# Patient Record
Sex: Male | Born: 1960 | Race: Black or African American | Hispanic: No | Smoking: Former smoker
Health system: Southern US, Community
[De-identification: ages and names within clinical notes are randomized; demographics above are authoritative.]

## PROBLEM LIST (undated history)

## (undated) DIAGNOSIS — K7682 Hepatic encephalopathy: Secondary | ICD-10-CM

## (undated) DIAGNOSIS — K7460 Unspecified cirrhosis of liver: Secondary | ICD-10-CM

## (undated) DIAGNOSIS — I629 Nontraumatic intracranial hemorrhage, unspecified: Secondary | ICD-10-CM

## (undated) DIAGNOSIS — I10 Essential (primary) hypertension: Secondary | ICD-10-CM

## (undated) DIAGNOSIS — K7290 Hepatic failure, unspecified without coma: Secondary | ICD-10-CM

## (undated) DIAGNOSIS — R569 Unspecified convulsions: Secondary | ICD-10-CM

---

## 2016-05-16 ENCOUNTER — Emergency Department
Admission: EM | Admit: 2016-05-16 | Discharge: 2016-05-17 | Disposition: A | Discharge status: Other Acute Inpatient Hospital | Payer: Medicaid Other | Attending: Emergency Medicine | Admitting: Emergency Medicine

## 2016-05-16 ENCOUNTER — Emergency Department: Payer: Medicaid Other

## 2016-05-16 DIAGNOSIS — G939 Disorder of brain, unspecified: Secondary | ICD-10-CM | POA: Diagnosis not present

## 2016-05-16 DIAGNOSIS — R569 Unspecified convulsions: Secondary | ICD-10-CM | POA: Insufficient documentation

## 2016-05-16 DIAGNOSIS — Z79899 Other long term (current) drug therapy: Secondary | ICD-10-CM | POA: Diagnosis not present

## 2016-05-16 DIAGNOSIS — I10 Essential (primary) hypertension: Secondary | ICD-10-CM | POA: Insufficient documentation

## 2016-05-16 DIAGNOSIS — J9600 Acute respiratory failure, unspecified whether with hypoxia or hypercapnia: Secondary | ICD-10-CM | POA: Diagnosis not present

## 2016-05-16 DIAGNOSIS — R4182 Altered mental status, unspecified: Secondary | ICD-10-CM | POA: Diagnosis not present

## 2016-05-16 DIAGNOSIS — R55 Syncope and collapse: Secondary | ICD-10-CM | POA: Diagnosis present

## 2016-05-16 HISTORY — DX: Essential (primary) hypertension: I10

## 2016-05-16 LAB — URINALYSIS COMPLETE WITH MICROSCOPIC (ARMC ONLY)
Bilirubin Urine: NEGATIVE
Glucose, UA: NEGATIVE mg/dL
KETONES UR: NEGATIVE mg/dL
Nitrite: NEGATIVE
PROTEIN: 30 mg/dL — AB
SPECIFIC GRAVITY, URINE: 1.011 (ref 1.005–1.030)
SQUAMOUS EPITHELIAL / LPF: NONE SEEN
pH: 7 (ref 5.0–8.0)

## 2016-05-16 LAB — SALICYLATE LEVEL

## 2016-05-16 LAB — COMPREHENSIVE METABOLIC PANEL
ALBUMIN: 1.9 g/dL — AB (ref 3.5–5.0)
ALK PHOS: 61 U/L (ref 38–126)
ALT: 33 U/L (ref 17–63)
ANION GAP: 6 (ref 5–15)
AST: 61 U/L — ABNORMAL HIGH (ref 15–41)
BILIRUBIN TOTAL: 4.4 mg/dL — AB (ref 0.3–1.2)
BUN: 13 mg/dL (ref 6–20)
CALCIUM: 8 mg/dL — AB (ref 8.9–10.3)
CO2: 16 mmol/L — ABNORMAL LOW (ref 22–32)
Chloride: 114 mmol/L — ABNORMAL HIGH (ref 101–111)
Creatinine, Ser: 0.87 mg/dL (ref 0.61–1.24)
GFR calc non Af Amer: >60 mL/min (ref >60)
Glucose, Bld: 103 mg/dL — ABNORMAL HIGH (ref 65–99)
POTASSIUM: 4.4 mmol/L (ref 3.5–5.1)
Sodium: 136 mmol/L (ref 135–145)
TOTAL PROTEIN: 6.8 g/dL (ref 6.5–8.1)

## 2016-05-16 LAB — CBC WITH DIFFERENTIAL/PLATELET
Basophils Absolute: 0.1 10*3/uL (ref 0–0.1)
EOS ABS: 0.2 10*3/uL (ref 0–0.7)
Eosinophils Relative: 2 %
HEMATOCRIT: 23.8 % — AB (ref 40.0–52.0)
HEMOGLOBIN: 7.5 g/dL — AB (ref 13.0–18.0)
LYMPHS ABS: 1.5 10*3/uL (ref 1.0–3.6)
Lymphocytes Relative: 18 %
MCH: 27.5 pg (ref 26.0–34.0)
MCHC: 31.6 g/dL — AB (ref 32.0–36.0)
MCV: 86.8 fL (ref 80.0–100.0)
MONO ABS: 1 10*3/uL (ref 0.2–1.0)
NEUTROS ABS: 5.7 10*3/uL (ref 1.4–6.5)
Platelets: 147 10*3/uL — ABNORMAL LOW (ref 150–440)
RBC: 2.74 MIL/uL — ABNORMAL LOW (ref 4.40–5.90)
RDW: 25.3 % — AB (ref 11.5–14.5)
WBC: 8.3 10*3/uL (ref 3.8–10.6)

## 2016-05-16 LAB — BLOOD GAS, ARTERIAL
ACID-BASE DEFICIT: 8.2 mmol/L — AB (ref 0.0–2.0)
Bicarbonate: 16.6 mEq/L — ABNORMAL LOW (ref 21.0–28.0)
FIO2: 0.5
MECHANICAL RATE: 16
O2 Saturation: 98.2 %
PEEP/CPAP: 5 cmH2O
PH ART: 7.35 (ref 7.350–7.450)
Patient temperature: 37
VT: 500 mL
pCO2 arterial: 30 mmHg — ABNORMAL LOW (ref 32.0–48.0)
pO2, Arterial: 113 mmHg — ABNORMAL HIGH (ref 83.0–108.0)

## 2016-05-16 LAB — PROTIME-INR
INR: 2.15
PROTHROMBIN TIME: 23.8 s — AB (ref 11.4–15.0)

## 2016-05-16 LAB — ETHANOL: Alcohol, Ethyl (B): <5 mg/dL (ref <5)

## 2016-05-16 LAB — LACTIC ACID, PLASMA: LACTIC ACID, VENOUS: 3.1 mmol/L — AB (ref 0.5–2.0)

## 2016-05-16 LAB — BRAIN NATRIURETIC PEPTIDE: B NATRIURETIC PEPTIDE 5: 38 pg/mL (ref 0.0–100.0)

## 2016-05-16 MED ORDER — SUCCINYLCHOLINE CHLORIDE 20 MG/ML IJ SOLN
100.0000 mg | Freq: Once | INTRAMUSCULAR | Status: AC
  Administered 2016-05-16: 100 mg via INTRAVENOUS

## 2016-05-16 MED ORDER — PROPOFOL 1000 MG/100ML IV EMUL
INTRAVENOUS | Status: AC
  Filled 2016-05-16: qty 100

## 2016-05-16 MED ORDER — SODIUM CHLORIDE 0.9 % IV SOLN
Freq: Once | INTRAVENOUS | Status: AC
  Administered 2016-05-16: 21:00:00 via INTRAVENOUS

## 2016-05-16 MED ORDER — FENTANYL 2500MCG IN NS 250ML (10MCG/ML) PREMIX INFUSION
25.0000 ug/h | INTRAVENOUS | Status: DC
  Administered 2016-05-16: 25 ug/h via INTRAVENOUS

## 2016-05-16 MED ORDER — MIDAZOLAM HCL 5 MG/ML IJ SOLN
1.0000 mg/h | INTRAMUSCULAR | Status: DC
  Administered 2016-05-16: 1 mg/h via INTRAVENOUS
  Filled 2016-05-16: qty 10

## 2016-05-16 MED ORDER — ETOMIDATE 2 MG/ML IV SOLN
20.0000 mg | Freq: Once | INTRAVENOUS | Status: AC
  Administered 2016-05-16: 20 mg via INTRAVENOUS

## 2016-05-16 MED ORDER — FENTANYL 2500MCG IN NS 250ML (10MCG/ML) PREMIX INFUSION
INTRAVENOUS | Status: AC
  Administered 2016-05-16: 25 ug/h via INTRAVENOUS
  Filled 2016-05-16: qty 250

## 2016-05-16 MED ORDER — SODIUM CHLORIDE 0.9 % IV SOLN
1000.0000 mg | Freq: Once | INTRAVENOUS | Status: AC
  Administered 2016-05-16: 1000 mg via INTRAVENOUS
  Filled 2016-05-16: qty 10

## 2016-05-16 MED ORDER — PROPOFOL 1000 MG/100ML IV EMUL
INTRAVENOUS | Status: AC
  Administered 2016-05-16: 10 ug/kg/min via INTRAVENOUS
  Filled 2016-05-16: qty 100

## 2016-05-16 MED ORDER — PROPOFOL 1000 MG/100ML IV EMUL
5.0000 ug/kg/min | Freq: Once | INTRAVENOUS | Status: AC
  Administered 2016-05-16: 10 ug/kg/min via INTRAVENOUS

## 2016-05-16 MED ORDER — SODIUM CHLORIDE 0.9 % IV BOLUS (SEPSIS)
1000.0000 mL | Freq: Once | INTRAVENOUS | Status: AC
  Administered 2016-05-16: 1000 mL via INTRAVENOUS

## 2016-05-16 NOTE — ED Notes (Signed)
Report called to 2M05

## 2016-05-16 NOTE — ED Provider Notes (Signed)
[facility] Emergency Department Provider Note   ____________________________________________  Time seen: Approximately 8:00 PM  I have reviewed the triage vital signs and the nursing notes.   HISTORY  Chief Complaint unresponsive     HPI Jon Morgan is a 55 y.o. male who had an episode of possible seizure and when he was brought into the emergency department he had blood coming from his mouth and was unresponsive. Apparently patient was initially awake and became unresponsive on the way per EMS. EMS also stated the patient was having periods of apnea. We don't have any other significant history and patient cannot provide any other information or complaints secondary to patient being unresponsive.   Past Medical History  Diagnosis Date  . Hypertension     There are no active problems to display for this patient.   No past surgical history on file.  Current Outpatient Rx  Name  Route  Sig  Dispense  Refill  . acetaminophen (TYLENOL) 650 MG CR tablet   Oral   Take 650 mg by mouth every 4 (four) hours as needed for pain. (do not exceed 3 grams in 24 hours)         . amLODipine (NORVASC) 10 MG tablet   Oral   Take 10 mg by mouth daily.         . fluticasone (FLONASE) 50 MCG/ACT nasal spray   Each Nare   Place 1 spray into both nostrils daily.         . Multiple Vitamin (MULTIVITAMIN WITH MINERALS) TABS tablet   Oral   Take 1 tablet by mouth daily.         Marland Kitchen omeprazole (PRILOSEC) 20 MG capsule   Oral   Take 20 mg by mouth daily.         . potassium chloride SA (K-DUR,KLOR-CON) 20 MEQ tablet   Oral   Take 20 mEq by mouth daily.         Marland Kitchen thiamine (VITAMIN B-1) 100 MG tablet   Oral   Take 100 mg by mouth daily.           Allergies Review of patient's allergies indicates no known allergies.  No family history on file.  Social History Social History  Substance Use Topics  . Smoking status: None  . Smokeless  tobacco: None  . Alcohol Use: None    Review of Systems Patient unable to give his any other significant review of systems secondary to his altered mental status/unresponsive 10-point ROS otherwise negative.  ____________________________________________   PHYSICAL EXAM:  VITAL SIGNS: ED Triage Vitals  Enc Vitals Group     BP 05/16/16 2000 154/83 mmHg     Pulse Rate 05/16/16 2015 108     Resp 05/16/16 2000 17     Temp 05/16/16 2023 100.3 F (37.9 C)     Temp Source 05/16/16 2023 Rectal     SpO2 05/16/16 2015 87 %     Weight 05/16/16 2001 205 lb 14.6 oz (93.4 kg)     Height --      Head Cir --      Peak Flow --      Pain Score --      Pain Loc --      Pain Edu? --      Excl. in GC? --     Constitutional: Unresponsive even to deep painful stimuli. Patient appears to be breathing fine and protecting his airways some time, and then at  times he is having periods of apnea. tressEyes: Conjunctivae are normal. PERRL. EOMI. Head: Atraumatic. The patient has blood coming from his mouth from where he bit his tongue. n. Nose: No congestion/rhinnorhea. Mouth/Throat: Mucous membranes are moist.  Oropharynx non-erythematous.Patient has very poor dentitio Neck: No stridor.   Cardiovascular: Patient is tachycardic. Grossly normal heart sounds.  Good peripheral circulation. Respiratory: Patient with deep sonorous respirations and periods of apnea with obvious congestion Gastrointestinal: Soft  No distention. No abdominal bruits. No CVA tenderness. Musculoskeletal: No lower extremity tenderness but has some mild edema.  No joint effusions. Neurologic: Recent unresponsive even to deep painful stimuli didn't but does respond to some deep suctioning of his mouth and throat. Skin:  Skin is warm, dry and intact. No rash noted. Psychiatric: Patient unresponsive and unable to assess  ____________________________________________   LABS (all labs ordered are listed, but only abnormal results are  displayed)  Labs Reviewed  BLOOD GAS, ARTERIAL - Abnormal; Notable for the following:    pCO2 arterial 30 (*)    pO2, Arterial 113 (*)    Bicarbonate 16.6 (*)    Acid-base deficit 8.2 (*)    All other components within normal limits  URINALYSIS COMPLETEWITH MICROSCOPIC ([facility] ONLY) - Abnormal; Notable for the following:    Color, Urine AMBER (*)    APPearance CLOUDY (*)    Hgb urine dipstick 1+ (*)    Protein, ur 30 (*)    Leukocytes, UA 3+ (*)    Bacteria, UA RARE (*)    All other components within normal limits  LACTIC ACID, PLASMA - Abnormal; Notable for the following:    Lactic Acid, Venous 3.1 (*)    All other components within normal limits  CBC WITH DIFFERENTIAL/PLATELET - Abnormal; Notable for the following:    RBC 2.74 (*)    Hemoglobin 7.5 (*)    HCT 23.8 (*)    MCHC 31.6 (*)    RDW 25.3 (*)    Platelets 147 (*)    All other components within normal limits  COMPREHENSIVE METABOLIC PANEL - Abnormal; Notable for the following:    Chloride 114 (*)    CO2 16 (*)    Glucose, Bld 103 (*)    Calcium 8.0 (*)    Albumin 1.9 (*)    AST 61 (*)    Total Bilirubin 4.4 (*)    All other components within normal limits  PROTIME-INR - Abnormal; Notable for the following:    Prothrombin Time 23.8 (*)    All other components within normal limits  CULTURE, BLOOD (ROUTINE X 2)  CULTURE, BLOOD (ROUTINE X 2)  URINE CULTURE  BRAIN NATRIURETIC PEPTIDE  ETHANOL  SALICYLATE LEVEL  CBC WITH DIFFERENTIAL/PLATELET  AMMONIA  LACTIC ACID, PLASMA  DRUG SCREEN 10 W/CONF, SERUM   ____________________________________________  EKG ED ECG REPORT I, Leona Carry, the attending physician, personally viewed and interpreted this ECG.   Date: 05/16/2016  EKG Time: 2001  Rate: 109  Rhythm: sinus tachycardia  Axis: Left axis deviation  Intervals:left anterior fascicular block  ST&T Change: Nonspecific ST changes   ____________________________________________  RADIOLOGY Dg Chest  1 View  05/16/2016  CLINICAL DATA:  Seizure, unresponsive EXAM: CHEST 1 VIEW COMPARISON:  None. FINDINGS: Patient has been intubated with endotracheal tube about 3 cm above the carina. Heart size upper normal. Vascular pattern normal. Right lung clear. Hazy density left lung base. IMPRESSION: Hazy attenuation left lung base could reflect pleural effusion and/or consolidation. Possibilities include aspiration pneumonitis.  Electronically Signed   By: Esperanza Heiraymond  Rubner M.D.   On: 05/16/2016 20:56   Ct Head Wo Contrast  05/16/2016  CLINICAL DATA:  Seizure, unresponsive. EXAM: CT HEAD WITHOUT CONTRAST TECHNIQUE: Contiguous axial images were obtained from the base of the skull through the vertex without intravenous contrast. COMPARISON:  None. FINDINGS: Brain: Ill-defined low-density focus is seen within the upper right frontal lobe, near the vertex, measuring approximately 3 cm greatest dimension, with local mass effect on adjacent sulci. No midline shift or herniation. No other parenchymal lesion identified. There is mild generalized brain atrophy with commensurate dilatation of the ventricles and sulci. No parenchymal or extra-axial hemorrhage seen. Vascular: No hyperdense vessel or unexpected calcification. There are chronic calcified atherosclerotic changes of the large vessels at the skull base. Skull: Negative for fracture or focal lesion. Sinuses/Orbits: No acute findings. Other: None. IMPRESSION: 1. Ill-defined low-density focus in the upper right frontal lobe, near the vertex, measuring approximately 3 cm greatest dimension. Associated local mass effect on adjacent sulci but no midline shift or herniation. Favor subacute infarction with associated edema. Neoplastic lesion cannot be confidently excluded. Consider MRI for further characterization. 2. No intracranial hemorrhage. These results were called by telephone at the time of interpretation on 05/16/2016 at 9:46 pm to Dr. Suella BroadLINDA Mckenna Gamm , who verbally  acknowledged these results. Electronically Signed   By: Bary RichardStan  Maynard M.D.   On: 05/16/2016 21:47     ____________________________________________   PROCEDURES  Procedure(s) performed: intubation, see procedure note(s).  INTUBATION Performed by: Leona Carryaylor,  Jesusa Stenerson M  Required items: required blood products, implants, devices, and special equipment available Patient identity confirmed: provided demographic data and hospital-assigned identification number Time out: Immediately prior to procedure a "time out" was called to verify the correct patient, procedure, equipment, support staff and site/side marked as required.  Indications: Altered mental status, inability to protect his airway   Intubation method: Direct Laryngoscopy   Preoxygenation: Under percent BVM  Sedatives: 20 mg Etomidate Paralytic: 100 mg uccinylcholine  Tube Size: 8 cuffed  Post-procedure assessment: chest rise and ETCO2 monitor Breath sounds: equal and absent over the epigastrium Tube secured with: ETT holder Chest x-ray interpreted by radiologist and me.  Chest x-ray findings: endotracheal tube in appropriate position  Patient tolerated the procedure well with no immediate complications.    Critical Care performed: Yes, see critical care note(s) CRITICAL CARE Performed by: Leona Carryaylor,  Haya Hemler M   Total critical care time: 60 minutes  Critical care time was exclusive of separately billable procedures and treating other patients.  Critical care was necessary to treat or prevent imminent or life-threatening deterioration.  Critical care was time spent personally by me on the following activities: development of treatment plan with patient and/or surrogate as well as nursing, discussions with consultants, evaluation of patient's response to treatment, examination of patient, obtaining history from patient or surrogate, ordering and performing treatments and interventions, ordering and review of laboratory  studies, ordering and review of radiographic studies, pulse oximetry and re-evaluation of patient's condition.  ____________________________________________   INITIAL IMPRESSION / ASSESSMENT AND PLAN / ED COURSE  Pertinent labs & imaging results that were available during my care of the patient were reviewed by me and considered in my medical decision making (see chart for details). 11:13 PM Patient had to be emergently intubated secondary to inability to protect his airway. At times patient's sats would drop into the 60s when he would have his apneic episodes. Patient was given etomidate and succinylcholine for sedation  since his teeth were clenched. Once intubated and good breath sounds bilaterally, patient had NG tube and Foley placed. Patient was getting all routine labs including blood cultures urine cultures and sepsis workup as well. Patient will get CT head secondary to questionable seizure and altered mental status. Patient was also tachycardic and started on IV fluid bolus. Patient's rectal temp was 100.1. Patient will be started on propofol infusion for sedation.  11:13 PM We'll attempt to placed patient on fentanyl & because even maxed out on propofol and does not sedating the patient. Once patient was intubated he started becoming much more reactive. CT head preliminary per the radiologist showed patient had a subcutaneous infarct versus a mass in his right frontal lobe. He suggested that we get an MRI for further evaluation. MRI was called in for evaluation. Once we saw others were small mass with edema, patient was given IV Keppra for possible preventable seizure especially since we think he had a seizure prior to arrival.  11:13 PM Patient will be changed her to the ICU at [facility] secondary to the fact that he needs to be evaluated by neurology and neurosurgery to decipher whether this lesion on the brain is a stroke or a tumor, and we do not have the capability of doing an MRI on  patients on a vent. Dr.Dediosx at [facility] ICU accepted the patient.  Patient will get a repeat ABG after being on the bed for the past hour and he has been stable as far as his vitals are concerned. We initially tried to sedate the patient with propofol but he still was fighting the vent therefore we changed to fentanyl and Versed. ____________________________________________   FINAL CLINICAL IMPRESSION(S) / ED DIAGNOSES  Final diagnoses:  Seizure (HCC)  Altered mental status, unspecified altered mental status type  Acute respiratory failure, unspecified whether with hypoxia or hypercapnia (HCC)  Brain lesion      NEW MEDICATIONS STARTED DURING THIS VISIT:  New Prescriptions   No medications on file     Note:  This document was prepared using Dragon voice recognition software and may include unintentional dictation errors.    Leona Carry, MD 05/16/16 [phone]

## 2016-05-16 NOTE — ED Notes (Signed)
Pt from [facility], EMS was called out for seizure, when they arrived pt was foaming at the mouth and unresponsive. Reports pt became apneic as well in the truck. Pt unresponsive upon assessment

## 2016-05-16 NOTE — ED Notes (Signed)
MD Ladona Ridgelaylor at bedside, 20 Etomidate and 100 succ pushed. Intubation completed. 24 at the lip, successful color change

## 2016-05-16 NOTE — ED Notes (Signed)
Pharmacy called for vesed drip

## 2016-05-16 NOTE — ED Notes (Signed)
Pt began to desat to the 60s on 6L [place], placed on non-rebreather

## 2016-05-16 NOTE — ED Notes (Signed)
Pt not tolerating ventilator, EDP made aware and verbal orders received for fentanyl and versed drip

## 2016-05-17 ENCOUNTER — Inpatient Hospital Stay (HOSPITAL_COMMUNITY): Payer: Medicaid Other

## 2016-05-17 ENCOUNTER — Inpatient Hospital Stay (HOSPITAL_COMMUNITY)
Admission: AD | Admit: 2016-05-17 | Discharge: 2016-05-25 | DRG: 064 | Disposition: A | Discharge status: Home / Self Care | Payer: Medicaid Other | Source: Other Acute Inpatient Hospital | Attending: Internal Medicine | Admitting: Internal Medicine

## 2016-05-17 DIAGNOSIS — E878 Other disorders of electrolyte and fluid balance, not elsewhere classified: Secondary | ICD-10-CM | POA: Diagnosis present

## 2016-05-17 DIAGNOSIS — E872 Acidosis: Secondary | ICD-10-CM | POA: Diagnosis present

## 2016-05-17 DIAGNOSIS — D509 Iron deficiency anemia, unspecified: Secondary | ICD-10-CM | POA: Diagnosis not present

## 2016-05-17 DIAGNOSIS — G9340 Encephalopathy, unspecified: Secondary | ICD-10-CM | POA: Diagnosis not present

## 2016-05-17 DIAGNOSIS — G40409 Other generalized epilepsy and epileptic syndromes, not intractable, without status epilepticus: Secondary | ICD-10-CM | POA: Diagnosis present

## 2016-05-17 DIAGNOSIS — K7460 Unspecified cirrhosis of liver: Secondary | ICD-10-CM | POA: Insufficient documentation

## 2016-05-17 DIAGNOSIS — L89611 Pressure ulcer of right heel, stage 1: Secondary | ICD-10-CM | POA: Diagnosis not present

## 2016-05-17 DIAGNOSIS — I8500 Esophageal varices without bleeding: Secondary | ICD-10-CM | POA: Diagnosis not present

## 2016-05-17 DIAGNOSIS — G40901 Epilepsy, unspecified, not intractable, with status epilepticus: Secondary | ICD-10-CM | POA: Diagnosis not present

## 2016-05-17 DIAGNOSIS — Z6831 Body mass index (BMI) 31.0-31.9, adult: Secondary | ICD-10-CM | POA: Diagnosis not present

## 2016-05-17 DIAGNOSIS — D6959 Other secondary thrombocytopenia: Secondary | ICD-10-CM | POA: Diagnosis present

## 2016-05-17 DIAGNOSIS — Z4659 Encounter for fitting and adjustment of other gastrointestinal appliance and device: Secondary | ICD-10-CM | POA: Diagnosis not present

## 2016-05-17 DIAGNOSIS — G939 Disorder of brain, unspecified: Secondary | ICD-10-CM | POA: Diagnosis not present

## 2016-05-17 DIAGNOSIS — K766 Portal hypertension: Secondary | ICD-10-CM | POA: Diagnosis present

## 2016-05-17 DIAGNOSIS — R1310 Dysphagia, unspecified: Secondary | ICD-10-CM | POA: Diagnosis not present

## 2016-05-17 DIAGNOSIS — Z7951 Long term (current) use of inhaled steroids: Secondary | ICD-10-CM

## 2016-05-17 DIAGNOSIS — E46 Unspecified protein-calorie malnutrition: Secondary | ICD-10-CM | POA: Diagnosis present

## 2016-05-17 DIAGNOSIS — B1920 Unspecified viral hepatitis C without hepatic coma: Secondary | ICD-10-CM | POA: Diagnosis present

## 2016-05-17 DIAGNOSIS — I6330 Cerebral infarction due to thrombosis of unspecified cerebral artery: Secondary | ICD-10-CM | POA: Insufficient documentation

## 2016-05-17 DIAGNOSIS — N390 Urinary tract infection, site not specified: Secondary | ICD-10-CM | POA: Diagnosis not present

## 2016-05-17 DIAGNOSIS — E876 Hypokalemia: Secondary | ICD-10-CM | POA: Diagnosis not present

## 2016-05-17 DIAGNOSIS — E873 Alkalosis: Secondary | ICD-10-CM | POA: Diagnosis present

## 2016-05-17 DIAGNOSIS — D731 Hypersplenism: Secondary | ICD-10-CM | POA: Diagnosis present

## 2016-05-17 DIAGNOSIS — N179 Acute kidney failure, unspecified: Secondary | ICD-10-CM | POA: Diagnosis not present

## 2016-05-17 DIAGNOSIS — E871 Hypo-osmolality and hyponatremia: Secondary | ICD-10-CM | POA: Diagnosis not present

## 2016-05-17 DIAGNOSIS — I426 Alcoholic cardiomyopathy: Secondary | ICD-10-CM | POA: Diagnosis present

## 2016-05-17 DIAGNOSIS — L8990 Pressure ulcer of unspecified site, unspecified stage: Secondary | ICD-10-CM | POA: Diagnosis not present

## 2016-05-17 DIAGNOSIS — L89153 Pressure ulcer of sacral region, stage 3: Secondary | ICD-10-CM | POA: Diagnosis present

## 2016-05-17 DIAGNOSIS — E669 Obesity, unspecified: Secondary | ICD-10-CM | POA: Diagnosis not present

## 2016-05-17 DIAGNOSIS — K921 Melena: Secondary | ICD-10-CM | POA: Diagnosis not present

## 2016-05-17 DIAGNOSIS — I639 Cerebral infarction, unspecified: Secondary | ICD-10-CM | POA: Diagnosis present

## 2016-05-17 DIAGNOSIS — I619 Nontraumatic intracerebral hemorrhage, unspecified: Secondary | ICD-10-CM | POA: Insufficient documentation

## 2016-05-17 DIAGNOSIS — K922 Gastrointestinal hemorrhage, unspecified: Secondary | ICD-10-CM | POA: Diagnosis not present

## 2016-05-17 DIAGNOSIS — K7030 Alcoholic cirrhosis of liver without ascites: Secondary | ICD-10-CM | POA: Diagnosis present

## 2016-05-17 DIAGNOSIS — I69954 Hemiplegia and hemiparesis following unspecified cerebrovascular disease affecting left non-dominant side: Secondary | ICD-10-CM | POA: Diagnosis not present

## 2016-05-17 DIAGNOSIS — D696 Thrombocytopenia, unspecified: Secondary | ICD-10-CM | POA: Diagnosis not present

## 2016-05-17 DIAGNOSIS — Z9119 Patient's noncompliance with other medical treatment and regimen: Secondary | ICD-10-CM | POA: Diagnosis not present

## 2016-05-17 DIAGNOSIS — D684 Acquired coagulation factor deficiency: Secondary | ICD-10-CM | POA: Diagnosis not present

## 2016-05-17 DIAGNOSIS — I6529 Occlusion and stenosis of unspecified carotid artery: Secondary | ICD-10-CM | POA: Diagnosis present

## 2016-05-17 DIAGNOSIS — D6489 Other specified anemias: Secondary | ICD-10-CM | POA: Diagnosis present

## 2016-05-17 DIAGNOSIS — B9620 Unspecified Escherichia coli [E. coli] as the cause of diseases classified elsewhere: Secondary | ICD-10-CM | POA: Diagnosis present

## 2016-05-17 DIAGNOSIS — K7290 Hepatic failure, unspecified without coma: Secondary | ICD-10-CM | POA: Diagnosis not present

## 2016-05-17 DIAGNOSIS — D649 Anemia, unspecified: Secondary | ICD-10-CM | POA: Diagnosis not present

## 2016-05-17 DIAGNOSIS — J9601 Acute respiratory failure with hypoxia: Secondary | ICD-10-CM | POA: Insufficient documentation

## 2016-05-17 DIAGNOSIS — K3189 Other diseases of stomach and duodenum: Secondary | ICD-10-CM | POA: Diagnosis present

## 2016-05-17 DIAGNOSIS — I10 Essential (primary) hypertension: Secondary | ICD-10-CM | POA: Diagnosis not present

## 2016-05-17 DIAGNOSIS — F1020 Alcohol dependence, uncomplicated: Secondary | ICD-10-CM | POA: Diagnosis not present

## 2016-05-17 DIAGNOSIS — R569 Unspecified convulsions: Secondary | ICD-10-CM

## 2016-05-17 DIAGNOSIS — I6789 Other cerebrovascular disease: Secondary | ICD-10-CM | POA: Diagnosis not present

## 2016-05-17 DIAGNOSIS — J9600 Acute respiratory failure, unspecified whether with hypoxia or hypercapnia: Secondary | ICD-10-CM | POA: Diagnosis not present

## 2016-05-17 DIAGNOSIS — E7800 Pure hypercholesterolemia, unspecified: Secondary | ICD-10-CM | POA: Diagnosis present

## 2016-05-17 LAB — BASIC METABOLIC PANEL
Anion gap: 5 (ref 5–15)
BUN: 11 mg/dL (ref 6–20)
CALCIUM: 8.1 mg/dL — AB (ref 8.9–10.3)
CO2: 16 mmol/L — AB (ref 22–32)
CREATININE: 0.71 mg/dL (ref 0.61–1.24)
Chloride: 115 mmol/L — ABNORMAL HIGH (ref 101–111)
Glucose, Bld: 98 mg/dL (ref 65–99)
Potassium: 4.4 mmol/L (ref 3.5–5.1)
SODIUM: 136 mmol/L (ref 135–145)

## 2016-05-17 LAB — CBC
HCT: 20.5 % — ABNORMAL LOW (ref 39.0–52.0)
Hemoglobin: 6.5 g/dL — CL (ref 13.0–17.0)
MCH: 26 pg (ref 26.0–34.0)
MCHC: 31.7 g/dL (ref 30.0–36.0)
MCV: 82 fL (ref 78.0–100.0)
PLATELETS: 138 10*3/uL — AB (ref 150–400)
RBC: 2.5 MIL/uL — ABNORMAL LOW (ref 4.22–5.81)
RDW: 23.4 % — AB (ref 11.5–15.5)
WBC: 8 10*3/uL (ref 4.0–10.5)

## 2016-05-17 LAB — RAPID URINE DRUG SCREEN, HOSP PERFORMED
AMPHETAMINES: NOT DETECTED
Barbiturates: NOT DETECTED
Benzodiazepines: POSITIVE — AB
Cocaine: NOT DETECTED
OPIATES: NOT DETECTED
Tetrahydrocannabinol: NOT DETECTED

## 2016-05-17 LAB — BLOOD GAS, ARTERIAL
Acid-base deficit: 7.6 mmol/L — ABNORMAL HIGH (ref 0.0–2.0)
BICARBONATE: 15.3 meq/L — AB (ref 20.0–24.0)
Drawn by: 449561
FIO2: 0.5
O2 Saturation: 100 %
PATIENT TEMPERATURE: 98.6
PCO2 ART: 20.3 mmHg — AB (ref 35.0–45.0)
PEEP: 5 cmH2O
PO2 ART: 254 mmHg — AB (ref 80.0–100.0)
RATE: 14 resp/min
TCO2: 15.9 mmol/L (ref 0–100)
VT: 620 mL
pH, Arterial: 7.489 — ABNORMAL HIGH (ref 7.350–7.450)

## 2016-05-17 LAB — MRSA PCR SCREENING: MRSA by PCR: NEGATIVE

## 2016-05-17 LAB — GLUCOSE, CAPILLARY
GLUCOSE-CAPILLARY: 104 mg/dL — AB (ref 65–99)
GLUCOSE-CAPILLARY: 86 mg/dL (ref 65–99)

## 2016-05-17 LAB — ABO/RH: ABO/RH(D): O POS

## 2016-05-17 LAB — PROCALCITONIN: Procalcitonin: <0.1 ng/mL

## 2016-05-17 LAB — LACTIC ACID, PLASMA
LACTIC ACID, VENOUS: 1.7 mmol/L (ref 0.5–2.0)
LACTIC ACID, VENOUS: 1.5 mmol/L (ref 0.5–2.0)
LACTIC ACID, VENOUS: 1.7 mmol/L (ref 0.5–2.0)
Lactic Acid, Venous: 2.2 mmol/L (ref 0.5–2.0)
Lactic Acid, Venous: 1.3 mmol/L (ref 0.5–2.0)

## 2016-05-17 LAB — HEMOGLOBIN AND HEMATOCRIT, BLOOD
HEMATOCRIT: 24 % — AB (ref 39.0–52.0)
Hemoglobin: 7.6 g/dL — ABNORMAL LOW (ref 13.0–17.0)

## 2016-05-17 LAB — PREPARE RBC (CROSSMATCH)

## 2016-05-17 LAB — PHOSPHORUS: PHOSPHORUS: 3.8 mg/dL (ref 2.5–4.6)

## 2016-05-17 LAB — MAGNESIUM: Magnesium: 1.8 mg/dL (ref 1.7–2.4)

## 2016-05-17 LAB — TROPONIN I

## 2016-05-17 MED ORDER — SODIUM CHLORIDE 0.9 % IV SOLN
25.0000 ug/h | INTRAVENOUS | Status: DC
  Administered 2016-05-17: 175 ug/h via INTRAVENOUS
  Filled 2016-05-17: qty 50

## 2016-05-17 MED ORDER — ANTISEPTIC ORAL RINSE SOLUTION (CORINZ)
7.0000 mL | Freq: Four times a day (QID) | OROMUCOSAL | Status: DC
  Administered 2016-05-17 – 2016-05-22 (×20): 7 mL via OROMUCOSAL

## 2016-05-17 MED ORDER — SODIUM CHLORIDE 0.9 % IV SOLN
Freq: Once | INTRAVENOUS | Status: AC
  Administered 2016-05-17: 06:00:00 via INTRAVENOUS

## 2016-05-17 MED ORDER — SODIUM CHLORIDE 0.9 % IV SOLN
500.0000 mg | Freq: Two times a day (BID) | INTRAVENOUS | Status: DC
  Filled 2016-05-17: qty 5

## 2016-05-17 MED ORDER — SODIUM CHLORIDE 0.9 % IV SOLN: 250.0000 mL | INTRAVENOUS | Status: DC | PRN

## 2016-05-17 MED ORDER — SODIUM CHLORIDE 0.9 % IV SOLN
1000.0000 mg | Freq: Two times a day (BID) | INTRAVENOUS | Status: DC
  Administered 2016-05-17 – 2016-05-25 (×17): 1000 mg via INTRAVENOUS
  Filled 2016-05-17 (×19): qty 10

## 2016-05-17 MED ORDER — THIAMINE HCL 100 MG/ML IJ SOLN
100.0000 mg | Freq: Every day | INTRAMUSCULAR | Status: DC
  Administered 2016-05-17 – 2016-05-24 (×8): 100 mg via INTRAVENOUS
  Filled 2016-05-17: qty 1
  Filled 2016-05-17: qty 2
  Filled 2016-05-17 (×3): qty 1
  Filled 2016-05-17 (×2): qty 2
  Filled 2016-05-17: qty 1

## 2016-05-17 MED ORDER — SODIUM CHLORIDE 0.9 % IV SOLN
INTRAVENOUS | Status: DC
  Administered 2016-05-17 – 2016-05-19 (×4): via INTRAVENOUS

## 2016-05-17 MED ORDER — ASPIRIN 81 MG PO CHEW
324.0000 mg | CHEWABLE_TABLET | Freq: Every day | ORAL | Status: DC
  Administered 2016-05-17: 324 mg via ORAL
  Filled 2016-05-17: qty 4

## 2016-05-17 MED ORDER — HEPARIN SODIUM (PORCINE) 5000 UNIT/ML IJ SOLN
5000.0000 [IU] | Freq: Three times a day (TID) | INTRAMUSCULAR | Status: DC
  Administered 2016-05-17 (×2): 5000 [IU] via SUBCUTANEOUS
  Filled 2016-05-17 (×3): qty 1

## 2016-05-17 MED ORDER — FOLIC ACID 5 MG/ML IJ SOLN
1.0000 mg | Freq: Every day | INTRAMUSCULAR | Status: DC
  Administered 2016-05-17 – 2016-05-24 (×8): 1 mg via INTRAVENOUS
  Filled 2016-05-17 (×10): qty 0.2

## 2016-05-17 MED ORDER — PANTOPRAZOLE SODIUM 40 MG IV SOLR
40.0000 mg | Freq: Every day | INTRAVENOUS | Status: DC
Start: 2016-05-17 — End: 2016-05-21
  Administered 2016-05-17 – 2016-05-20 (×5): 40 mg via INTRAVENOUS
  Filled 2016-05-17 (×5): qty 40

## 2016-05-17 MED ORDER — FENTANYL CITRATE (PF) 100 MCG/2ML IJ SOLN: 50.0000 ug | Freq: Once | INTRAMUSCULAR | Status: DC

## 2016-05-17 MED ORDER — LEVETIRACETAM 500 MG/5ML IV SOLN
500.0000 mg | Freq: Once | INTRAVENOUS | Status: AC
  Administered 2016-05-17: 500 mg via INTRAVENOUS
  Filled 2016-05-17: qty 5

## 2016-05-17 MED ORDER — FENTANYL BOLUS VIA INFUSION
50.0000 ug | INTRAVENOUS | Status: DC | PRN
  Administered 2016-05-17: 50 ug via INTRAVENOUS
  Filled 2016-05-17: qty 50

## 2016-05-17 MED ORDER — CHLORHEXIDINE GLUCONATE 0.12% ORAL RINSE (MEDLINE KIT)
15.0000 mL | Freq: Two times a day (BID) | OROMUCOSAL | Status: DC
  Administered 2016-05-17 – 2016-05-21 (×11): 15 mL via OROMUCOSAL

## 2016-05-17 MED ORDER — MIDAZOLAM HCL 5 MG/ML IJ SOLN
0.0000 mg/h | INTRAMUSCULAR | Status: DC
  Administered 2016-05-17: 2 mg/h via INTRAVENOUS
  Administered 2016-05-18: 4 mg/h via INTRAVENOUS
  Filled 2016-05-17 (×2): qty 10

## 2016-05-17 NOTE — Progress Notes (Signed)
Talked to patient's son on the phone and was very downgrading to his father and all of his medical conditions. Was not concerned with his father's condition and [facility] where he lives. Informed him that he would have to come up here to talk with MD regarding his dad's plan of care. Suzy Bouchardhompson, Gwenetta Devos E, RN 05/17/2016 5:04 AM

## 2016-05-17 NOTE — Consult Note (Signed)
WOC wound consult note Reason for Consult: Consult requested for sacrum and right heel. Pt is on the vent and unable to communicate, no family members at the bedside. Wound type: Right heel with deep tissue injury; 3X2cm darker-colored with intact skin Sacrum 4X3.5X.1cm, 90% red and dry, 10% yellow wound edges.  No odor or drainage.  Appearance is consistent with a healing stage 3 pressure injury. Pressure Ulcer POA: Yes Dressing procedure/placement/frequency: Foam dressing to protect and promote healing.  Prevalon boots are in place to reduce pressure to heels. Pt is on a Sport low-airloss bed to reduce pressure. Please re-consult if further assistance is needed.  Thank-you,  Cammie Mcgeeawn Luisdaniel Kenton MSN, RN, CWOCN, [place], CNS [phone]

## 2016-05-17 NOTE — Progress Notes (Signed)
Attempted sputum collection x 3 with unacceptable return amount.  Will attempt again at a later time.

## 2016-05-17 NOTE — H&P (Signed)
PULMONARY / CRITICAL CARE MEDICINE   Name: Jon Morgan MRN: [id number] DOB: 01/31/1961    ADMISSION DATE:  05/17/2016 CONSULTATION DATE:  05/17/16  REFERRING MD:  EDP at [facility]  CHIEF COMPLAINT:  Seizures  HISTORY OF PRESENT ILLNESS:  Pt is encephelopathic; therefore, this HPI is obtained from chart review. Jon Morgan is a 55 y.o. male with PMH of HTN. He experienced a seizure at home on 05/25 and was subsequently transported to [facility] ED. Per EMS, pt was initially awake but later became unresponsive en route and had periods of apnea. In ED, he remained unresponsive and was unable to protect airway; therefore, he was intubated by EDP.  He was then transferred to [facility] ICU for further workup and management.  PAST MEDICAL HISTORY :  He  has a past medical history of Hypertension.  PAST SURGICAL HISTORY: He  has no past surgical history on file.  No Known Allergies  No current facility-administered medications on file prior to encounter.   Current Outpatient Prescriptions on File Prior to Encounter  Medication Sig  . acetaminophen (TYLENOL) 650 MG CR tablet Take 650 mg by mouth every 4 (four) hours as needed for pain. (do not exceed 3 grams in 24 hours)  . amLODipine (NORVASC) 10 MG tablet Take 10 mg by mouth daily.  . fluticasone (FLONASE) 50 MCG/ACT nasal spray Place 1 spray into both nostrils daily.  . Multiple Vitamin (MULTIVITAMIN WITH MINERALS) TABS tablet Take 1 tablet by mouth daily.  Marland Kitchen omeprazole (PRILOSEC) 20 MG capsule Take 20 mg by mouth daily.  . potassium chloride SA (K-DUR,KLOR-CON) 20 MEQ tablet Take 20 mEq by mouth daily.  Marland Kitchen thiamine (VITAMIN B-1) 100 MG tablet Take 100 mg by mouth daily.    FAMILY HISTORY:  His has no family status information on file.   Limited as pt is intubated and no family around.   SOCIAL HISTORY: He    No information available. Pt is intubated.   REVIEW OF SYSTEMS:  Unable to obtain as pt is encephalopathic.  SUBJECTIVE:  On vent,  unresponsive.  VITAL SIGNS: Ht 6' (1.829 m)  Wt 212 lb 15.4 oz (96.6 kg)  BMI 28.88 kg/m2  HEMODYNAMICS:    VENTILATOR SETTINGS: Vent Mode:  [-] AC FiO2 (%):  [50 %] 50 % Set Rate:  [16 bmp] 16 bmp Vt Set:  [500 mL] 500 mL PEEP:  [5 cmH20] 5 cmH20  INTAKE / OUTPUT:     PHYSICAL EXAMINATION: General: Adult male, in NAD. Neuro: Sedated, not responsive. HEENT: [place]/AT. Pupils pinpoint and sluggish, mild sclerae icterus. Cardiovascular: RRR, no M/R/G.  Lungs: Respirations even and unlabored.  Coarse bilaterally. Abdomen: BS x 4, soft, NT/ND.  Musculoskeletal: No gross deformities, no edema.  Skin: Intact, warm, no rashes.  LABS:  BMET  Recent Labs Lab 05/16/16 2000  NA 136  K 4.4  CL 114*  CO2 16*  BUN 13  CREATININE 0.87  GLUCOSE 103*    Electrolytes  Recent Labs Lab 05/16/16 2000  CALCIUM 8.0*    CBC  Recent Labs Lab 05/16/16 2000  WBC 8.3  HGB 7.5*  HCT 23.8*  PLT 147*    Coag's  Recent Labs Lab 05/16/16 2000  INR 2.15    Sepsis Markers  Recent Labs Lab 05/16/16 2000  LATICACIDVEN 3.1*    ABG  Recent Labs Lab 05/16/16 2018  PHART 7.35  PCO2ART 30*  PO2ART 113*    Liver Enzymes  Recent Labs Lab 05/16/16 2000  AST 61*  ALT 33  ALKPHOS 61  BILITOT 4.4*  ALBUMIN 1.9*    Cardiac Enzymes No results for input(s): TROPONINI, PROBNP in the last 168 hours.  Glucose No results for input(s): GLUCAP in the last 168 hours.  Imaging Dg Chest 1 View  05/16/2016  CLINICAL DATA:  Seizure, unresponsive EXAM: CHEST 1 VIEW COMPARISON:  None. FINDINGS: Patient has been intubated with endotracheal tube about 3 cm above the carina. Heart size upper normal. Vascular pattern normal. Right lung clear. Hazy density left lung base. IMPRESSION: Hazy attenuation left lung base could reflect pleural effusion and/or consolidation. Possibilities include aspiration pneumonitis. Electronically Signed   By: Esperanza Heiraymond  Rubner M.D.   On: 05/16/2016  20:56   Ct Head Wo Contrast  05/16/2016  CLINICAL DATA:  Seizure, unresponsive. EXAM: CT HEAD WITHOUT CONTRAST TECHNIQUE: Contiguous axial images were obtained from the base of the skull through the vertex without intravenous contrast. COMPARISON:  None. FINDINGS: Brain: Ill-defined low-density focus is seen within the upper right frontal lobe, near the vertex, measuring approximately 3 cm greatest dimension, with local mass effect on adjacent sulci. No midline shift or herniation. No other parenchymal lesion identified. There is mild generalized brain atrophy with commensurate dilatation of the ventricles and sulci. No parenchymal or extra-axial hemorrhage seen. Vascular: No hyperdense vessel or unexpected calcification. There are chronic calcified atherosclerotic changes of the large vessels at the skull base. Skull: Negative for fracture or focal lesion. Sinuses/Orbits: No acute findings. Other: None. IMPRESSION: 1. Ill-defined low-density focus in the upper right frontal lobe, near the vertex, measuring approximately 3 cm greatest dimension. Associated local mass effect on adjacent sulci but no midline shift or herniation. Favor subacute infarction with associated edema. Neoplastic lesion cannot be confidently excluded. Consider MRI for further characterization. 2. No intracranial hemorrhage. These results were called by telephone at the time of interpretation on 05/16/2016 at 9:46 pm to Dr. Suella BroadLINDA TAYLOR , who verbally acknowledged these results. Electronically Signed   By: Bary RichardStan  Maynard M.D.   On: 05/16/2016 21:47     STUDIES:  CXR 05/25 > opacity in left base. CT head 05/25 > possible subacute infarction with associated edema. Neoplastic lesion can not be excluded. No hemorrhage. MRI brain 05/26 >  CULTURES: Blood 05/25 > Urine 05/25 > Sputum 05/26 >   ANTIBIOTICS: None.  SIGNIFICANT EVENTS: 05/26 > admitted with seizures, required intubation for airway protection.  LINES/TUBES: ETT  05/25 >  DISCUSSION: 55 y.o. M with hx of HTN, admitted 05/26 after grand mal seizure. Required intubation in ED due to inability to protect airway.  ASSESSMENT / PLAN:  NEUROLOGIC A:  Acute encephalopathy. Seizures. ? Subacute infarction vs neoplasm. P:  Sedation: Fentanyl gtt / Midazolam gtt. RASS goal: 0 to -1. Daily WUA. Continue Keppra. Additional AED's per neuro. EEG. Neurology consult. Assess MRI. Continue outpatient thiamine. Add folate - ? ETOH. Assess UDS.  PULMONARY A: VDRF - due to inability to protect airway secondary to seizure. Possible aspiration - not convinced. P:  Full vent support. Wean as able. VAP prevention measures. SBT in AM if able. CXR in AM.  CARDIOVASCULAR A:  Hx HTN. P:  Monitor hemodynamics. Hold outpatient amlodipine. Trend lactate, troponin.  RENAL A:  Pseudohypocalcemia - corrects to 9.68. P:  NS @ 100. Assess ionized calcium. BMP in AM.  GASTROINTESTINAL A:  GI prophylaxis. Nutrition. P:  SUP: Pantoprazole. NPO.  HEMATOLOGIC A:  Thrombocytopenia. VTE Prophylaxis. P:  Monitor platelet counts. SCD's / Heparin. CBC in AM.  INFECTIOUS A:  Possible aspiration - not convinced. P:  Defer abx for now. Follow cultures. Assess PCT, if high then start abx.  ENDOCRINE A:  No acute issues. P:  Monitor glucose on BMP.  Family updated: None available.  Interdisciplinary Family Meeting v Palliative Care Meeting:  Due by: 06/01.  CC time: 35 minutes.   Rutherford Guys, [place] [facility] [place] [facility] Pager: [phone]  or [phone] 05/17/2016, 1:10 AM   ATTENDING NOTE / ATTESTATION NOTE :   I have discussed the case with the resident/APP Rahul Desai.  I agree with the resident/APP's  history, physical examination, assessment, and plans.  I have edited the above note and modified it according to our agreed history, physical examination, assessment and  plan.   Briefly, pt was admitted for szes. Became unresponsive so he was intubated for airway protection. Cranial CT scan with 3 cm hypodensity at the R upper frontal lobe > Malignancy vs CVA. Transferred to [facility] for MRI.  VSS. Sedated. (-) lateralizing signs. CN grossly intact. Systemic PE U/R.  Neurology has been consulted > appreciate input.  Plan for MRI.  Keppra has been increased.   Panculture. Check PCT > if elevated plan for abx for asp pna.   I have spent 30 minutes of critical care time with this patient today.  Family :  No family at bedside.    Pollie Meyer, MD 05/17/2016, 2:34 AM [facility] Pager [phone] After 3 pm or if no answer, call [phone]

## 2016-05-17 NOTE — Progress Notes (Signed)
CRITICAL VALUE ALERT  Critical value received:  Hgb 6.5  Date of notification:  05/17/2016  Time of notification:  0238  Critical value read back:Yes.    Nurse who received alert:  Anastasia Pallee Dee Turner  MD notified (1st page):  Christene Slatese Dios  Time of first page:  0238  MD notified (2nd page):  Time of second page:  Responding MD:  Christene Slatese Dios  Time MD responded:  [id number]

## 2016-05-17 NOTE — Progress Notes (Signed)
Initial Nutrition Assessment  DOCUMENTATION CODES:   Not applicable  INTERVENTION:    If unable to extubate patient within the next 24 hours, recommend initiate TF via OGT with Vital AF 1.2 at goal rate of 80 ml/h (1920 ml per day) to provide 2304 kcals, 144 gm protein, 1557 ml free water daily.  NUTRITION DIAGNOSIS:   Inadequate oral intake related to inability to eat as evidenced by NPO status.  GOAL:   Patient will meet greater than or equal to 90% of their needs  MONITOR:   Vent status, Labs, Weight trends, I & O's, Skin  REASON FOR ASSESSMENT:   Ventilator    ASSESSMENT:   55 y.o. male with PMH of HTN. He experienced a seizure at home on 05/25 and was subsequently [facility].  Patient was intubated in the ED.  Patient remains intubated on ventilator support MV: 12.4 L/min Temp (24hrs), Avg:97.2 F (36.2 C), Min:93.8 F (34.3 C), Max:100.3 F (37.9 C)   Diet Order:  Diet NPO time specified  Skin:  Wound (see comment) (stage II to sacrum; DTI to heel)  Last BM:  unknown  Height:   Ht Readings from Last 1 Encounters:  05/17/16 6' (1.829 m)    Weight:   Wt Readings from Last 1 Encounters:  05/17/16 212 lb 15.4 oz (96.6 kg)    Ideal Body Weight:  80.9 kg  BMI:  Body mass index is 28.88 kg/(m^2).  Estimated Nutritional Needs:   Kcal:  2280  Protein:  130-145 gm  Fluid:  2.3 L  EDUCATION NEEDS:   No education needs identified at this time  Joaquin CourtsKimberly Harris, RD, LDN, CNSC Pager [phone] After Hours Pager [phone]

## 2016-05-17 NOTE — Progress Notes (Signed)
EEG Completed; Results Pending  

## 2016-05-17 NOTE — Progress Notes (Signed)
Chaplain visited with son of Patient and explain that we can not complete an AD at this time. Chaplain did give the family the AD paper work. However the son is requesting that if the Pt. Can answer the questions:    Pt.'s  Name:   Son's name:  Date of birth:    Please call the son. He wants to be able to talk to his Dad. Chaplain spoke to the Pt's son about end of life issues and the son's feeling about his father's goal of care.

## 2016-05-17 NOTE — ED Notes (Signed)
[facility] here to transport pt 

## 2016-05-17 NOTE — Progress Notes (Signed)
eLink Physician-Brief Progress Note Patient Name: Elaina PatteeWayne Jasper DOB: August 21, 1961 MRN: [id number]   Date of Service  05/17/2016  HPI/Events of Note  Hgb of 6.5 down from 7.5  eICU Interventions  Plan: Transfuse 1 unit pRBC Post-transfusion CBC     Intervention Category Intermediate Interventions: Other:  Veretta Sabourin 05/17/2016, 2:40 AM

## 2016-05-17 NOTE — Consult Note (Signed)
Admission H&P    Chief Complaint: Generalized seizure and unresponsive.  HPI: Jon Morgan is an 55 y.o. male history of hypertension was brought to the emergency room at [facility] following a witnessed generalized seizure at home. Patient has no history of seizure disorder. In route to the hospital he was noted to have experienced an episode of apnea. Patient had recurrent apnea in the ED and was subsequently intubated and placed on mechanical ventilation. He was also given Keppra 500 mg IV. Sedation was attempted with propofol but was unsuccessful. He was subsequently started on fentanyl and Versed IV. No further seizure activity has been reported. CT scan of his head showed a low density medial posterior right frontal lesion. Stroke was felt to be likely. However mass lesion cannot be ruled out. [facility]. He had a low-grade temp of 100.3. WBC count was normal. Hemoglobin was 7.5. Prothrombin time was elevated with an INR of 2.15. No anticoagulation agent was listed with his medications. AST was 61 and total bilirubin was 4.4. MRI of his brain is pending. Serum ammonia level is also pending.  Past Medical History  Diagnosis Date  . Hypertension     No past surgical history on file.  Family history: Unavailable due to patient's mental status.  Social History:  has no tobacco, alcohol, and drug history on file.  Allergies: No Known Allergies  Medications Prior to Admission  Medication Sig Dispense Refill  . acetaminophen (TYLENOL) 650 MG CR tablet Take 650 mg by mouth every 4 (four) hours as needed for pain. (do not exceed 3 grams in 24 hours)    . amLODipine (NORVASC) 10 MG tablet Take 10 mg by mouth daily.    . fluticasone (FLONASE) 50 MCG/ACT nasal spray Place 1 spray into both nostrils daily.    . Multiple Vitamin (MULTIVITAMIN WITH MINERALS) TABS tablet Take 1 tablet by mouth daily.    Marland Kitchen. omeprazole (PRILOSEC) 20 MG capsule Take 20 mg by mouth  daily.    . potassium chloride SA (K-DUR,KLOR-CON) 20 MEQ tablet Take 20 mEq by mouth daily.    Marland Kitchen. thiamine (VITAMIN B-1) 100 MG tablet Take 100 mg by mouth daily.      ROS: Unavailable due to patient's altered mental status.  Physical Examination: Blood pressure 104/74, pulse 91, resp. rate 15, height 6' (1.829 m), weight 96.6 kg (212 lb 15.4 oz), SpO2 100 %.  HEENT-  Normocephalic, no lesions, without obvious abnormality.  Normal external eye and conjunctiva.  Normal TM's bilaterally.  Normal auditory canals and external ears. Normal external nose, mucus membranes and septum.  Normal pharynx. Neck supple with no masses, nodes, nodules or enlargement. Cardiovascular - regular rate and rhythm, S1, S2 normal, no murmur, click, rub or gallop Lungs - chest clear, no wheezing, rales, normal symmetric air entry Abdomen - soft, non-tender; bowel sounds normal; no masses,  no organomegaly Extremities - no joint deformities, effusion, or inflammation and no edema  Neurologic Examination: Patient was intubated and on mechanical ventilation. Spontaneous respirations were noted. He was on sedation with fentanyl and Versed IV. There was no response to noxious stimuli. Pupils were very small and equal and reacted to light was indeterminate. Extraocular movements are absent with oculocephalic maneuvers. Face was symmetrical with no focal weakness Muscle tone was flaccid throughout. There were no spontaneous movements and no abnormal posturing. Deep tendon reflexes were 2+ and symmetrical. Plantar responses were mute.  Results for orders placed or performed during the  hospital encounter of 05/17/16 (from the past 48 hour(s))  Glucose, capillary     Status: Abnormal   Collection Time: 05/17/16  1:33 AM  Result Value Ref Range   Glucose-Capillary 104 (H) 65 - 99 mg/dL   Comment 1 Glucose Stabilizer    Dg Chest 1 View  05/16/2016  CLINICAL DATA:  Seizure, unresponsive EXAM: CHEST 1 VIEW COMPARISON:   None. FINDINGS: Patient has been intubated with endotracheal tube about 3 cm above the carina. Heart size upper normal. Vascular pattern normal. Right lung clear. Hazy density left lung base. IMPRESSION: Hazy attenuation left lung base could reflect pleural effusion and/or consolidation. Possibilities include aspiration pneumonitis. Electronically Signed   By: Esperanza Heir M.D.   On: 05/16/2016 20:56   Ct Head Wo Contrast  05/16/2016  CLINICAL DATA:  Seizure, unresponsive. EXAM: CT HEAD WITHOUT CONTRAST TECHNIQUE: Contiguous axial images were obtained from the base of the skull through the vertex without intravenous contrast. COMPARISON:  None. FINDINGS: Brain: Ill-defined low-density focus is seen within the upper right frontal lobe, near the vertex, measuring approximately 3 cm greatest dimension, with local mass effect on adjacent sulci. No midline shift or herniation. No other parenchymal lesion identified. There is mild generalized brain atrophy with commensurate dilatation of the ventricles and sulci. No parenchymal or extra-axial hemorrhage seen. Vascular: No hyperdense vessel or unexpected calcification. There are chronic calcified atherosclerotic changes of the large vessels at the skull base. Skull: Negative for fracture or focal lesion. Sinuses/Orbits: No acute findings. Other: None. IMPRESSION: 1. Ill-defined low-density focus in the upper right frontal lobe, near the vertex, measuring approximately 3 cm greatest dimension. Associated local mass effect on adjacent sulci but no midline shift or herniation. Favor subacute infarction with associated edema. Neoplastic lesion cannot be confidently excluded. Consider MRI for further characterization. 2. No intracranial hemorrhage. These results were called by telephone at the time of interpretation on 05/16/2016 at 9:46 pm to Dr. Suella Broad , who verbally acknowledged these results. Electronically Signed   By: Bary Richard M.D.   On: 05/16/2016 21:47     Assessment/Plan 55 year old man history of hypertension presenting with generalized seizure as well as periods of apnea requiring intubation. In addition, CT scan of the head shows a low density medial posterior right frontal lesion of unclear etiology. Stroke is suspected. However, mass lesion cannot be ruled out. Significance of elevated prothrombin time is unclear, but possibly related to liver dysfunction.  Recommendations: 1. Give additional 500 mg of Keppra IV stat, and increased maintenance dose to 1000 mg every 12 hours 2. MRI of the brain without and with contrast 3. EEG routine adult study  We will continue to follow this patient with you.  C.R. Roseanne Reno, MD [facility] [phone]  05/17/2016, 1:49 AM

## 2016-05-17 NOTE — Progress Notes (Signed)
eLink Physician-Brief Progress Note Patient Name: Jon PatteeWayne Morgan DOB: 05/23/1961 MRN: [id number]   Date of Service  05/17/2016  HPI/Events of Note  Resp alkalosis on vent  eICU Interventions  Reduce TV to 550 cc     Intervention Category Major Interventions: Acid-Base disturbance - evaluation and management  DETERDING,ELIZABETH 05/17/2016, 4:20 AM

## 2016-05-17 NOTE — Progress Notes (Signed)
Subjective: Sedated on fentanyl and versed  Exam: Filed Vitals:   05/17/16 0730 05/17/16 0849  BP: 99/61 102/61  Pulse: 66 78  Temp:    Resp: 20 15   Gen: In bed, intubated Resp: ventilated Abd: soft, nt  Limited by sedation.  Neuro: MS: does not open eyes or follow commands.  JY:NWGNFCN:PERRL, blinks to eyelid stim bialterally Motor: withdraws in the right arm and leg no movement left leg, possible mild withdrawal left arm.   Sensory:as above.   Pertinent Labs: Bmp - unremarkable  Impression: 55 yo M with new onset seizures in the setting of new mass vs stroke. He doe sappear weak on the right and I suspect ischemia more than mass(ACA territory). EEG without ongoing seizure.   Recommendations: 1) MRI brain 2) Will start asa 3) further stroke workup if MRI positive.   Jon SlotMcNeill Tannie Koskela, MD [facility] [phone]  If 7pm- 7am, please page neurology on call as listed in AMION.

## 2016-05-17 NOTE — Progress Notes (Signed)
CRITICAL VALUE ALERT  Critical value received:  Lactic 2.2  Date of notification:  05/17/2016  Time of notification:  0519  Critical value read back:Yes.    Nurse who received alert:  Livingston Dioneshristina Kelley  MD notified (1st page):  Deterding  Time of first page:  0519  MD notified (2nd page):  Time of second page:  Responding MD:  Deterding  Time MD responded:  [phone]

## 2016-05-17 NOTE — Procedures (Signed)
History:  55 year old male with new onset seizures in the setting of mass versus stroke  Sedation: Versed, fentanyl  Technique: This is a 21 channel routine scalp EEG performed at the bedside with bipolar and monopolar montages arranged in accordance to the international 10/20 system of electrode placement. One channel was dedicated to EKG recording.    Background: The background consists of low voltage irregular delta activity interspersed with occasional structures with ongoing sleep spindles. No posterior dominant rhythm was seen.  Photic stimulation: Physiologic driving is not performed  EEG Abnormalities: 1) generalized irregular slow activity  Clinical Interpretation: This EEG is consistent with a generalized non-specific cerebral dysfunction(encephalopathy). There was no seizure or seizure predisposition recorded on this study. Please note that a normal EEG does not preclude the possibility of epilepsy.   Ritta SlotMcNeill Val Farnam, MD [facility] [phone]  If 7pm- 7am, please page neurology on call as listed in AMION.

## 2016-05-17 NOTE — Progress Notes (Signed)
PULMONARY / CRITICAL CARE MEDICINE   Name: Jon Morgan MRN: [id number] DOB: 1961/02/19    ADMISSION DATE:  05/17/2016 CONSULTATION DATE:  05/17/16  REFERRING MD:  [facility] at [facility]  CHIEF COMPLAINT:  Seizures  HISTORY OF PRESENT ILLNESS:  Pt is encephelopathic; therefore, this HPI is obtained from chart review. Jon Morgan is a 55 y.o. male with PMH of HTN. He experienced a seizure at home on 05/25 and was subsequently transported [facility] ED. Per EMS, pt was initially awake but later became unresponsive en route and had periods of apnea. In ED, he remained unresponsive and was unable to protect airway; therefore, he was intubated by EDP. [facility] further workup and management.  SUBJECTIVE:  Transferred to our facility overnight for ongoing workup of seizures. No acute events since admission. 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  REVIEW OF SYSTEMS:  Unable to obtain as patient is intubated.  VITAL SIGNS: BP 112/75 mmHg  Pulse 66  Temp(Src) 93.8 F (34.3 C) (Rectal)  Resp 12  Ht 6' (1.829 m)  Wt 212 lb 15.4 oz (96.6 kg)  BMI 28.88 kg/m2  SpO2 100%  HEMODYNAMICS:    VENTILATOR SETTINGS: Vent Mode:  [-] PRVC FiO2 (%):  [50 %] 50 % Set Rate:  [14 bmp-16 bmp] 14 bmp Vt Set:  [500 mL-620 mL] 550 mL PEEP:  [5 cmH20] 5 cmH20 Plateau Pressure:  [13 cmH20] 13 cmH20  INTAKE / OUTPUT: I/O last 3 completed shifts: In: 752.7 [I.V.:292.7; Blood:335; Other:20; IV Piggyback:105] Out: 425 [Urine:425]   PHYSICAL EXAMINATION: General: Laying in bed. Son at bedside. No distress. Neuro: Sedated. Pupils symmetric. No withdrawal to pain. HEENT: No scleral injection or icterus. Endotracheal tube in place. Cardiovascular: Slightly tachycardic but regular rhythm. No appreciable JVD. Normal S1 & S2. Lungs: Coarse breath sounds bilaterally. Symmetric chest wall rise on ventilator. Abdomen: Soft. Nondistended. Normal bowel sounds. Integument:  Warm and dry. No rash on exposed skin. Patient does have decubitus ulcers noted.  LABS:  BMET  Recent Labs Lab 05/16/16 2000 05/17/16 0147  NA 136 136  K 4.4 4.4  CL 114* 115*  CO2 16* 16*  BUN 13 11  CREATININE 0.87 0.71  GLUCOSE 103* 98    Electrolytes  Recent Labs Lab 05/16/16 2000 05/17/16 0147  CALCIUM 8.0* 8.1*  MG  --  1.8  PHOS  --  3.8    CBC  Recent Labs Lab 05/16/16 2000 05/17/16 0147  WBC 8.3 8.0  HGB 7.5* 6.5*  HCT 23.8* 20.5*  PLT 147* 138*    Coag's  Recent Labs Lab 05/16/16 2000  INR 2.15    Sepsis Markers  Recent Labs Lab 05/16/16 2000 05/17/16 0147 05/17/16 0411  LATICACIDVEN 3.1* 1.7 2.2*  PROCALCITON  --  <0.10  --     ABG  Recent Labs Lab 05/16/16 2018 05/17/16 0350  PHART 7.35 7.489*  PCO2ART 30* 20.3*  PO2ART 113* 254*    Liver Enzymes  Recent Labs Lab 05/16/16 2000  AST 61*  ALT 33  ALKPHOS 61  BILITOT 4.4*  ALBUMIN 1.9*    Cardiac Enzymes  Recent Labs Lab 05/17/16 0147  TROPONINI <0.03    Glucose  Recent Labs Lab 05/17/16 0133  GLUCAP 104*    Imaging Dg Chest 1 View  05/16/2016  CLINICAL DATA:  Seizure, unresponsive EXAM: CHEST 1 VIEW COMPARISON:  None. FINDINGS: Patient has been intubated with endotracheal tube about 3 cm above  the carina. Heart size upper normal. Vascular pattern normal. Right lung clear. Hazy density left lung base. IMPRESSION: Hazy attenuation left lung base could reflect pleural effusion and/or consolidation. Possibilities include aspiration pneumonitis. Electronically Signed   By: Esperanza Heir M.D.   On: 05/16/2016 20:56   Ct Head Wo Contrast  05/16/2016  CLINICAL DATA:  Seizure, unresponsive. EXAM: CT HEAD WITHOUT CONTRAST TECHNIQUE: Contiguous axial images were obtained from the base of the skull through the vertex without intravenous contrast. COMPARISON:  None. FINDINGS: Brain: Ill-defined low-density focus is seen within the upper right frontal lobe, near  the vertex, measuring approximately 3 cm greatest dimension, with local mass effect on adjacent sulci. No midline shift or herniation. No other parenchymal lesion identified. There is mild generalized brain atrophy with commensurate dilatation of the ventricles and sulci. No parenchymal or extra-axial hemorrhage seen. Vascular: No hyperdense vessel or unexpected calcification. There are chronic calcified atherosclerotic changes of the large vessels at the skull base. Skull: Negative for fracture or focal lesion. Sinuses/Orbits: No acute findings. Other: None. IMPRESSION: 1. Ill-defined low-density focus in the upper right frontal lobe, near the vertex, measuring approximately 3 cm greatest dimension. Associated local mass effect on adjacent sulci but no midline shift or herniation. Favor subacute infarction with associated edema. Neoplastic lesion cannot be confidently excluded. Consider MRI for further characterization. 2. No intracranial hemorrhage. These results were called by telephone at the time of interpretation on 05/16/2016 at 9:46 pm to Dr. Suella Broad , who verbally acknowledged these results. Electronically Signed   By: Bary Richard M.D.   On: 05/16/2016 21:47   Dg Chest Port 1 View  05/17/2016  CLINICAL DATA:  Encounter for orogastric tube placement. EXAM: PORTABLE CHEST 1 VIEW COMPARISON:  Yesterday at 2036 hour FINDINGS: Endotracheal tube is 3.1 cm from the carina. Tip of the enteric tube in the region of the distal esophagus, side-port in the midesophagus. Cardiomediastinal contours are unchanged. Improving aeration at the left lung base with residual atelectasis. Lung volumes are low. No new abnormality is seen. IMPRESSION: 1. Enteric tube in the distal esophagus. Recommend advancement of at least 12 cm for optimal placement. 2. Improving left lung base aeration with residual atelectasis. Electronically Signed   By: Rubye Oaks M.D.   On: 05/17/2016 05:01     STUDIES:  CXR 05/25:  opacity in left base. CT head 05/25: possible subacute infarction with associated edema. Neoplastic lesion can not be excluded. No hemorrhage. Port CXR 5/26:  Mild left basilar opacity. ETT in good position. OGT in distal esophagus. MRI brain 05/26 >  MICROBIOLOGY: Blood Ctx 5/25 >> Urine Ctx 5/25 >> Trach Asp Ctx 5/26 >> MRSA PCR 5/26:  Negative    ANTIBIOTICS: None.  SIGNIFICANT EVENTS: 05/26 > admitted with seizures, required intubation for airway protection.  LINES/TUBES: OETT 8.0 05/25 > Foley 5/25 > OGT 5/25 > PIV x3  ASSESSMENT / PLAN:  NEUROLOGIC A:  Acute encephalopathy. Seizures ? Subacute infarction vs neoplasm.  P:  Goal RASS:  0 to -1 Versed gtt Fentanyl gtt Daily WUA. Continue Keppra. Additional AED's per neuro. EEG pending Neurology consult pending MRI brain pending Continue outpatient thiamine IV daily FA IV daily   PULMONARY A: Acute Respiratory Failure - Unable to protect airway. Possible Aspiration   P:  Full vent support. Wean as able. VAP prevention measures. SBT & WUA daily  CARDIOVASCULAR A:  H/O HTN.  P:  Monitor hemodynamics. Hold outpatient amlodipine. Vitals per unit protocol  RENAL A:  Lactic Acidosis - Mild. Pseudohypocalcemia - corrects to 9.68.  P:  NS @ 100. Ionized Calcium pending Trending LA q6hr Monitoring electrolytes & renal function daily Trending UOP  GASTROINTESTINAL A:  Nutrition.  P:  NPO Protonix IV qhs  HEMATOLOGIC A:  Anemia - No signs of active bleeding. S/P 1u PRBC 5/26. Thrombocytopenia - Mild. VTE Prophylaxis.  P:  Trending cell counts daily w/ CBC SCDs Heparin [place] q8hr  INFECTIOUS A:  Possible Aspiration   P:  Defer abx for now. Follow cultures. Monitor for fever  ENDOCRINE A:  No acute issues.  P:  Monitor glucose on BMP.  Family updated: Son at bedside & updated by Dr. Jamison Neighbor 5/26.  Interdisciplinary Family Meeting v Palliative  Care Meeting:  Due by: 06/01.  TODAY'S SUMMARY:  55 y.o. M with hx of HTN, admitted 05/26 after grand mal seizure. Required intubation in ED due to inability to protect airway.  I have spent an additional 42 minutes of critical care time today reviewing the patient's electronic medical record, discussing the plan of care at bedside with the patient's son, and caring for the patient.  Donna Christen Jamison Neighbor, M.D. [facility] Pulmonary & Critical Care Pager:  [phone] After 3pm or if no response, call [phone] 7:23 AM 05/17/2016

## 2016-05-18 ENCOUNTER — Inpatient Hospital Stay (HOSPITAL_COMMUNITY): Payer: Medicaid Other

## 2016-05-18 DIAGNOSIS — D649 Anemia, unspecified: Secondary | ICD-10-CM

## 2016-05-18 DIAGNOSIS — N390 Urinary tract infection, site not specified: Secondary | ICD-10-CM

## 2016-05-18 DIAGNOSIS — I6330 Cerebral infarction due to thrombosis of unspecified cerebral artery: Secondary | ICD-10-CM | POA: Insufficient documentation

## 2016-05-18 LAB — CBC WITH DIFFERENTIAL/PLATELET
Basophils Absolute: 0.1 10*3/uL (ref 0.0–0.1)
Basophils Relative: 1 %
EOS PCT: 4 %
Eosinophils Absolute: 0.4 10*3/uL (ref 0.0–0.7)
HCT: 20.4 % — ABNORMAL LOW (ref 39.0–52.0)
Hemoglobin: 6.5 g/dL — CL (ref 13.0–17.0)
Lymphocytes Relative: 23 %
Lymphs Abs: 2.2 10*3/uL (ref 0.7–4.0)
MCH: 27.3 pg (ref 26.0–34.0)
MCHC: 31.9 g/dL (ref 30.0–36.0)
MCV: 85.7 fL (ref 78.0–100.0)
MONO ABS: 1.1 10*3/uL — AB (ref 0.1–1.0)
MONOS PCT: 12 %
NEUTROS PCT: 60 %
Neutro Abs: 5.7 10*3/uL (ref 1.7–7.7)
PLATELETS: 115 10*3/uL — AB (ref 150–400)
RBC: 2.38 MIL/uL — AB (ref 4.22–5.81)
RDW: 22.9 % — ABNORMAL HIGH (ref 11.5–15.5)
WBC: 9.5 10*3/uL (ref 4.0–10.5)

## 2016-05-18 LAB — AMMONIA: AMMONIA: 57 umol/L — AB (ref 9–35)

## 2016-05-18 LAB — GLUCOSE, CAPILLARY: GLUCOSE-CAPILLARY: 82 mg/dL (ref 65–99)

## 2016-05-18 LAB — LIPID PANEL
CHOLESTEROL: 59 mg/dL (ref 0–200)
TRIGLYCERIDES: 48 mg/dL (ref <150)
VLDL: 10 mg/dL (ref 0–40)

## 2016-05-18 LAB — HEMOGLOBIN AND HEMATOCRIT, BLOOD
HCT: 21.7 % — ABNORMAL LOW (ref 39.0–52.0)
Hemoglobin: 6.8 g/dL — CL (ref 13.0–17.0)

## 2016-05-18 LAB — RENAL FUNCTION PANEL
Albumin: 1.5 g/dL — ABNORMAL LOW (ref 3.5–5.0)
Anion gap: 4 — ABNORMAL LOW (ref 5–15)
BUN: 16 mg/dL (ref 6–20)
CHLORIDE: 119 mmol/L — AB (ref 101–111)
CO2: 14 mmol/L — ABNORMAL LOW (ref 22–32)
CREATININE: 1.2 mg/dL (ref 0.61–1.24)
Calcium: 8 mg/dL — ABNORMAL LOW (ref 8.9–10.3)
Glucose, Bld: 77 mg/dL (ref 65–99)
POTASSIUM: 4.5 mmol/L (ref 3.5–5.1)
Phosphorus: 4.5 mg/dL (ref 2.5–4.6)
Sodium: 137 mmol/L (ref 135–145)

## 2016-05-18 LAB — APTT
aPTT: 39 seconds — ABNORMAL HIGH (ref 24–37)
aPTT: 49 seconds — ABNORMAL HIGH (ref 24–37)

## 2016-05-18 LAB — PREPARE RBC (CROSSMATCH)

## 2016-05-18 LAB — MAGNESIUM: Magnesium: 1.6 mg/dL — ABNORMAL LOW (ref 1.7–2.4)

## 2016-05-18 LAB — PROTIME-INR
INR: 2.22 — ABNORMAL HIGH (ref 0.00–1.49)
INR: 2.09 — ABNORMAL HIGH (ref 0.00–1.49)
PROTHROMBIN TIME: 23.3 s — AB (ref 11.6–15.2)
Prothrombin Time: 24.4 seconds — ABNORMAL HIGH (ref 11.6–15.2)

## 2016-05-18 LAB — CALCIUM, IONIZED: Calcium, Ionized, Serum: 5.1 mg/dL (ref 4.5–5.6)

## 2016-05-18 LAB — FIBRINOGEN
FIBRINOGEN: 199 mg/dL — AB (ref 204–475)
Fibrinogen: 175 mg/dL — ABNORMAL LOW (ref 204–475)

## 2016-05-18 LAB — PROCALCITONIN: Procalcitonin: 0.1 ng/mL

## 2016-05-18 MED ORDER — DEXTROSE 5 % IV SOLN
10.0000 mg | Freq: Once | INTRAVENOUS | Status: AC
  Administered 2016-05-18: 10 mg via INTRAVENOUS
  Filled 2016-05-18: qty 1

## 2016-05-18 MED ORDER — SODIUM CHLORIDE 0.9 % IV SOLN
Freq: Once | INTRAVENOUS | Status: AC
  Administered 2016-05-18: 06:00:00 via INTRAVENOUS

## 2016-05-18 MED ORDER — SODIUM CHLORIDE 0.9 % IV SOLN
Freq: Once | INTRAVENOUS | Status: AC
  Administered 2016-05-18: 07:00:00 via INTRAVENOUS

## 2016-05-18 MED ORDER — LEVOFLOXACIN IN D5W 750 MG/150ML IV SOLN
750.0000 mg | INTRAVENOUS | Status: DC
  Administered 2016-05-18 – 2016-05-21 (×4): 750 mg via INTRAVENOUS
  Filled 2016-05-18 (×7): qty 150

## 2016-05-18 MED ORDER — SODIUM CHLORIDE 0.9 % IV SOLN: Freq: Once | INTRAVENOUS | Status: DC

## 2016-05-18 MED ORDER — GADOBENATE DIMEGLUMINE 529 MG/ML IV SOLN
20.0000 mL | Freq: Once | INTRAVENOUS | Status: AC | PRN
  Administered 2016-05-18: 20 mL via INTRAVENOUS

## 2016-05-18 MED ORDER — MAGNESIUM SULFATE 2 GM/50ML IV SOLN
2.0000 g | Freq: Once | INTRAVENOUS | Status: AC
  Administered 2016-05-18: 2 g via INTRAVENOUS
  Filled 2016-05-18: qty 50

## 2016-05-18 MED ORDER — SODIUM CHLORIDE 0.9 % IV BOLUS (SEPSIS)
1000.0000 mL | Freq: Once | INTRAVENOUS | Status: AC
  Administered 2016-05-18: 1000 mL via INTRAVENOUS

## 2016-05-18 NOTE — Progress Notes (Signed)
Patient returned from MRI with RN, RT and transporter without any complications. He is placed on previous vent settings.

## 2016-05-18 NOTE — Progress Notes (Signed)
eLink Physician-Brief Progress Note Patient Name: Jon PatteeWayne Morgan DOB: [birth year] MRN: [id number]   Date of Service  05/18/2016  HPI/Events of Note    Lab Results  Component Value Date   HGB 6.8* 05/18/2016   HGB 6.5* 05/18/2016   HGB 7.6* 05/17/2016     INR still 2.0  eICU Interventions  Transfuse with one unit prbc's and 2 u ffp     Intervention Category Major Interventions: Hemorrhage - evaluation and management  Sandrea HughsMichael Dawood Spitler 05/18/2016, 7:26 PM

## 2016-05-18 NOTE — Progress Notes (Signed)
Pt placed on full support to transport to MRI. Transported with no events and brought back to room in 2105. Pt suctioned and EEG at bedside waiting.

## 2016-05-18 NOTE — Progress Notes (Signed)
Pharmacy Antibiotic Note  Elaina PatteeWayne Morgan is a 55 y.o. male admitted on 05/17/2016 with UTI.  Pharmacy has been consulted for Levaquin dosing.  Plan: Levaquin 750mg  IV q24h Monitor culture data, renal function and clinical course  Height: 6' (182.9 cm) Weight: 212 lb 15.4 oz (96.6 kg) IBW/kg (Calculated) : 77.6  Temp (24hrs), Avg:97.6 F (36.4 C), Min:93.8 F (34.3 C), Max:100.3 F (37.9 C)   Recent Labs Lab 05/16/16 2000 05/17/16 0147 05/17/16 0411 05/17/16 1005 05/17/16 1536 05/17/16 2205  WBC 8.3 8.0  --   --   --   --   CREATININE 0.87 0.71  --   --   --   --   LATICACIDVEN 3.1* 1.7 2.2* 1.5 1.3 1.7    Estimated Creatinine Clearance: 128.7 mL/min (by C-G formula based on Cr of 0.71).    No Known Allergies  Antimicrobials this admission: LVQ 5/27 >>   Dose adjustments this admission: n/a  Microbiology results: 5/25 BCx: ngtd 5/25 UCx: sent  5/26 Sputum: mod GPC in pairs, rare GN coccobacilli  5/26 MRSA PCR: neg   Arlean Hoppingorey M. Newman PiesBall, PharmD, BCPS Clinical Pharmacist Pager [phone] 05/18/2016 2:31 AM

## 2016-05-18 NOTE — Progress Notes (Signed)
Called Elink regarding low urine output and blood in urine. Awaiting call back, will continue to monitor. Suzy Bouchardhompson, Mersades Barbaro E, [place] 05/18/2016 .[phone]

## 2016-05-18 NOTE — Progress Notes (Signed)
PULMONARY / CRITICAL CARE MEDICINE   Name: Stockton Nunley MRN: [id number] DOB: 11-18-1961    ADMISSION DATE:  05/17/2016 CONSULTATION DATE:  05/17/16  REFERRING MD:  EDP at [facility]  CHIEF COMPLAINT:  Seizures  HISTORY OF PRESENT ILLNESS:  Pt is encephelopathic; therefore, this HPI is obtained from chart review. Anthonie Lotito is a 55 y.o. male with PMH of HTN. He experienced a seizure at home on 05/25 and was subsequently transported to [facility] ED. Per EMS, pt was initially awake but later became unresponsive en route and had periods of apnea. In ED, he remained unresponsive and was unable to protect airway; therefore, he was intubated by EDP. He was then transferred to [facility] ICU for further workup and management.  SUBJECTIVE:  Developed hematuria overnight with low UOP. Started on Levaquin empirically for possible UTI. Giving FFP & PRBC for coagulopathy & anemia. Sedation on hold but only response to painful stimulus.  REVIEW OF SYSTEMS:  Unable to obtain as patient is intubated.  VITAL SIGNS: BP 107/67 mmHg  Pulse 89  Temp(Src) 97.2 F (36.2 C) (Oral)  Resp 13  Ht 6' (1.829 m)  Wt 224 lb 6.9 oz (101.8 kg)  BMI 30.43 kg/m2  SpO2 100%  HEMODYNAMICS:    VENTILATOR SETTINGS: Vent Mode:  [-] PRVC FiO2 (%):  [40 %-50 %] 40 % Set Rate:  [14 bmp] 14 bmp Vt Set:  [550 mL] 550 mL PEEP:  [5 cmH20] 5 cmH20 Pressure Support:  [5 cmH20] 5 cmH20 Plateau Pressure:  [9 cmH20-15 cmH20] 10 cmH20  INTAKE / OUTPUT: I/O last 3 completed shifts: In: 2489.5 [I.V.:1919.5; Blood:335; Other:20; IV Piggyback:215] Out: 965 [Urine:965]   PHYSICAL EXAMINATION: General: Laying in bed. No family at bedside. No distress. Neuro: Sedated. Pupils symmetric. Withdrawal to pain in bilateral UE & RLE but not LLE. Reflexes asymmetric. Pupils equal. HEENT: No scleral injection or icterus. Endotracheal tube in place. Cardiovascular: Regular rate & rhythm. No appreciable JVD. Normal S1 & S2. Lungs: Coarse breath sounds  bilaterally. Symmetric chest wall rise on ventilator. Abdomen: Soft. Nondistended. Normal bowel sounds. Integument: Warm and dry. No rash on exposed skin. Patient does have decubitus ulcers noted.  LABS:  BMET  Recent Labs Lab 05/16/16 2000 05/17/16 0147 05/18/16 0249  NA 136 136 137  K 4.4 4.4 4.5  CL 114* 115* 119*  CO2 16* 16* 14*  BUN 13 11 16   CREATININE 0.87 0.71 1.20  GLUCOSE 103* 98 77    Electrolytes  Recent Labs Lab 05/16/16 2000 05/17/16 0147 05/18/16 0247 05/18/16 0249  CALCIUM 8.0* 8.1*  --  8.0*  MG  --  1.8 1.6*  --   PHOS  --  3.8  --  4.5    CBC  Recent Labs Lab 05/16/16 2000 05/17/16 0147 05/17/16 1005 05/18/16 0247  WBC 8.3 8.0  --  9.5  HGB 7.5* 6.5* 7.6* 6.5*  HCT 23.8* 20.5* 24.0* 20.4*  PLT 147* 138*  --  115*    Coag's  Recent Labs Lab 05/16/16 2000 05/18/16 0251  APTT  --  39*  INR 2.15 2.22*    Sepsis Markers  Recent Labs Lab 05/17/16 0147  05/17/16 1005 05/17/16 1536 05/17/16 2205 05/18/16 0247  LATICACIDVEN 1.7  < > 1.5 1.3 1.7  --   PROCALCITON <0.10  --   --   --   --  0.10  < > = values in this interval not displayed.  ABG  Recent Labs Lab 05/16/16 2018 05/17/16 0350  PHART 7.35 7.489*  PCO2ART 30* 20.3*  PO2ART 113* 254*    Liver Enzymes  Recent Labs Lab 05/16/16 2000 05/18/16 0249  AST 61*  --   ALT 33  --   ALKPHOS 61  --   BILITOT 4.4*  --   ALBUMIN 1.9* 1.5*    Cardiac Enzymes  Recent Labs Lab 05/17/16 0147  TROPONINI <0.03    Glucose  Recent Labs Lab 05/17/16 0133 05/17/16 2310  GLUCAP 104* 86    Imaging Mr Brain W Wo Contrast  05/18/2016  CLINICAL DATA:  Initial evaluation for acute seizure. EXAM: MRI HEAD WITHOUT AND WITH CONTRAST TECHNIQUE: Multiplanar, multiecho pulse sequences of the brain and surrounding structures were obtained without and with intravenous contrast. CONTRAST:  20mL MULTIHANCE GADOBENATE DIMEGLUMINE 529 MG/ML IV SOLN COMPARISON:  Prior CT from  05/16/2016. FINDINGS: Diffuse prominence of the CSF containing spaces is compatible with generalized cerebral atrophy. Patchy and confluent T2/FLAIR hyperintensity within the periventricular and deep white matter both cerebral hemispheres most like related chronic small vessel ischemic disease, mild in nature. T1 hyperintensity within the basal ganglia suggestive of underlying liver disease. Previously noted hypodense region within the parasagittal right frontal parietal region again seen. This measures 2.5 x 1.6 x 2.4 cm on today's exam (AP by a transverse by craniocaudad). This lesion demonstrates markedly increased restricted diffusion (Series 4, image 37). Hyperintense T2 signal intensity with FLAIR signal intensity seen centrally within this lesion as well.Associated rim of susceptibility artifact on gradient echo sequence (series 8, image 19). Patchy susceptibility artifact seen within the inferior aspect of the lesion as well (series 8, image 18). Mild associated edema about this lesion, most evident at its superior aspect (series 7, image 22). No significant rim enhancement about this lesion on post gadolinium sequences, although minimal patchy gyriform/parenchymal hands Min seen at its inferior aspect (series 13, image 9). Lesion is somewhat unusual, with differential considerations including a subacute hematoma, subacute infarction with hematoma formation, or intra cerebral abscess. Underlying mass is less favored given the relative lack of peripheral rim enhancement about this lesion. There is question of mildly increased diffusion abnormality at the posterior right hippocampus within the right temporal occipital region (series 4, image 22). Finding may reflect changes related to seizure or possibly subacute infarction, although no significant enhancement seen about this region. No other evidence for acute infarct. Gray-white matter differentiation otherwise well-maintained. Major intracranial vascular  flow voids are preserved. No other mass lesion. No mass effect or midline shift. No hydrocephalus. No extra-axial fluid collection. Major dural sinuses are grossly patent. Craniocervical junction normal. Visualized upper cervical spine unremarkable. Pituitary gland within normal limits. No acute abnormality about the globes and orbits. Mild mucosal thickening within the maxillary sinuses. Paranasal sinuses are otherwise clear. Fluid within the posterior nasopharynx. Patient is likely intubated. Trace opacity within the bilateral mastoid air cells. Inner ear structures normal. Bone marrow signal intensity within normal limits. No scalp soft tissue abnormality. IMPRESSION: 1. 2.5 x 1.6 x 2.4 cm lesion within the parasagittal right frontoparietal region as above. This lesion is somewhat unusual, with differential considerations including a possible subacute hematoma or subacute infarction with hematoma formation. Possible intra cerebral abscess could also be considered, although this is perhaps less favored given the relative lack of significant edema or enhancement about this lesion, although an abscess could have this appearance if the patient is immunocompromised. Similarly, underlying mass is less favored given the relative lack of edema and intrinsic enhancement. A short interval follow-up  study to evaluate for interval progression/regression may be helpful for further evaluation. 2. Question mildly increased diffusion abnormality within the posterior mesial right temporal lobe/ hippocampus, which may reflect changes related to seizure and/or subacute infarction. Correlation with EEG recommended. 3. Mildly increased T1 signal intensity within the bilateral basal ganglia, most often seen with underlying intrinsic liver disease. Correlation with laboratory values suggested. 4. Mild age-related cerebral atrophy with chronic small vessel ischemic disease. Electronically Signed   By: Rise Mu M.D.   On:  05/18/2016 01:41     STUDIES:  CXR 05/25: opacity in left base. CT head 05/25: possible subacute infarction with associated edema. Neoplastic lesion can not be excluded. No hemorrhage. Port CXR 5/26:  Mild left basilar opacity. ETT in good position. OGT in distal esophagus. MRI brain 05/27: 2.5x1.6x2.4 parasagittal right frontoparietal lesion possible subacute hematoma or subacute infarction w/ hematoma. Less likely cerebral abscess or mass.Increased diffusion abnormality right temporal lobe/hippocampus possibly due to seizure and/or subacute infarct. Atrophy w/ small vessel disease. Port CXR 5/27:  ETT in good position. OGT in distal esophagus.   MICROBIOLOGY: Blood Ctx 5/25 >> Urine Ctx 5/25 >> Trach Asp Ctx 5/26 >> MRSA PCR 5/26:  Negative   Urine Ctx 5/27 >>  ANTIBIOTICS: Levaquin 5/27 >>  SIGNIFICANT EVENTS: 05/26 > admitted with seizures, required intubation for airway protection.  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 ? Subacute infarction vs neoplasm.  P:  Neurology following Goal RASS:  0 to -1 Versed gtt Fentanyl gtt Daily WUA Continue Keppra. Additional AED's per neuro. MRI brain pending Continue outpatient thiamine IV daily FA IV daily   PULMONARY A: Acute Respiratory Failure - Unable to protect airway. Possible Aspiration   P:  Full vent support. Wean as able. VAP prevention measures. SBT & WUA daily  CARDIOVASCULAR A:  H/O HTN  P:  Monitor hemodynamics. Hold outpatient amlodipine. Vitals per unit protocol  RENAL A:  Hypomagnesemia - Replacing IV. Lactic Acidosis - Resolved. Pseudohypocalcemia - corrects to 9.68. Ionized Ca 5.1 (normal). Hematuria - Possible UTI.  P:  NS @ 100. Monitoring electrolytes & renal function daily Trending UOP Magnesium Sulfate 2gm IV  GASTROINTESTINAL A:   Nutrition. H/O Liver Cirrhosis  P:  NPO Protonix IV qhs Trending LFTs intermittently Advancing OGT  HEMATOLOGIC A:  Anemia - No signs of active bleeding. S/P 1u PRBC 5/26. S/P 1u PRBC 5/27. Coagulopathy - Likely due to cirrhosis. 2u FFP 5/27. Thrombocytopenia - Mild. VTE Prophylaxis.  P:  Trending cell counts daily w/ CBC Trending Hgb/Hct q12hr Trending Coags q12hr SCDs 2u FFP today Vitamin K  IV Checking Fibrinogen stat  INFECTIOUS A:  Possible Aspiration  Possible UTI  P:  Levaquin Day #1 Follow cultures. Trending PCT per algorithm   ENDOCRINE A:  No acute issues.  P:  Monitor glucose on BMP.  Family updated: Son updated by Dr. Jamison Neighbor via phone on 5/27.  Interdisciplinary Family Meeting v Palliative Care Meeting:  Due by: 06/01.  TODAY'S SUMMARY:  55 y.o. M with hx of HTN, admitted 05/26 after grand mal seizure. Required intubation in ED due to inability to protect airway. Continuing to hold sedation in an effort to better assess neurological function. Appreciate assistance from neurology. Continuing to monitor coagulopathy & transfuse accordingly. Continuing empiric Levaquin for now while awaiting urine culture.  I have spent an additional 37  minutes of critical care time today reviewing the patient's electronic medical record, discussing the plan of care at bedside with the patient's son, and caring for the patient.  Donna ChristenJennings E. Jamison NeighborNestor, M.D. [facility] Pager:  [phone] After 3pm or if no response, call [phone] 6:55 AM 05/18/2016

## 2016-05-18 NOTE — Progress Notes (Addendum)
eLink Physician-Brief Progress Note Patient Name: Elaina PatteeWayne Bowler DOB: 25-Sep-1961 MRN: [id number]   Date of Service  05/18/2016  HPI/Events of Note  Hgb = 6.5 and INR = 2.22.  eICU Interventions  Will order: 1. Transfuse 1 unit PRBC. 2. Transfuse 2 units FFP.     Intervention Category Intermediate Interventions: Coagulopathy - evaluation and management;Other:  Sommer,Steven Dennard Nipugene 05/18/2016, 3:27 AM

## 2016-05-18 NOTE — Progress Notes (Signed)
CRITICAL VALUE ALERT  Critical value received:  Hgb 6.5  Date of notification:  05/18/2016  Time of notification:  0319  Critical value read back:Yes.    Nurse who received alert:  Normand Damron Thompson  MD notified (1st page):  Hosie PoissonSumner  Time of first page:  0319  MD notified (2nd page):  Time of second page:  Responding MD:  Hosie PoissonSumner  Time MD responded:  [phone]

## 2016-05-18 NOTE — Progress Notes (Signed)
Reported hgb of 6.8 to Dr. Sherene SiresWert. Awaiting orders

## 2016-05-18 NOTE — Progress Notes (Addendum)
eLink Physician-Brief Progress Note Patient Name: Elaina PatteeWayne Lord DOB: [birth year] MRN: [id number]   Date of Service  05/18/2016  HPI/Events of Note  Multiple issues: 1. Oliguria and 2. Hematuria - INR = 2.15 and UA - WBC - TNTC, Rare Bacteria, Leuk 3+ and nitrite negative all from 5/25. Already has SCD's ordered.   eICU Interventions  Will order: 1. Bolus with 0.9 NaCl 1 liter IV over 1 hour now.  2. Urine Culture now. 3. Levaquin per pharmacy consult. 4. PT-INR and PTT now.  5. D/C Heparin [place].     Intervention Category Intermediate Interventions: Oliguria - evaluation and management;Infection - evaluation and management  Jaecob Lowden Eugene 05/18/2016, 2:24 AM

## 2016-05-18 NOTE — Progress Notes (Signed)
LTM EEG initiated. 

## 2016-05-18 NOTE — Progress Notes (Signed)
STROKE TEAM PROGRESS NOTE   HISTORY OF PRESENT ILLNESS (per record) Jon Morgan is an 55 y.o. male history of hypertension was brought to the emergency room at [facility] following a witnessed generalized seizure at home. Patient has no history of seizure disorder. In route to the hospital he was noted to have experienced an episode of apnea. Patient had recurrent apnea in the ED and was subsequently intubated and placed on mechanical ventilation. He was also given Keppra 500 mg IV. Sedation was attempted with propofol but was unsuccessful. He was subsequently started on fentanyl and Versed IV. No further seizure activity has been reported. CT scan of his head showed a low density medial posterior right frontal lesion. Stroke was felt to be likely. However mass lesion cannot be ruled out. Patient was transferred to [facility] for further management. He had a low-grade temp of 100.3. WBC count was normal. Hemoglobin was 7.5. Prothrombin time was elevated with an INR of 2.15. No anticoagulation agent was listed with his medications. AST was 61 and total bilirubin was 4.4. MRI of his brain is pending. Serum ammonia level is also pending.   SUBJECTIVE (INTERVAL HISTORY) His family was not at the bedside.  However, I had a lengthy conversation with the son by phone.  Son provided history of significant past ETOH and drug use with resulting co-morbidities including liver cirrhosis and challenges with waking from sedation medications. Overall he is unable to discuss his condition   OBJECTIVE Temp:  [97.2 F (36.2 C)-100.3 F (37.9 C)] 98.8 F (37.1 C) (05/27 0750) Pulse Rate:  [85-115] 86 (05/27 0800) Cardiac Rhythm:  [-] Normal sinus rhythm (05/27 0730) Resp:  [7-26] 8 (05/27 0800) BP: (98-152)/(60-83) 107/63 mmHg (05/27 0800) SpO2:  [90 %-100 %] 100 % (05/27 0800) FiO2 (%):  [40 %] 40 % (05/27 0800) Weight:  [101.8 kg (224 lb 6.9 oz)] 101.8 kg (224 lb 6.9 oz) (05/27 0450)  CBC:  Recent Labs Lab 05/16/16 2000  05/17/16 0147 05/17/16 1005 05/18/16 0247  WBC 8.3 8.0  --  9.5  NEUTROABS 5.7  --   --  5.7  HGB 7.5* 6.5* 7.6* 6.5*  HCT 23.8* 20.5* 24.0* 20.4*  MCV 86.8 82.0  --  85.7  PLT 147* 138*  --  115*    Basic Metabolic Panel:  Recent Labs Lab 05/17/16 0147 05/18/16 0247 05/18/16 0249  NA 136  --  137  K 4.4  --  4.5  CL 115*  --  119*  CO2 16*  --  14*  GLUCOSE 98  --  77  BUN 11  --  16  CREATININE 0.71  --  1.20  CALCIUM 8.1*  --  8.0*  MG 1.8 1.6*  --   PHOS 3.8  --  4.5    Lipid Panel:    Component Value Date/Time   CHOL 59 05/18/2016 0249   TRIG 48 05/18/2016 0249   HDL <10* 05/18/2016 0249   CHOLHDL NOT CALCULATED 05/18/2016 0249   VLDL 10 05/18/2016 0249   LDLCALC NOT CALCULATED 05/18/2016 0249   HgbA1c: No results found for: HGBA1C Urine Drug Screen:    Component Value Date/Time   LABOPIA NONE DETECTED 05/17/2016 0232   COCAINSCRNUR NONE DETECTED 05/17/2016 0232   LABBENZ POSITIVE* 05/17/2016 0232   AMPHETMU NONE DETECTED 05/17/2016 0232   THCU NONE DETECTED 05/17/2016 0232   LABBARB NONE DETECTED 05/17/2016 0232      IMAGING  Dg Chest 1 View 05/16/2016   Hazy attenuation left  lung base could reflect pleural effusion and/or consolidation. Possibilities include aspiration pneumonitis.   Ct Head Wo Contrast 05/16/2016   1. Ill-defined low-density focus in the upper right frontal lobe, near the vertex, measuring approximately 3 cm greatest dimension. Associated local mass effect on adjacent sulci but no midline shift or herniation. Favor subacute infarction with associated edema. Neoplastic lesion cannot be confidently excluded. Consider MRI for further characterization.  2. No intracranial hemorrhage.     Mr Laqueta JeanBrain W Wo Contrast 05/18/2016   1. 2.5 x 1.6 x 2.4 cm lesion within the parasagittal right frontoparietal region as above. This lesion is somewhat unusual, with differential considerations including a possible subacute hematoma or subacute  infarction with hematoma formation. Possible intra cerebral abscess could also be considered, although this is perhaps less favored given the relative lack of significant edema or enhancement about this lesion, although an abscess could have this appearance if the patient is immunocompromised. Similarly, underlying mass is less favored given the relative lack of edema and intrinsic enhancement. A short interval follow-up study to evaluate for interval progression/regression may be helpful for further evaluation.  2. Question mildly increased diffusion abnormality within the posterior mesial right temporal lobe/ hippocampus, which may reflect changes related to seizure and/or subacute infarction. Correlation with EEG recommended.  3. Mildly increased T1 signal intensity within the bilateral basal ganglia, most often seen with underlying intrinsic liver disease. Correlation with laboratory values suggested.  4. Mild age-related cerebral atrophy with chronic small vessel ischemic disease.     Dg Chest Port 1 View 05/18/2016   Endotracheal tube in grossly good position. Distal tip of nasogastric tube is unchanged in position, being in the expected position of the distal esophagus. Advancement is recommended.     Dg Chest Port 1 View 05/17/2016   1. Enteric tube in the distal esophagus. Recommend advancement of at least 12 cm for optimal placement.  2. Improving left lung base aeration with residual atelectasis.     MRA of Head and Neck - pending    MRV of Head - pending   PHYSICAL EXAM  HEENT- Normocephalic, no lesions, without obvious abnormality. Normal external eye and conjunctiva. Normal TM's bilaterally. Normal auditory canals and external ears. Normal external nose, mucus membranes and septum. Normal pharynx. Neck supple with no masses, nodes, nodules or enlargement. Cardiovascular - regular rate and rhythm, S1, S2 normal, no murmur, click, rub or gallop Lungs - chest clear, no  wheezing, rales, normal symmetric air entry Abdomen - soft, non-tender; bowel sounds normal; no masses, no organomegaly Extremities - no joint deformities, effusion, or inflammation and no edema  Neurologic Examination: Patient was intubated and on mechanical ventilation. Spontaneous respirations were noted. He was no longer on sedation with fentanyl and Versed IV. There was no response to noxious stimuli. Pupils were very small and equal and reacted to light was indeterminate. Extraocular movements are absent with oculocephalic maneuvers. Face was symmetrical with no focal weakness Muscle tone was flaccid throughout. There were no spontaneous movements and no abnormal posturing. Plantar responses were mute.  ASSESSMENT/PLAN Mr. Jon Morgan is a 55 y.o. male with history of hypertension presenting with a witnessed generalized seizure and respiratory distress requiring intubation and ventilation. He did not receive IV t-PA due to an elevated INR.  Stroke:  Non-dominant lesion of uncertain etiology - rule out cerebral venous thrombosis.  Resultant  Loss of consciousness  MRI - 2.5 x 1.6 x 2.4 cm lesion within the parasagittal right frontoparietal region  MRA  head and neck - pending  MRV - pending  Carotid Doppler - refer to MRA of the neck  EEG - Abnormalities: - generalized irregular slow activity  Urine culture - pending  2D Echo - will order  LDL - Not calculated  Ammonia - 57 (H)  HIV - pending   fibrinogen - 199 (L)  HgbA1c pending  VTE prophylaxis - SCDs  Diet NPO time specified  No antithrombotic prior to admission, now on No antithrombotic secondary to hemorrhage  Ongoing aggressive stroke risk factor management  Therapy recommendations: Pending  Disposition:  Pending  Hypertension  Stable  SBP goal < 180 for 24 hours - then < 169 mmHG  Hyperlipidemia  Home meds: No lipid lowering medications prior to admission  LDL not be calculated, goal <  70  Other Stroke Risk Factors  Obesity, Body mass index is 30.43 kg/(m^2).   Other Active Problems  Generalized seizures - EEG as above - now on IV Keppra 1000 mg every 12 hours - continuous EEG - pending  Fever - 99.3 now - Day # 1 IV Levaquin  Anemia  Elevated INR - daily - 2.22 ->  Intubated secondary to apnea  Mildly elevated liver function tests  Ammonia, HIV, and fibrinogen - see above  Hx of alcoholic cirrhosis  Anemia  Thrombocytopenia  Tachycardia  NPO  PLAN  Stat MRA of the head and neck and MRV of head - rule out cerebral venous thrombosis  Hospital day # 1  Delton See PA-C [facility] Pager [phone] 05/18/2016, 1:40 PM  CRITICAL CARE NEUROLOGY ATTENDING NOTE Patient was seen and examined by me personally. I reviewed notes, independently viewed imaging studies, participated in medical decision making and plan of care. I have made additions or clarifications directly to the above note.  Documentation accurately reflects findings.  The laboratory and radiographic studies were personally reviewed by me.  ROS: pertinent positives could not be fully documented due to LOC  Assessment and plan completed by me personally and fully documented above.  Rule out cerebral venous thrombosis  MRA, MRV  Send ammonia level  cEEG to rule non-clinical status  Condition is unchanged    This patient is critically ill and at significant risk of neurological worsening, death and care requires constant monitoring of vital signs, hemodynamics,respiratory and cardiac monitoring, extensive review of multiple databases, frequent neurological assessment, discussion with family, other specialists and medical decision making of high complexity.  This critical care time does not reflect procedure time, or teaching time or supervisory time of PA/NP/Med Resident etc. but could involve care discussion time.  I spent 30 minutes of Neurocritical Care time in  the care of  this patient.  SIGNED BY: Dr. Sula Soda     To contact Stroke Continuity provider, please refer to [email or web address]. After hours, contact General Neurology

## 2016-05-18 NOTE — Progress Notes (Signed)
Dr. Earl LitesGregory with neuro update the son via telephone for roughly 20 mins. Explained the diagnostic test that would be conducted soon.

## 2016-05-19 ENCOUNTER — Encounter (HOSPITAL_COMMUNITY): Payer: Self-pay | Admitting: *Deleted

## 2016-05-19 DIAGNOSIS — I619 Nontraumatic intracerebral hemorrhage, unspecified: Secondary | ICD-10-CM | POA: Insufficient documentation

## 2016-05-19 LAB — HEMOGLOBIN AND HEMATOCRIT, BLOOD
HEMATOCRIT: 23.4 % — AB (ref 39.0–52.0)
HEMOGLOBIN: 7.3 g/dL — AB (ref 13.0–17.0)

## 2016-05-19 LAB — PROCALCITONIN

## 2016-05-19 LAB — PREPARE FRESH FROZEN PLASMA
UNIT DIVISION: 0
Unit division: 0

## 2016-05-19 LAB — RENAL FUNCTION PANEL
Albumin: 1.7 g/dL — ABNORMAL LOW (ref 3.5–5.0)
Anion gap: 6 (ref 5–15)
BUN: 17 mg/dL (ref 6–20)
CHLORIDE: 118 mmol/L — AB (ref 101–111)
CO2: 16 mmol/L — ABNORMAL LOW (ref 22–32)
CREATININE: 0.99 mg/dL (ref 0.61–1.24)
Calcium: 8.6 mg/dL — ABNORMAL LOW (ref 8.9–10.3)
Glucose, Bld: 89 mg/dL (ref 65–99)
PHOSPHORUS: 4.7 mg/dL — AB (ref 2.5–4.6)
Potassium: 4.1 mmol/L (ref 3.5–5.1)
Sodium: 140 mmol/L (ref 135–145)

## 2016-05-19 LAB — CBC WITH DIFFERENTIAL/PLATELET
BASOS ABS: 0 10*3/uL (ref 0.0–0.1)
BASOS PCT: 0 %
EOS ABS: 0.4 10*3/uL (ref 0.0–0.7)
Eosinophils Relative: 4 %
HCT: 21.4 % — ABNORMAL LOW (ref 39.0–52.0)
HEMOGLOBIN: 6.9 g/dL — AB (ref 13.0–17.0)
LYMPHS ABS: 1.9 10*3/uL (ref 0.7–4.0)
Lymphocytes Relative: 22 %
MCH: 27.4 pg (ref 26.0–34.0)
MCHC: 32.2 g/dL (ref 30.0–36.0)
MCV: 84.9 fL (ref 78.0–100.0)
Monocytes Absolute: 1.1 10*3/uL — ABNORMAL HIGH (ref 0.1–1.0)
Monocytes Relative: 13 %
NEUTROS PCT: 61 %
Neutro Abs: 5.1 10*3/uL (ref 1.7–7.7)
Platelets: 104 10*3/uL — ABNORMAL LOW (ref 150–400)
RBC: 2.52 MIL/uL — AB (ref 4.22–5.81)
RDW: 20.9 % — ABNORMAL HIGH (ref 11.5–15.5)
WBC: 8.5 10*3/uL (ref 4.0–10.5)

## 2016-05-19 LAB — URINE CULTURE: Culture: >=100000 — AB

## 2016-05-19 LAB — PREPARE RBC (CROSSMATCH)

## 2016-05-19 LAB — HEPATIC FUNCTION PANEL
ALT: 24 U/L (ref 17–63)
AST: 37 U/L (ref 15–41)
Albumin: 1.7 g/dL — ABNORMAL LOW (ref 3.5–5.0)
Alkaline Phosphatase: 41 U/L (ref 38–126)
BILIRUBIN DIRECT: 2.9 mg/dL — AB (ref 0.1–0.5)
BILIRUBIN INDIRECT: 3.2 mg/dL — AB (ref 0.3–0.9)
BILIRUBIN TOTAL: 6.1 mg/dL — AB (ref 0.3–1.2)
Total Protein: 6.1 g/dL — ABNORMAL LOW (ref 6.5–8.1)

## 2016-05-19 LAB — PROTIME-INR
INR: 2 — AB (ref 0.00–1.49)
INR: 1.65 — ABNORMAL HIGH (ref 0.00–1.49)
PROTHROMBIN TIME: 22.5 s — AB (ref 11.6–15.2)
Prothrombin Time: 19.5 seconds — ABNORMAL HIGH (ref 11.6–15.2)

## 2016-05-19 LAB — HIV ANTIBODY (ROUTINE TESTING W REFLEX): HIV SCREEN 4TH GENERATION: NONREACTIVE

## 2016-05-19 LAB — FIBRINOGEN
FIBRINOGEN: 209 mg/dL (ref 204–475)
Fibrinogen: 235 mg/dL (ref 204–475)

## 2016-05-19 LAB — CULTURE, RESPIRATORY W GRAM STAIN: Culture: NORMAL

## 2016-05-19 LAB — CULTURE, RESPIRATORY

## 2016-05-19 LAB — AMMONIA: AMMONIA: 46 umol/L — AB (ref 9–35)

## 2016-05-19 LAB — APTT
APTT: 44 s — AB (ref 24–37)
aPTT: 37 seconds (ref 24–37)

## 2016-05-19 LAB — MAGNESIUM: MAGNESIUM: 2 mg/dL (ref 1.7–2.4)

## 2016-05-19 MED ORDER — SODIUM CHLORIDE 0.9 % IV SOLN: Freq: Once | INTRAVENOUS | Status: DC

## 2016-05-19 MED ORDER — FENTANYL CITRATE (PF) 100 MCG/2ML IJ SOLN: 25.0000 ug | INTRAMUSCULAR | Status: DC | PRN

## 2016-05-19 NOTE — Progress Notes (Signed)
LTM EEG continues.  Impedances checked; patient skin condition checked and no skin breakdown noted.

## 2016-05-19 NOTE — Progress Notes (Signed)
eLink Physician-Brief Progress Note Patient Name: Jon PatteeWayne Morgan DOB: 10/24/1961 MRN: [id number]   Date of Service  05/19/2016  HPI/Events of Note  Multiple issues: 1. Anemia - Hgb = 6.9 and 2. Coagulopathy - INR = 2.0.  eICU Interventions  Will order: 1. Transfuse 1 unit PRBC now. 2. Transfuse 2 units FFP now.     Intervention Category Intermediate Interventions: Coagulopathy - evaluation and management;Other:  Lenell AntuSommer,Mose Colaizzi Eugene 05/19/2016, 5:33 AM

## 2016-05-19 NOTE — Progress Notes (Signed)
[facility] to transfuse blood products. [facility] to be discontinued due to entry error.

## 2016-05-19 NOTE — Progress Notes (Signed)
PULMONARY / CRITICAL CARE MEDICINE   Name: Jon Morgan MRN: [id number] DOB: Jun 25, 1961    ADMISSION DATE:  05/17/2016 CONSULTATION DATE:  05/17/16  REFERRING MD:  EDP at [facility]  CHIEF COMPLAINT:  Seizures  HISTORY OF PRESENT ILLNESS:  Pt is encephelopathic; therefore, this HPI is obtained from chart review. Jon Morgan is a 55 y.o. male with PMH of HTN. He experienced a seizure at home on 05/25 and was subsequently transported to [facility] ED. Per EMS, pt was initially awake but later became unresponsive en route and had periods of apnea. In ED, he remained unresponsive and was unable to protect airway; therefore, he was intubated by EDP. He was then transferred to [facility] ICU for further workup and management.  SUBJECTIVE:  Persistent anemia overnight & transfused 1u PRBC & 2u FFP. No acute events overnight. Patient following commands today.  REVIEW OF SYSTEMS:  Unable to obtain as patient is intubated.  VITAL SIGNS: BP 121/63 mmHg  Pulse 87  Temp(Src) 98.8 F (37.1 C) (Oral)  Resp 11  Ht 6' (1.829 m)  Wt 224 lb 6.9 oz (101.8 kg)  BMI 30.43 kg/m2  SpO2 100%  HEMODYNAMICS:    VENTILATOR SETTINGS: Vent Mode:  [-] PRVC FiO2 (%):  [40 %] 40 % Set Rate:  [14 bmp] 14 bmp Vt Set:  [550 mL] 550 mL PEEP:  [5 cmH20] 5 cmH20 Pressure Support:  [5 cmH20] 5 cmH20 Plateau Pressure:  [2 cmH20-14 cmH20] 10 cmH20  INTAKE / OUTPUT: I/O last 3 completed shifts: In: 5531.6 [I.V.:4284.6; Blood:597; NG/GT:70; IV Piggyback:580] Out: 1583 [Urine:1583]   PHYSICAL EXAMINATION: General: Laying in bed. No family at bedside. No distress. Neuro: Sedate. Pupils symmetric. Moves right foot & arm on command. HEENT: No scleral injection. Endotracheal tube in place. Cardiovascular: Regular rate & rhythm. No appreciable JVD. Normal S1 & S2. Lungs: Coarse breath sounds bilaterally. Symmetric chest wall rise on ventilator. Abdomen: Soft. Nondistended. Normal bowel sounds. Integument: Warm and dry. No rash on  exposed skin. Patient does have decubitus ulcers noted.  LABS:  BMET  Recent Labs Lab 05/17/16 0147 05/18/16 0249 05/19/16 0220  NA 136 137 140  K 4.4 4.5 4.1  CL 115* 119* 118*  CO2 16* 14* 16*  BUN CREATININE 0.71 1.20 0.99  GLUCOSE 98 77 89    Electrolytes  Recent Labs Lab 05/17/16 0147 05/18/16 0247 05/18/16 0249 05/19/16 0220  CALCIUM 8.1*  --  8.0* 8.6*  MG 1.8 1.6*  --  2.0  PHOS 3.8  --  4.5 4.7*    CBC  Recent Labs Lab 05/17/16 0147  05/18/16 0247 05/18/16 1802 05/19/16 0220  WBC 8.0  --  9.5  --  8.5  HGB 6.5*  < > 6.5* 6.8* 6.9*  HCT 20.5*  < > 20.4* 21.7* 21.4*  PLT 138*  --  115*  --  104*  < > = values in this interval not displayed.  Coag's  Recent Labs Lab 05/18/16 0251 05/18/16 1802 05/19/16 0220  APTT 39* 49* 44*  INR 2.22* 2.09* 2.00*    Sepsis Markers  Recent Labs Lab 05/17/16 0147  05/17/16 1005 05/17/16 1536 05/17/16 2205 05/18/16 0247 05/19/16 0220  LATICACIDVEN 1.7  < > 1.5 1.3 1.7  --   --   PROCALCITON <0.10  --   --   --   --  0.10 <0.10  < > = values in this interval not displayed.  ABG  Recent Labs Lab 05/16/16 2018 05/17/16  0350  PHART 7.35 7.489*  PCO2ART 30* 20.3*  PO2ART 113* 254*    Liver Enzymes  Recent Labs Lab 05/16/16 2000 05/18/16 0249 05/19/16 0220  AST 61*  --  37  ALT 33  --  24  ALKPHOS 61  --  41  BILITOT 4.4*  --  6.1*  ALBUMIN 1.9* 1.5* 1.7*  1.7*    Cardiac Enzymes  Recent Labs Lab 05/17/16 0147  TROPONINI <0.03    Glucose  Recent Labs Lab 05/17/16 0133 05/17/16 2310 05/18/16 1152  GLUCAP 104* 86 82    Imaging Mr Maxine Glenn Head Wo Contrast  05/18/2016  CLINICAL DATA:  55 year old male with seizure 05/16/2016. Subsequent encounter. EXAM: MRA NECK WITHOUT AND WITH CONTRAST MRA HEAD WITHOUT CONTRAST MRV HEAD WITHOUT CONTRAST TECHNIQUE: Multiplanar and multiecho pulse sequences of the neck were obtained without and with intravenous contrast. Angiographic  images of the neck were obtained using MRA technique without and with intravenous contast.; Angiographic images of the Circle of Willis were obtained using MRA technique without intravenous contrast. CONTRAST:  20mL MULTIHANCE GADOBENATE DIMEGLUMINE 529 MG/ML IV SOLN COMPARISON:  05/17/2016 MR brain.  05/16/2016 head CT. FINDINGS: MRA NECK Exam is motion degraded. Limited evaluation of aortic arch and origin of great vessels. Evaluation carotid bifurcation limited without high-grade stenosis detected. Flow seen within both vertebral arteries. MRA HEAD Exam is motion degraded. Anterior circulation without medium or large size vessel significant stenosis or occlusion. Fetal contribution to posterior cerebral artery. Prominent infundibulum on the left. Mild narrowing proximal cavernous segment right internal carotid artery. Middle cerebral artery branch vessel irregularity and narrowing bilaterally more notable on the left. Ectatic vertebral arteries and basilar artery without high-grade stenosis. Left vertebral artery dominant in size. Poor delineation left posterior inferior cerebellar artery. Moderate tandem stenosis posterior cerebral arteries more notable on the left. Please note that MR angiogram does not extend to the abnormality noted within the right parasagittal frontal -parietal region. MRV HEAD Exam is motion degraded. This limits evaluation however when taking current examination combined with the MR brain, major dural sinuses appear to be grossly patent with major drainage to the right. IMPRESSION: MRA NECK Exam is motion degraded. Limited evaluation of aortic arch and origin of great vessels. Evaluation carotid bifurcation limited without high-grade stenosis detected. Flow seen within both vertebral arteries. MRA HEAD Exam is motion degraded. Anterior circulation without medium or large size vessel significant stenosis or occlusion. Fetal contribution to posterior cerebral artery. Prominent infundibulum  on the left. Mild narrowing proximal cavernous segment right internal carotid artery. Middle cerebral artery branch vessel irregularity and narrowing bilaterally more notable on the left. Ectatic vertebral arteries and basilar artery without high-grade stenosis. Left vertebral artery dominant in size. Poor delineation left posterior inferior cerebellar artery. Moderate tandem stenosis posterior cerebral arteries more notable on the left. Please note that MR angiogram does not extend to the abnormality noted within the right parasagittal frontal -parietal region. MRV HEAD Exam is motion degraded. This limits evaluation however when taking current examination combined with the MR brain, major dural sinuses appear to be grossly patent with major drainage to the right. Electronically Signed   By: Lacy Duverney M.D.   On: 05/18/2016 17:28   Mr Angiogram Neck W Wo Contrast  05/18/2016  CLINICAL DATA:  55 year old male with seizure 05/16/2016. Subsequent encounter. EXAM: MRA NECK WITHOUT AND WITH CONTRAST MRA HEAD WITHOUT CONTRAST MRV HEAD WITHOUT CONTRAST TECHNIQUE: Multiplanar and multiecho pulse sequences of the neck were obtained without and with  intravenous contrast. Angiographic images of the neck were obtained using MRA technique without and with intravenous contast.; Angiographic images of the Circle of Willis were obtained using MRA technique without intravenous contrast. CONTRAST:  20mL MULTIHANCE GADOBENATE DIMEGLUMINE 529 MG/ML IV SOLN COMPARISON:  05/17/2016 MR brain.  05/16/2016 head CT. FINDINGS: MRA NECK Exam is motion degraded. Limited evaluation of aortic arch and origin of great vessels. Evaluation carotid bifurcation limited without high-grade stenosis detected. Flow seen within both vertebral arteries. MRA HEAD Exam is motion degraded. Anterior circulation without medium or large size vessel significant stenosis or occlusion. Fetal contribution to posterior cerebral artery. Prominent infundibulum on  the left. Mild narrowing proximal cavernous segment right internal carotid artery. Middle cerebral artery branch vessel irregularity and narrowing bilaterally more notable on the left. Ectatic vertebral arteries and basilar artery without high-grade stenosis. Left vertebral artery dominant in size. Poor delineation left posterior inferior cerebellar artery. Moderate tandem stenosis posterior cerebral arteries more notable on the left. Please note that MR angiogram does not extend to the abnormality noted within the right parasagittal frontal -parietal region. MRV HEAD Exam is motion degraded. This limits evaluation however when taking current examination combined with the MR brain, major dural sinuses appear to be grossly patent with major drainage to the right. IMPRESSION: MRA NECK Exam is motion degraded. Limited evaluation of aortic arch and origin of great vessels. Evaluation carotid bifurcation limited without high-grade stenosis detected. Flow seen within both vertebral arteries. MRA HEAD Exam is motion degraded. Anterior circulation without medium or large size vessel significant stenosis or occlusion. Fetal contribution to posterior cerebral artery. Prominent infundibulum on the left. Mild narrowing proximal cavernous segment right internal carotid artery. Middle cerebral artery branch vessel irregularity and narrowing bilaterally more notable on the left. Ectatic vertebral arteries and basilar artery without high-grade stenosis. Left vertebral artery dominant in size. Poor delineation left posterior inferior cerebellar artery. Moderate tandem stenosis posterior cerebral arteries more notable on the left. Please note that MR angiogram does not extend to the abnormality noted within the right parasagittal frontal -parietal region. MRV HEAD Exam is motion degraded. This limits evaluation however when taking current examination combined with the MR brain, major dural sinuses appear to be grossly patent with  major drainage to the right. Electronically Signed   By: Lacy DuverneySteven  Olson M.D.   On: 05/18/2016 17:28   Mr Mrv Head Wo Cm  05/18/2016  CLINICAL DATA:  55 year old male with seizure 05/16/2016. Subsequent encounter. EXAM: MRA NECK WITHOUT AND WITH CONTRAST MRA HEAD WITHOUT CONTRAST MRV HEAD WITHOUT CONTRAST TECHNIQUE: Multiplanar and multiecho pulse sequences of the neck were obtained without and with intravenous contrast. Angiographic images of the neck were obtained using MRA technique without and with intravenous contast.; Angiographic images of the Circle of Willis were obtained using MRA technique without intravenous contrast. CONTRAST:  20mL MULTIHANCE GADOBENATE DIMEGLUMINE 529 MG/ML IV SOLN COMPARISON:  05/17/2016 MR brain.  05/16/2016 head CT. FINDINGS: MRA NECK Exam is motion degraded. Limited evaluation of aortic arch and origin of great vessels. Evaluation carotid bifurcation limited without high-grade stenosis detected. Flow seen within both vertebral arteries. MRA HEAD Exam is motion degraded. Anterior circulation without medium or large size vessel significant stenosis or occlusion. Fetal contribution to posterior cerebral artery. Prominent infundibulum on the left. Mild narrowing proximal cavernous segment right internal carotid artery. Middle cerebral artery branch vessel irregularity and narrowing bilaterally more notable on the left. Ectatic vertebral arteries and basilar artery without high-grade stenosis. Left vertebral artery dominant in  size. Poor delineation left posterior inferior cerebellar artery. Moderate tandem stenosis posterior cerebral arteries more notable on the left. Please note that MR angiogram does not extend to the abnormality noted within the right parasagittal frontal -parietal region. MRV HEAD Exam is motion degraded. This limits evaluation however when taking current examination combined with the MR brain, major dural sinuses appear to be grossly patent with major drainage  to the right. IMPRESSION: MRA NECK Exam is motion degraded. Limited evaluation of aortic arch and origin of great vessels. Evaluation carotid bifurcation limited without high-grade stenosis detected. Flow seen within both vertebral arteries. MRA HEAD Exam is motion degraded. Anterior circulation without medium or large size vessel significant stenosis or occlusion. Fetal contribution to posterior cerebral artery. Prominent infundibulum on the left. Mild narrowing proximal cavernous segment right internal carotid artery. Middle cerebral artery branch vessel irregularity and narrowing bilaterally more notable on the left. Ectatic vertebral arteries and basilar artery without high-grade stenosis. Left vertebral artery dominant in size. Poor delineation left posterior inferior cerebellar artery. Moderate tandem stenosis posterior cerebral arteries more notable on the left. Please note that MR angiogram does not extend to the abnormality noted within the right parasagittal frontal -parietal region. MRV HEAD Exam is motion degraded. This limits evaluation however when taking current examination combined with the MR brain, major dural sinuses appear to be grossly patent with major drainage to the right. Electronically Signed   By: Lacy Duverney M.D.   On: 05/18/2016 17:28     STUDIES:  CXR 05/25: opacity in left base. CT head 05/25: possible subacute infarction with associated edema. Neoplastic lesion can not be excluded. No hemorrhage. Port CXR 5/26:  Mild left basilar opacity. ETT in good position. OGT in distal esophagus. MRI brain 05/27: 2.5x1.6x2.4 parasagittal right frontoparietal lesion possible subacute hematoma or subacute infarction w/ hematoma. Less likely cerebral abscess or mass.Increased diffusion abnormality right temporal lobe/hippocampus possibly due to seizure and/or subacute infarct. Atrophy w/ small vessel disease. Port CXR 5/27:  ETT in good position. OGT in distal esophagus.  MRA/MRV  5/27:  No high grade stenosis neck. Moderate tandem stenosis posterior cerebral arteries. Patent dural venous sinuses.    MICROBIOLOGY: Blood Ctx 5/25 >> Urine Ctx 5/25:  E coli Trach Asp Ctx 5/26 >> MRSA PCR 5/26:  Negative   Urine Ctx 5/27:  E coli  ANTIBIOTICS: Levaquin 5/27 >>  SIGNIFICANT EVENTS: 05/26 > admitted with seizures, required intubation for airway protection.  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 ? Subacute infarction vs neoplasm.  P:  Neurology following Goal RASS:  0 to -1 Versed gtt Fentanyl gtt Daily WUA Continue Keppra. Additional AED's per neuro. Continuous EEG Continue outpatient thiamine IV daily FA IV daily Ammonia 5/29 AM  PULMONARY A: Acute Respiratory Failure - Unable to protect airway. Possible Aspiration   P:  Full vent support. VAP prevention measures. SBT & WUA daily  CARDIOVASCULAR A:  H/O HTN  P:  Monitor hemodynamics. Hold outpatient amlodipine. Vitals per unit protocol  RENAL A:  Hypomagnesemia - Replacing IV. Lactic Acidosis - Resolved. Pseudohypocalcemia - corrects to 9.68. Ionized Ca 5.1 (normal). Hematuria - Possible UTI.  P:  KVO IVF Monitoring electrolytes & renal function daily Trending UOP Magnesium Sulfate 2gm IV  GASTROINTESTINAL A:  Nutrition. H/O Liver Cirrhosis  P:  NPO Protonix IV qhs Trending LFTs intermittently  HEMATOLOGIC  A:  Anemia - No signs of active bleeding. S/P 1u PRBC 5/26. S/P 1u PRBC 5/27. S/P 1u PRBC 5/28. Coagulopathy - Likely due to cirrhosis. 2u FFP 5/27. 2u FFP 5/28. Thrombocytopenia - Mild. VTE Prophylaxis.  P:  Trending cell counts daily w/ CBC Trending Hgb/Hct q12hr Trending Coags q12hr SCDs  INFECTIOUS A:  Possible Aspiration  E coli UTI  P:  Empiric Levaquin Day #2 Follow cultures. Trending PCT per  algorithm   ENDOCRINE A:  No acute issues.  P:  Monitor glucose on BMP.  Family updated: Son updated by Dr. Jamison Neighbor via phone on 5/27.  Interdisciplinary Family Meeting v Palliative Care Meeting:  Due by: 06/01.  TODAY'S SUMMARY:  55 y.o. M with hx of HTN, admitted 05/26 after grand mal seizure. Required intubation in ED due to inability to protect airway. Continuing to hold sedation in an effort to better assess neurological function. Appreciate assistance from neurology. Continuing to monitor coagulopathy & transfuse accordingly. Empiric Levaquin for UTI. Minimizing IVF. SBT as mental status improves.   I have spent 36 minutes of critical care time today reviewing the patient's electronic medical record, discussing the plan of care at bedside with the patient's son, and caring for the patient.  Donna Christen Jamison Neighbor, M.D. [facility] Pager:  [phone] After 3pm or if no response, call [phone] 6:55 AM 05/19/2016

## 2016-05-19 NOTE — Procedures (Signed)
Electroencephalogram report- LTM  Ordering Physician : Dr. Shannan Harperinehuls    Beginning date or time: 05/18/2016 5:06PM Ending date or time: 05/19/2016 11AM  Day of study: day 1  Medications include: Per EMR  HISTORY: This 24 hours of intensive EEG monitoring with simultaneous video monitoring was performed for this patient with seizure and altered mental status. This EEG was requested to rule out subclinical electrographic seizures.  TECHNICAL DESCRIPTION:  The study consists of a continuous 16-channel multi-montage digital video EEG recording with twenty-one electrodes placed according to the International 10-20 System. Additional leads included eye leads, true temporal leads (T1, T2), and an EKG lead. Activation procedures were not done due to mental status.  REPORT: The background activity in this tracing consisted of polymorphic delta and theta activity. The activity was symmetric bilaterally, and seemed to be somewhat reactive to tactile stimuli. No focal slowing or epileptiform activity was identified.   IMPRESSION: This is an abnormal EEG due to diffuse slowing.  CLINICAL CORRELATION: This EEG is consistent with non-specific moderate to severe diffuse cerebral dysfunction. No electrographic seizures were seen.

## 2016-05-19 NOTE — Progress Notes (Signed)
05/19/16 0210  100cc of Fentanyl wasted in sink. 20cc of Versed wasted in sink. Witnessed by Carlyon ProwsShakiera Whyte RN.   Elisha HeadlandBrian Jshawn Hurta RN

## 2016-05-19 NOTE — Progress Notes (Deleted)
eLink Physician-Brief Progress Note Patient Name: Jon PatteeWayne Morgan DOB: 04/08/1961 MRN: [id number]   Date of Service  05/19/2016  HPI/Events of Note  Recurrent anemia though no obvious active bleeding hgb back to 6.9     Lab Results  Component Value Date   HGB 7.3* 05/19/2016   HGB 6.9* 05/19/2016   HGB 6.8* 05/18/2016     eICU Interventions  Transfuse 2 more units and stay 2 units ahead in case more needed tonight     Intervention Category Major Interventions: Hypertension - evaluation and management  Sandrea HughsMichael Tredarius Cobern 05/19/2016, 5:55 PM

## 2016-05-19 NOTE — Progress Notes (Signed)
eLink Physician-Brief Progress Note Patient Name: Elaina PatteeWayne Hibner DOB: [birth year] MRN: [id number]   Date of Service  05/19/2016  HPI/Events of Note  No longer on fentanyl infusion and needing sedation on vent  eICU Interventions  Try fentanyl boluses     Intervention Category Minor Interventions: Routine modifications to care plan (e.g. PRN medications for pain, fever)  Sandrea HughsMichael Dorise Gangi 05/19/2016, 9:05 PM

## 2016-05-19 NOTE — Progress Notes (Signed)
STROKE TEAM PROGRESS NOTE   HISTORY OF PRESENT ILLNESS (per record) Jon Morgan is an 55 y.o. male history of hypertension was brought [facility] the emergency room [facility] following a witnessed generalized seizure at home. Patient has no history of seizure disorder. In route to the hospital he was noted to have experienced an episode of apnea. Patient had recurrent apnea in the ED and was subsequently intubated and placed on mechanical ventilation. He was also given Keppra 500 mg IV. Sedation was attempted with propofol but was unsuccessful. He was subsequently started on fentanyl and Versed IV. No further seizure activity has been reported. CT scan of his head showed a low density medial posterior right frontal lesion. Stroke was felt to be likely. However mass lesion cannot be ruled out. Patient was [facility] for [facility] management. He had a low-grade temp of 100.3. WBC count was normal. Hemoglobin was 7.5. Prothrombin time was elevated with an INR of 2.15. No anticoagulation agent was listed with his medications. AST was 61 and total bilirubin was 4.4. MRI of his brain is pending. Serum ammonia level is also pending.   SUBJECTIVE (INTERVAL HISTORY) His family was not at the bedside.  RN at the bedside for report.  Patient remains off of sedation.  He is beginning to follow commands.  No events overnight.  Currently on CPAP trial.  OBJECTIVE Temp:  [98.3 F (36.8 C)-99.5 F (37.5 C)] 99 F (37.2 C) (05/28 1208) Pulse Rate:  [41-106] 98 (05/28 1200) Cardiac Rhythm:  [-] Normal sinus rhythm (05/28 1200) Resp:  [8-20] 12 (05/28 1200) BP: (103-135)/(57-75) 117/75 mmHg (05/28 1200) SpO2:  [94 %-100 %] 100 % (05/28 1200) FiO2 (%):  [30 %-40 %] 30 % (05/28 1200)  CBC:   Recent Labs Lab 05/18/16 0247 05/18/16 1802 05/19/16 0220  WBC 9.5  --  8.5  NEUTROABS 5.7  --  5.1  HGB 6.5* 6.8* 6.9*  HCT 20.4* 21.7* 21.4*  MCV 85.7  --  84.9  PLT 115*  --  104*    Basic Metabolic Panel:    Recent Labs Lab 05/18/16 0247 05/18/16 0249 05/19/16 0220  NA  --  137 140  K  --  4.5 4.1  CL  --  119* 118*  CO2  --  14* 16*  GLUCOSE  --  77 89  BUN  --  16 17  CREATININE  --  1.20 0.99  CALCIUM  --  8.0* 8.6*  MG 1.6*  --  2.0  PHOS  --  4.5 4.7*    Lipid Panel:     Component Value Date/Time   CHOL 59 05/18/2016 0249   TRIG 48 05/18/2016 0249   HDL <10* 05/18/2016 0249   CHOLHDL NOT CALCULATED 05/18/2016 0249   VLDL 10 05/18/2016 0249   LDLCALC NOT CALCULATED 05/18/2016 0249   HgbA1c: No results found for: HGBA1C Urine Drug Screen:     Component Value Date/Time   LABOPIA NONE DETECTED 05/17/2016 0232   COCAINSCRNUR NONE DETECTED 05/17/2016 0232   LABBENZ POSITIVE* 05/17/2016 0232   AMPHETMU NONE DETECTED 05/17/2016 0232   THCU NONE DETECTED 05/17/2016 0232   LABBARB NONE DETECTED 05/17/2016 0232      IMAGING  Dg Chest 1 View 05/16/2016   Hazy attenuation left lung base could reflect pleural effusion and/or consolidation. Possibilities include aspiration pneumonitis.   Ct Head Wo Contrast 05/16/2016   1. Ill-defined low-density focus in the upper right frontal lobe, near the vertex, measuring approximately 3  cm greatest dimension. Associated local mass effect on adjacent sulci but no midline shift or herniation. Favor subacute infarction with associated edema. Neoplastic lesion cannot be confidently excluded. Consider MRI for further characterization.  2. No intracranial hemorrhage.   Mr Laqueta Jean Wo Contrast 05/18/2016   1. 2.5 x 1.6 x 2.4 cm lesion within the parasagittal right frontoparietal region as above. This lesion is somewhat unusual, with differential considerations including a possible subacute hematoma or subacute infarction with hematoma formation. Possible intra cerebral abscess could also be considered, although this is perhaps less favored given the relative lack of significant edema or enhancement about this lesion, although an abscess could  have this appearance if the patient is immunocompromised. Similarly, underlying mass is less favored given the relative lack of edema and intrinsic enhancement. A short interval follow-up study to evaluate for interval progression/regression may be helpful for further evaluation.  2. Question mildly increased diffusion abnormality within the posterior mesial right temporal lobe/ hippocampus, which may reflect changes related to seizure and/or subacute infarction. Correlation with EEG recommended.  3. Mildly increased T1 signal intensity within the bilateral basal ganglia, most often seen with underlying intrinsic liver disease. Correlation with laboratory values suggested.  4. Mild age-related cerebral atrophy with chronic small vessel ischemic disease.   Dg Chest Port 1 View 05/18/2016   Endotracheal tube in grossly good position. Distal tip of nasogastric tube is unchanged in position, being in the expected position of the distal esophagus. Advancement is recommended.     Dg Chest Port 1 View 05/17/2016   1. Enteric tube in the distal esophagus. Recommend advancement of at least 12 cm for optimal placement.  2. Improving left lung base aeration with residual atelectasis.    MRA of Head and Neck and MRV of Head done 05/18/2016 MRA HEAD:  Exam is motion degraded. Anterior circulation without medium or large size vessel significant stenosis or occlusion.  Fetal contribution to posterior cerebral artery. Prominent infundibulum on the left.  Mild narrowing proximal cavernous segment right internal carotid artery. Middle cerebral artery branch vessel irregularity and narrowing bilaterally more notable on the left.  Ectatic vertebral arteries and basilar artery without high-grade stenosis. Left vertebral artery dominant in size.  Poor delineation left posterior inferior cerebellar artery. Moderate tandem stenosis posterior cerebral arteries more notable on the left.  Please note that MR  angiogram does not extend to the abnormality noted within the right parasagittal frontal -parietal region.  MRV HEAD:  Exam is motion degraded.  This limits evaluation however when taking current examination combined with the MR brain, major dural sinuses appear to be grossly patent with major drainage to the right.    PHYSICAL EXAM  HEENT- Normocephalic, no lesions, without obvious abnormality. Normal external eye and conjunctiva. Normal TM's bilaterally. Normal auditory canals and external ears. Normal external nose, mucus membranes and septum. Neck supple with no masses, nodes, nodules or enlargement. Cardiovascular - regular rate and rhythm, S1, S2 normal, no murmur, click, rub or gallop Lungs - chest clear, no wheezing, rales, normal symmetric air entry Abdomen - soft, non-tender; bowel sounds normal; no masses, no organomegaly Extremities - no joint deformities, effusion, or inflammation and no edema  Neurologic Examination: Patient was intubated and on mechanical ventilation. Spontaneous respirations were noted. He remains off of sedation with fentanyl and Versed IV. He follows one step commands.   Pupils were very small and equal and reacted to light was indeterminate.  Extraocular movements are limited with oculocephalic maneuvers.  Face  was symmetrical with no focal weakness.  Strong corneals and gag  Muscle tone wnl on right and flaccid on left.  There were no spontaneous movements and no abnormal posturing.  Right UE: 4/5  Right LU: 2/5; left side is plegic  Coordination and gait:  Not able to test  ASSESSMENT/PLAN Jon Morgan is a 55 y.o. male with history of hypertension presenting with a witnessed generalized seizure and respiratory distress requiring intubation and ventilation. He did not receive IV t-PA due to an elevated INR.  Stroke:  Non-dominant lesion of uncertain etiology - rule out cerebral venous thrombosis.  Resultant  Loss of consciousness  MRI - 2.5  x 1.6 x 2.4 cm lesion within the parasagittal right frontoparietal region  MRA head and neck and MRV - documented above  Carotid Doppler - refer to MRA of the neck  EEG - Abnormalities: - generalized irregular slow activity  Urine culture - pending  2D Echo - pending  LDL - Not calculated  Ammonia - 57 (H)  HIV - pending   fibrinogen - 199 (L)  HgbA1c pending  VTE prophylaxis - SCDs Diet NPO time specified  No antithrombotic prior to admission, now on No antithrombotic secondary to hemorrhage  Ongoing aggressive stroke risk factor management  Therapy recommendations: Pending  Disposition:  Pending  Hypertension  Stable  SBP goal < 180 for 24 hours - then < 169 mmHG  Hyperlipidemia  Home meds: No lipid lowering medications prior to admission  LDL not be calculated, goal < 70  Other Stroke Risk Factors  Obesity, Body mass index is 30.43 kg/(m^2).   Other Active Problems  Generalized seizures - EEG as above - now on IV Keppra 1000 mg every 12 hours - continuous EEG - pending  Fever - 99.3 now - Day # 1 IV Levaquin  Anemia  Elevated INR - daily - 2.22 ->  Intubated secondary to apnea  Mildly elevated liver function tests  Ammonia, HIV, and fibrinogen - see above  Hx of alcoholic cirrhosis  Anemia  Thrombocytopenia  Tachycardia  NPO  PLAN  Continue Keppra Hospital day # 2  Delton See PA-C [facility] Pager [phone] 05/19/2016, 12:56 PM  CRITICAL CARE NEUROLOGY ATTENDING NOTE Patient was seen and examined by me personally. I reviewed notes, independently viewed imaging studies, participated in medical decision making and plan of care. I have made additions or clarifications directly to the above note.  Documentation accurately reflects findings.  The laboratory and radiographic studies were personally reviewed by me.  ROS: pertinent positives could not be fully documented due to LOC  Assessment and plan  completed by me personally and fully documented above.  Ruled out cerebral venous thrombosis  MRA, MRV; MRA does not extend to area of ICH.  Will likely repeat MRI with contrast in 4-6 weeks and also determine if additional vascular studies required  Ammonia level reviewed  cEEG ruled out non-clinical status  Recommend continued CBC monitoring.  Apparently patient has history of bloody stool per son's report  Recommend continued close monitoring and replacement of electrolytes to prevent decreased threshold for seizure  Antibiotics as indicated.  Fluoroquinolones may decrease seizure threshold; if possible may wish to change to different option.  Currently EEG without seizure  Condition is improving   This patient is critically ill and at significant risk of neurological worsening, death and care requires constant monitoring of vital signs, hemodynamics,respiratory and cardiac monitoring, extensive review of multiple databases, frequent neurological assessment, discussion  with family, other specialists and medical decision making of high complexity.  This critical care time does not reflect procedure time, or teaching time or supervisory time of PA/NP/Med Resident etc. but could involve care discussion time.  I spent 30 minutes of Neurocritical Care time in the care of  this patient.  SIGNED BY: Dr. Sula Soda     To contact Stroke Continuity provider, please refer to [email or web address]. After hours, contact General Neurology

## 2016-05-19 NOTE — Progress Notes (Signed)
05/19/16 0107  Blood products completely administered. PT/INR and H and H to be drawn.   Elisha HeadlandBrian Rukia Mcgillivray RN

## 2016-05-20 ENCOUNTER — Other Ambulatory Visit (HOSPITAL_COMMUNITY): Payer: Medicaid Other

## 2016-05-20 DIAGNOSIS — D696 Thrombocytopenia, unspecified: Secondary | ICD-10-CM

## 2016-05-20 LAB — CBC WITH DIFFERENTIAL/PLATELET
Basophils Absolute: 0 10*3/uL (ref 0.0–0.1)
Basophils Relative: 0 %
EOS PCT: 2 %
Eosinophils Absolute: 0.1 10*3/uL (ref 0.0–0.7)
HEMATOCRIT: 23.2 % — AB (ref 39.0–52.0)
HEMOGLOBIN: 7.3 g/dL — AB (ref 13.0–17.0)
LYMPHS ABS: 1.4 10*3/uL (ref 0.7–4.0)
LYMPHS PCT: 23 %
MCH: 26.8 pg (ref 26.0–34.0)
MCHC: 31.5 g/dL (ref 30.0–36.0)
MCV: 85.3 fL (ref 78.0–100.0)
MONOS PCT: 9 %
Monocytes Absolute: 0.5 10*3/uL (ref 0.1–1.0)
NEUTROS ABS: 4.1 10*3/uL (ref 1.7–7.7)
Neutrophils Relative %: 66 %
Platelets: 103 10*3/uL — ABNORMAL LOW (ref 150–400)
RBC: 2.72 MIL/uL — AB (ref 4.22–5.81)
RDW: 21.8 % — AB (ref 11.5–15.5)
WBC: 6.1 10*3/uL (ref 4.0–10.5)

## 2016-05-20 LAB — RENAL FUNCTION PANEL
ALBUMIN: 1.9 g/dL — AB (ref 3.5–5.0)
ANION GAP: 4 — AB (ref 5–15)
BUN: 12 mg/dL (ref 6–20)
CO2: 18 mmol/L — ABNORMAL LOW (ref 22–32)
Calcium: 8.6 mg/dL — ABNORMAL LOW (ref 8.9–10.3)
Chloride: 119 mmol/L — ABNORMAL HIGH (ref 101–111)
Creatinine, Ser: 0.78 mg/dL (ref 0.61–1.24)
GFR calc Af Amer: >60 mL/min (ref >60)
Glucose, Bld: 84 mg/dL (ref 65–99)
PHOSPHORUS: 4 mg/dL (ref 2.5–4.6)
POTASSIUM: 3.8 mmol/L (ref 3.5–5.1)
Sodium: 141 mmol/L (ref 135–145)

## 2016-05-20 LAB — URINE CULTURE
Culture: >=100000 — AB
Special Requests: NORMAL

## 2016-05-20 LAB — PROTIME-INR
INR: 1.8 — ABNORMAL HIGH (ref 0.00–1.49)
INR: 1.81 — AB (ref 0.00–1.49)
PROTHROMBIN TIME: 20.9 s — AB (ref 11.6–15.2)
PROTHROMBIN TIME: 21 s — AB (ref 11.6–15.2)

## 2016-05-20 LAB — PREPARE FRESH FROZEN PLASMA
UNIT DIVISION: 0
Unit division: 0
Unit division: 0

## 2016-05-20 LAB — AMMONIA: Ammonia: 67 umol/L — ABNORMAL HIGH (ref 9–35)

## 2016-05-20 LAB — APTT
APTT: 42 s — AB (ref 24–37)
aPTT: 44 seconds — ABNORMAL HIGH (ref 24–37)

## 2016-05-20 LAB — HEMOGLOBIN AND HEMATOCRIT, BLOOD
HCT: 23 % — ABNORMAL LOW (ref 39.0–52.0)
HEMOGLOBIN: 7.3 g/dL — AB (ref 13.0–17.0)

## 2016-05-20 LAB — FIBRINOGEN
FIBRINOGEN: 233 mg/dL (ref 204–475)
Fibrinogen: 237 mg/dL (ref 204–475)

## 2016-05-20 LAB — GLUCOSE, CAPILLARY: Glucose-Capillary: 107 mg/dL — ABNORMAL HIGH (ref 65–99)

## 2016-05-20 LAB — MAGNESIUM
Magnesium: 1.7 mg/dL (ref 1.7–2.4)
Magnesium: 1.7 mg/dL (ref 1.7–2.4)

## 2016-05-20 MED ORDER — MAGNESIUM SULFATE 2 GM/50ML IV SOLN
2.0000 g | Freq: Once | INTRAVENOUS | Status: AC
  Administered 2016-05-20: 2 g via INTRAVENOUS
  Filled 2016-05-20: qty 50

## 2016-05-20 NOTE — Progress Notes (Signed)
LTM EEG discontinued at 2:30pm.

## 2016-05-20 NOTE — Progress Notes (Signed)
STROKE TEAM PROGRESS NOTE   HISTORY OF PRESENT ILLNESS (per record) Jon Morgan is an 55 y.o. male history of hypertension was brought to the emergency room at [facility] following a witnessed generalized seizure at home. Patient has no history of seizure disorder. In route to the hospital he was noted to have experienced an episode of apnea. Patient had recurrent apnea in the ED and was subsequently intubated and placed on mechanical ventilation. He was also given Keppra 500 mg IV. Sedation was attempted with propofol but was unsuccessful. He was subsequently started on fentanyl and Versed IV. No further seizure activity has been reported. CT scan of his head showed a low density medial posterior right frontal lesion. Stroke was felt to be likely. However mass lesion cannot be ruled out. Patient was transferred to [facility] for further management. He had a low-grade temp of 100.3. WBC count was normal. Hemoglobin was 7.5. Prothrombin time was elevated with an INR of 2.15. No anticoagulation agent was listed with his medications. AST was 61 and total bilirubin was 4.4. MRI of his brain is pending. Serum ammonia level is also pending.   SUBJECTIVE (INTERVAL HISTORY) His family was not at the bedside.  RN at the bedside for report.  Patient remains off of sedation.  He is now following commands.  No seizure events overnight.  Long term EEG shows mild slowing but no electrographic seizures..  OBJECTIVE Temp:  [98.9 F (37.2 C)-100.1 F (37.8 C)] 99.1 F (37.3 C) (05/29 1141) Pulse Rate:  [82-100] 95 (05/29 1300) Cardiac Rhythm:  [-] Normal sinus rhythm (05/29 1200) Resp:  [8-19] 11 (05/29 1300) BP: (112-142)/(60-84) 130/71 mmHg (05/29 1300) SpO2:  [99 %-100 %] 99 % (05/29 1300) FiO2 (%):  [30 %-40 %] 40 % (05/29 0720) Weight:  [225 lb 8.5 oz (102.3 kg)] 225 lb 8.5 oz (102.3 kg) (05/29 0500)  CBC:   Recent Labs Lab 05/19/16 0220 05/19/16 1535 05/20/16 0313  WBC 8.5  --  6.1  NEUTROABS 5.1  --  4.1  HGB  6.9* 7.3* 7.3*  HCT 21.4* 23.4* 23.2*  MCV 84.9  --  85.3  PLT 104*  --  103*    Basic Metabolic Panel:   Recent Labs Lab 05/19/16 0220 05/20/16 0313 05/20/16 0548  NA 140 141  --   K 4.1 3.8  --   CL 118* 119*  --   CO2 16* 18*  --   GLUCOSE 89 84  --   BUN 17 12  --   CREATININE 0.99 0.78  --   CALCIUM 8.6* 8.6*  --   MG 2.0 1.7 1.7  PHOS 4.7* 4.0  --     Lipid Panel:     Component Value Date/Time   CHOL 59 05/18/2016 0249   TRIG 48 05/18/2016 0249   HDL <10* 05/18/2016 0249   CHOLHDL NOT CALCULATED 05/18/2016 0249   VLDL 10 05/18/2016 0249   LDLCALC NOT CALCULATED 05/18/2016 0249   HgbA1c: No results found for: HGBA1C Urine Drug Screen:     Component Value Date/Time   LABOPIA NONE DETECTED 05/17/2016 0232   COCAINSCRNUR NONE DETECTED 05/17/2016 0232   LABBENZ POSITIVE* 05/17/2016 0232   AMPHETMU NONE DETECTED 05/17/2016 0232   THCU NONE DETECTED 05/17/2016 0232   LABBARB NONE DETECTED 05/17/2016 0232      IMAGING  Dg Chest 1 View 05/16/2016   Hazy attenuation left lung base could reflect pleural effusion and/or consolidation. Possibilities include aspiration pneumonitis.   Ct Head Wo  Contrast 05/16/2016   1. Ill-defined low-density focus in the upper right frontal lobe, near the vertex, measuring approximately 3 cm greatest dimension. Associated local mass effect on adjacent sulci but no midline shift or herniation. Favor subacute infarction with associated edema. Neoplastic lesion cannot be confidently excluded. Consider MRI for further characterization.  2. No intracranial hemorrhage.   Mr Laqueta JeanBrain W Wo Contrast 05/18/2016   1. 2.5 x 1.6 x 2.4 cm lesion within the parasagittal right frontoparietal region as above. This lesion is somewhat unusual, with differential considerations including a possible subacute hematoma or subacute infarction with hematoma formation. Possible intra cerebral abscess could also be considered, although this is perhaps less  favored given the relative lack of significant edema or enhancement about this lesion, although an abscess could have this appearance if the patient is immunocompromised. Similarly, underlying mass is less favored given the relative lack of edema and intrinsic enhancement. A short interval follow-up study to evaluate for interval progression/regression may be helpful for further evaluation.  2. Question mildly increased diffusion abnormality within the posterior mesial right temporal lobe/ hippocampus, which may reflect changes related to seizure and/or subacute infarction. Correlation with EEG recommended.  3. Mildly increased T1 signal intensity within the bilateral basal ganglia, most often seen with underlying intrinsic liver disease. Correlation with laboratory values suggested.  4. Mild age-related cerebral atrophy with chronic small vessel ischemic disease.   Dg Chest Port 1 View 05/18/2016   Endotracheal tube in grossly good position. Distal tip of nasogastric tube is unchanged in position, being in the expected position of the distal esophagus. Advancement is recommended.     Dg Chest Port 1 View 05/17/2016   1. Enteric tube in the distal esophagus. Recommend advancement of at least 12 cm for optimal placement.  2. Improving left lung base aeration with residual atelectasis.    MRA of Head and Neck and MRV of Head done 05/18/2016 MRA HEAD:  Exam is motion degraded. Anterior circulation without medium or large size vessel significant stenosis or occlusion.  Fetal contribution to posterior cerebral artery. Prominent infundibulum on the left.  Mild narrowing proximal cavernous segment right internal carotid artery. Middle cerebral artery branch vessel irregularity and narrowing bilaterally more notable on the left.  Ectatic vertebral arteries and basilar artery without high-grade stenosis. Left vertebral artery dominant in size.  Poor delineation left posterior inferior cerebellar  artery. Moderate tandem stenosis posterior cerebral arteries more notable on the left.  Please note that MR angiogram does not extend to the abnormality noted within the right parasagittal frontal -parietal region.  MRV HEAD:  Exam is motion degraded.  This limits evaluation however when taking current examination combined with the MR brain, major dural sinuses appear to be grossly patent with major drainage to the right.   Long term EEG 05/20/16 :  : Diffuse background slowing consistent with non-specific mild to moderate diffuse cerebral dysfunction. There is an improvement in the background over the duration of the recording, likely due to decrease in sedating medications. No electrographic seizures were seen. PHYSICAL EXAM  HEENT- Normocephalic, no lesions, without obvious abnormality. Normal external eye and conjunctiva. Normal TM's bilaterally. Normal auditory canals and external ears. Normal external nose, mucus membranes and septum. Neck supple with no masses, nodes, nodules or enlargement. Cardiovascular - regular rate and rhythm, S1, S2 normal, no murmur, click, rub or gallop Lungs - chest clear, no wheezing, rales, normal symmetric air entry Abdomen - soft, non-tender; bowel sounds normal; no masses, no organomegaly Extremities -  no joint deformities, effusion, or inflammation and no edema  Neurologic Examination: Patient now extubated Awake alert and interactive and follows commands consistently..   Pupils were very small and equal and reacted to light was indeterminate.  Extraocular movements are limited with oculocephalic maneuvers.  Face was symmetrical with no focal weakness.  Strong corneals and gag  Muscle tone wnl on right and flaccid on left.  There were no spontaneous movements and no abnormal posturing.  Right UE: 4/5  Right LE: 2/5; left side is plegic 0/5  Coordination and gait:  Not able to test  ASSESSMENT/PLAN Mr. Lawarence Meek is a 55 y.o. male with history  of hypertension presenting with a witnessed generalized seizure and respiratory distress requiring intubation and ventilation. He did not receive IV t-PA due to an elevated INR.  Stroke:  Non-dominant lesion of uncertain etiology - rule out cerebral venous thrombosis.  Resultant  Loss of consciousness  MRI - 2.5 x 1.6 x 2.4 cm lesion within the parasagittal right frontoparietal region  MRA head and neck and MRV - documented above  Carotid Doppler - refer to MRA of the neck  EEG - Abnormalities: - generalized irregular slow activity  Urine culture -greater than 100,000 ecoli  2D Echo - pending  LDL - Not calculated  Ammonia - 57 (H)  HIV - nonreactive   fibrinogen - 199 (L)  HgbA1c pending  VTE prophylaxis - SCDs Diet NPO time specified  No antithrombotic prior to admission, now on No antithrombotic secondary to hemorrhage  Ongoing aggressive stroke risk factor management  Therapy recommendations: Pending  Disposition:  Pending  Hypertension  Stable  SBP goal < 180 for 24 hours - then < 169 mmHG  Hyperlipidemia  Home meds: No lipid lowering medications prior to admission  LDL not be calculated, goal < 70  Other Stroke Risk Factors  Obesity, Body mass index is 30.58 kg/(m^2).   Other Active Problems  Generalized seizures - EEG as above - now on IV Keppra 1000 mg every 12 hours - continuous EEG - pending  Fever - 99.3 now - Day # 1 IV Levaquin  Anemia  Elevated INR - daily - 2.22 ->  Intubated secondary to apnea  Mildly elevated liver function tests  Ammonia, HIV, and fibrinogen - see above  Hx of alcoholic cirrhosis  Anemia  Thrombocytopenia  Tachycardia  NPO  PLAN  Continue Keppra for seizures and discontinue long term EEG recording Hospital day # 3     CRITICAL CARE NEUROLOGY ATTENDING NOTE Patient was seen and examined by me personally. I reviewed notes, independently viewed imaging studies, participated in medical decision  making and plan of care. I have made additions or clarifications directly to the above note.  Documentation accurately reflects findings.  The laboratory and radiographic studies were personally reviewed by me.  ROS: pertinent positives none  Assessment and plan completed by me personally and fully documented above. Plan dc cEEG recording and continue keppra. Condition is improving   This patient is critically ill and at significant risk of neurological worsening, death and care requires constant monitoring of vital signs, hemodynamics,respiratory and cardiac monitoring, extensive review of multiple databases, frequent neurological assessment, discussion with family, other specialists and medical decision making of high complexity.  This critical care time does not reflect procedure time, or teaching time or supervisory time of PA/NP/Med Resident etc. but could involve care discussion time.  I spent 30 minutes of Neurocritical Care time in the care of  this patient.  SIGNED BY: Dr.Shneur Whittenburg   To contact Stroke Continuity provider, please refer to [email or web address]. After hours, contact General Neurology

## 2016-05-20 NOTE — Evaluation (Signed)
Clinical/Bedside Swallow Evaluation Patient Details  Name: Jon Morgan MRN: [id number] Date of Birth: [birth year]  Today's Date: 05/20/2016 Time: SLP Start Time (ACUTE ONLY): 1537 SLP Stop Time (ACUTE ONLY): 1600 SLP Time Calculation (min) (ACUTE ONLY): 23 min  Past Medical History:  Past Medical History  Diagnosis Date  . Hypertension    Past Surgical History: History reviewed. No pertinent past surgical history. HPI:  55 year old male admitted after a seizure. Pt became unresponsive and required intubation . PMH significant for HTN. No report of swallowing difficulty. BSE ordered to evaluate swallow function and safety s/p extubation   Assessment / Plan / Recommendation Clinical Impression  Oral care was completed with suction. Pt voice quality strong and clear, poor dentition. Pt presents with adequate oral motor strength and function. No overt s/s aspiration noted on any consistency tested. Pt was noted to have difficulty with self feeding, and was noted to be impulsive with bolus size and rate. He is at increased risk for aspiration s/p intubation x 4 days. Recommend beginning Dys 2 diet with thin liquids, 1:1 supervision and assistance with feeding due to impulsivity. ST will follow for assessment of diet tolerance and readiness to advance.    Aspiration Risk  Mild aspiration risk    Diet Recommendation Dysphagia 2 (Fine chop);Thin liquid   Liquid Administration via: Straw;Cup Medication Administration: Whole meds with liquid Supervision: Staff to assist with self feeding;Full supervision/cueing for compensatory strategies Compensations: Minimize environmental distractions;Slow rate;Small sips/bites    Other  Recommendations Oral Care Recommendations: Oral care QID   Follow up Recommendations   (TBD)    Frequency and Duration min 1 x/week  2 weeks       Prognosis Prognosis for Safe Diet Advancement: Fair Barriers to Reach Goals: Cognitive deficits      Swallow Study    General Date of Onset: 05/17/16 HPI: 55 year old male admitted after a seizure. Pt became unresponsive and required intubation . PMH significant for HTN. No report of swallowing difficulty. BSE ordered to evaluate swallow function and safety s/p extubation Type of Study: Bedside Swallow Evaluation Previous Swallow Assessment: none found Diet Prior to this Study: NPO Temperature Spikes Noted: No Respiratory Status: Nasal cannula History of Recent Intubation: Yes Length of Intubations (days): 4 days Date extubated: 05/20/16 Behavior/Cognition: Alert;Cooperative;Confused;Impulsive Oral Cavity Assessment: Within Functional Limits Oral Care Completed by SLP: Yes Oral Cavity - Dentition: Missing dentition;Poor condition Vision:  (difficulty with self feeding) Self-Feeding Abilities: Needs assist Patient Positioning: Upright in bed Baseline Vocal Quality: Normal Volitional Cough: Strong Volitional Swallow: Unable to elicit    Oral/Motor/Sensory Function Overall Oral Motor/Sensory Function: Within functional limits   Ice Chips Ice chips: Within functional limits Presentation: Spoon   Thin Liquid Thin Liquid: Within functional limits Presentation: Straw    Nectar Thick Nectar Thick Liquid: Not tested   Honey Thick Honey Thick Liquid: Not tested   Puree Puree: Within functional limits Presentation: Spoon   Solid    Solid: Within functional limits    Leigh AuroraBueche, Brenan Modesto Brown 05/20/2016,4:12 PM  Merlene Laughterelia B. Murvin NatalBueche, [facility] Endoscopy CenterMSP, CCC-SLP [phone]

## 2016-05-20 NOTE — Procedures (Signed)
Extubation Procedure Note  Patient Details:   Name: Elaina PatteeWayne Aldaba DOB: [birth year] MRN: [id number]   Airway Documentation:     Evaluation  O2 sats: stable throughout Complications: No apparent complications Patient did tolerate procedure well. Bilateral Breath Sounds: Clear   Yes   Patient extubated to 2L nasal cannula per MD order.  Positive cuff leak noted.  No evidence of stridor.  Patient able to speak post extubation.  Sats currently 100%.  Vitals are stable.  Incentive spirometry performed x5 with achieved goal of 750.  No complications noted.  Durwin GlazeBrown, Diella Gillingham N 05/20/2016, 9:32 AM

## 2016-05-20 NOTE — Progress Notes (Signed)
[facility] FOR AM LAB REPLACEMENT ONLY  The patient does apply for [facility]ly[facility] Protocol based on the criteria listed below:   1. Is GFR >/= 40 ml/min? Yes.    Patient's GFR today is >60 2. Is urine output >/= 0.5 ml/kg/hr for the last 6 hours? Yes.   Patient's UOP is 0.5 ml/kg/hr 3. Is BUN < 60 mg/dL? Yes.    Patient's BUN today is 12 4. Abnormal electrolyte(s): mag 1.7 5. Ordered repletion with: per protocol 6. If a panic level lab has been reported, has the CCM MD in charge been notified? No..   Physician:    Markus DaftWHELAN, Benyamin Jeff A 05/20/2016 4:00 AM

## 2016-05-20 NOTE — Procedures (Signed)
Electroencephalogram report- LTM  Ordering Physician : Dr. Shannan Harperinehuls    Beginning date or time: 05/19/2016 11AM Ending date or time: 05/20/2016 9AM  Day of study: day 1  Medications include: Per EMR  HISTORY: This 24 hours of intensive EEG monitoring with simultaneous video monitoring was performed for this patient with seizure and altered mental status. This EEG was requested to rule out subclinical electrographic seizures.  TECHNICAL DESCRIPTION:  The study consists of a continuous 16-channel multi-montage digital video EEG recording with twenty-one electrodes placed according to the International 10-20 System. Additional leads included eye leads, true temporal leads (T1, T2), and an EKG lead. Activation procedures were not done due to mental status.  REPORT: The background activity in this tracing consisted of polymorphic delta and theta activity, that improves over the duration of the recording to low voltage fast activity. The activity was symmetric bilaterally, and reactive. No focal slowing or epileptiform activity was identified.   IMPRESSION: This is an abnormal EEG due to: Diffuse background slowing.  CLINICAL CORRELATION: This EEG is consistent with non-specific mild to moderate diffuse cerebral dysfunction. There is an improvement in the background over the duration of the recording, likely due to decrease in sedating medications. No electrographic seizures were seen.

## 2016-05-20 NOTE — Progress Notes (Signed)
Spoke with the on-call Chaplain, explained that the son was requesting the HPOA paper be filled out if possible today. The form was not here and possibly with the son. Chaplain stated that no one could notarize it today. Chaplain said it should be deferred until tomorrow.

## 2016-05-20 NOTE — Progress Notes (Signed)
PULMONARY / CRITICAL CARE MEDICINE   Name: Jon Morgan MRN: [id number] DOB: [birth year]    ADMISSION DATE:  05/17/2016 CONSULTATION DATE:  05/17/16  REFERRING MD:  EDP at [facility]  CHIEF COMPLAINT:  Seizures  HISTORY OF PRESENT ILLNESS:  Pt is encephelopathic; therefore, this HPI is obtained from chart review. Jon Morgan is a 55 y.o. male with PMH of HTN. He experienced a seizure at home on 05/25 and was subsequently transported to [facility] ED. Per EMS, pt was initially awake but later became unresponsive en route and had periods of apnea. In ED, he remained unresponsive and was unable to protect airway; therefore, he was intubated by EDP. He was then transferred to [facility] ICU for further workup and management.  SUBJECTIVE:  No acute events overnight. More alert this AM.  REVIEW OF SYSTEMS:  Unable to obtain as patient is intubated.  VITAL SIGNS: BP 123/65 mmHg  Pulse 82  Temp(Src) 100 F (37.8 C) (Oral)  Resp 14  Ht 6' (1.829 m)  Wt 225 lb 8.5 oz (102.3 kg)  BMI 30.58 kg/m2  SpO2 100%  HEMODYNAMICS:    VENTILATOR SETTINGS: Vent Mode:  [-] PRVC FiO2 (%):  [30 %-40 %] 40 % Set Rate:  [14 bmp] 14 bmp Vt Set:  [550 mL] 550 mL PEEP:  [5 cmH20] 5 cmH20 Pressure Support:  [5 cmH20] 5 cmH20 Plateau Pressure:  [10 cmH20-16 cmH20] 10 cmH20  INTAKE / OUTPUT: I/O last 3 completed shifts: In: 4679.3 [I.V.:2658.8; Blood:1440.5; IV Piggyback:580] Out: 3020 [Urine:3020]   PHYSICAL EXAMINATION: General: Sitting up in bed. No family at bedside. No distress. Neuro: Awake. Following commands. Moving right side to command. HEENT: No scleral injection. Endotracheal tube in place. Cardiovascular: Regular rate & rhythm. No appreciable JVD. Normal S1 & S2. Lungs: Coarse breath sounds bilaterally improving. Symmetric chest wall rise on ventilator. Abdomen: Soft. Nondistended. Normal bowel sounds. Integument: Warm and dry. No rash on exposed skin. Patient does have decubitus ulcers  noted.  LABS:  BMET  Recent Labs Lab 05/18/16 0249 05/19/16 0220 05/20/16 0313  NA 137 140 141  K 4.5 4.1 3.8  CL 119* 118* 119*  CO2 14* 16* 18*  BUN 16 17 12   CREATININE 1.20 0.99 0.78  GLUCOSE 77 89 84    Electrolytes  Recent Labs Lab 05/18/16 0247 05/18/16 0249 05/19/16 0220 05/20/16 0313  CALCIUM  --  8.0* 8.6* 8.6*  MG 1.6*  --  2.0 1.7  PHOS  --  4.5 4.7* 4.0    CBC  Recent Labs Lab 05/18/16 0247  05/19/16 0220 05/19/16 1535 05/20/16 0313  WBC 9.5  --  8.5  --  6.1  HGB 6.5*  < > 6.9* 7.3* 7.3*  HCT 20.4*  < > 21.4* 23.4* 23.2*  PLT 115*  --  104*  --  103*  < > = values in this interval not displayed.  Coag's  Recent Labs Lab 05/19/16 0220 05/19/16 1535 05/20/16 0313  APTT 44* 37 42*  INR 2.00* 1.65* 1.80*    Sepsis Markers  Recent Labs Lab 05/17/16 0147  05/17/16 1005 05/17/16 1536 05/17/16 2205 05/18/16 0247 05/19/16 0220  LATICACIDVEN 1.7  < > 1.5 1.3 1.7  --   --   PROCALCITON <0.10  --   --   --   --  0.10 <0.10  < > = values in this interval not displayed.  ABG  Recent Labs Lab 05/16/16 2018 05/17/16 0350  PHART 7.35 7.489*  PCO2ART 30* 20.3*  PO2ART 113* 254*    Liver Enzymes  Recent Labs Lab 05/16/16 2000 05/18/16 0249 05/19/16 0220 05/20/16 0313  AST 61*  --  37  --   ALT 33  --  24  --   ALKPHOS 61  --  41  --   BILITOT 4.4*  --  6.1*  --   ALBUMIN 1.9* 1.5* 1.7*  1.7* 1.9*    Cardiac Enzymes  Recent Labs Lab 05/17/16 0147  TROPONINI <0.03    Glucose  Recent Labs Lab 05/17/16 0133 05/17/16 2310 05/18/16 1152  GLUCAP 104* 86 82    Imaging No results found.   STUDIES:  CXR 05/25: opacity in left base. CT head 05/25: possible subacute infarction with associated edema. Neoplastic lesion can not be excluded. No hemorrhage. Port CXR 5/26:  Mild left basilar opacity. ETT in good position. OGT in distal esophagus. MRI brain 05/27: 2.5x1.6x2.4 parasagittal right frontoparietal  lesion possible subacute hematoma or subacute infarction w/ hematoma. Less likely cerebral abscess or mass.Increased diffusion abnormality right temporal lobe/hippocampus possibly due to seizure and/or subacute infarct. Atrophy w/ small vessel disease. Port CXR 5/27:  ETT in good position. OGT in distal esophagus.  MRA/MRV 5/27:  No high grade stenosis neck. Moderate tandem stenosis posterior cerebral arteries. Patent dural venous sinuses.    MICROBIOLOGY: Blood Ctx 5/25 >> Urine Ctx 5/25:  E coli Trach Asp Ctx 5/26 >> MRSA PCR 5/26:  Negative   Urine Ctx 5/27:  E coli  ANTIBIOTICS: Levaquin 5/27 >>  SIGNIFICANT EVENTS: 05/26 > admitted with seizures, required intubation for airway protection.  LINES/TUBES: OETT 8.0 05/25 > Foley 5/25 > OGT 5/25 > PIV x3  ASSESSMENT / PLAN:  NEUROLOGIC A:  Acute Encephalopathy - Likely secondary to seizure. Ammonia 67 5/29. 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 ? Subacute infarction vs neoplasm.  P:  Neurology following Goal RASS:  0 to -1 Versed gtt Fentanyl gtt Daily WUA Continue Keppra. Additional AED's per neuro. Continuous EEG Continue outpatient thiamine IV daily FA IV daily PT/OT consult  PULMONARY A: Acute Respiratory Failure - Unable to protect airway. Possible Aspiration   P:  Full vent support. VAP prevention measures. Plan for extubation today IS post extubation q1hr  CARDIOVASCULAR A:  H/O HTN  P:  Monitor hemodynamics. Hold outpatient amlodipine. Vitals per unit protocol  RENAL A:  Hyperchloremia Hypomagnesemia - Resolved. Lactic Acidosis - Resolved. Pseudohypocalcemia - corrects to 9.68. Ionized Ca 5.1 (normal). Hematuria - Possible UTI. Resolved.  P:  KVO IVF Monitoring electrolytes & renal function daily Trending UOP  GASTROINTESTINAL A:  Nutrition. H/O Liver Cirrhosis  P:  NPO Protonix IV qhs Holding tube feeds Speech Eval  HEMATOLOGIC A:  Anemia - No  signs of active bleeding & Hgb stable. S/P 1u PRBC 5/26. S/P 1u PRBC 5/27. S/P 1u PRBC 5/28. Coagulopathy - Likely due to cirrhosis. 2u FFP 5/27. 2u FFP 5/28. Improved. Thrombocytopenia - Mild. VTE Prophylaxis.  P:  Trending cell counts daily w/ CBC Trending Hgb/Hct q12hr Trending Coags q12hr SCDs  INFECTIOUS A:  Possible Aspiration  E coli UTI - Sensitive to Cipro.  P:  Empiric Levaquin Day #3 Follow cultures. Trending PCT per algorithm   ENDOCRINE A:  No acute issues.  P:  Monitor glucose on BMP.  Family updated: Son updated by via phone by nurse 5/29.  Interdisciplinary Family Meeting v Palliative Care Meeting:  Due by: 06/01.  TODAY'S SUMMARY:  55 y.o. M with hx of HTN, admitted 05/26 after  grand mal seizure. Required intubation in ED due to inability to protect airway. Continuing to hold sedation in an effort to better assess neurological function. Appreciate assistance from neurology. Empiric Levaquin continuing today. Extubating today. PT/OT/Speech consult today.  I have spent 32 minutes of critical care time today reviewing the patient's electronic medical record and caring for the patient.  Donna Christen Jamison Neighbor, M.D. [facility] Pulmonary & Critical Care Pager:  [phone] After 3pm or if no response, call [phone] 6:59 AM 05/20/2016

## 2016-05-21 ENCOUNTER — Inpatient Hospital Stay (HOSPITAL_COMMUNITY): Payer: Medicaid Other

## 2016-05-21 DIAGNOSIS — I6789 Other cerebrovascular disease: Secondary | ICD-10-CM

## 2016-05-21 DIAGNOSIS — I10 Essential (primary) hypertension: Secondary | ICD-10-CM

## 2016-05-21 DIAGNOSIS — Z4659 Encounter for fitting and adjustment of other gastrointestinal appliance and device: Secondary | ICD-10-CM | POA: Insufficient documentation

## 2016-05-21 DIAGNOSIS — I6330 Cerebral infarction due to thrombosis of unspecified cerebral artery: Secondary | ICD-10-CM

## 2016-05-21 LAB — RENAL FUNCTION PANEL
ANION GAP: 3 — AB (ref 5–15)
Albumin: 1.8 g/dL — ABNORMAL LOW (ref 3.5–5.0)
BUN: 7 mg/dL (ref 6–20)
CHLORIDE: 117 mmol/L — AB (ref 101–111)
CO2: 21 mmol/L — AB (ref 22–32)
Calcium: 8.6 mg/dL — ABNORMAL LOW (ref 8.9–10.3)
Creatinine, Ser: 0.74 mg/dL (ref 0.61–1.24)
GFR calc Af Amer: >60 mL/min (ref >60)
GFR calc non Af Amer: >60 mL/min (ref >60)
GLUCOSE: 84 mg/dL (ref 65–99)
POTASSIUM: 3.4 mmol/L — AB (ref 3.5–5.1)
Phosphorus: 2.8 mg/dL (ref 2.5–4.6)
Sodium: 141 mmol/L (ref 135–145)

## 2016-05-21 LAB — CBC WITH DIFFERENTIAL/PLATELET
BASOS ABS: 0 10*3/uL (ref 0.0–0.1)
BASOS PCT: 0 %
Basophils Absolute: 0 10*3/uL (ref 0.0–0.1)
Basophils Relative: 0 %
EOS ABS: 0.1 10*3/uL (ref 0.0–0.7)
EOS ABS: 0.1 10*3/uL (ref 0.0–0.7)
EOS PCT: 2 %
Eosinophils Relative: 2 %
HCT: 22.3 % — ABNORMAL LOW (ref 39.0–52.0)
HEMATOCRIT: 24.3 % — AB (ref 39.0–52.0)
HEMOGLOBIN: 7.6 g/dL — AB (ref 13.0–17.0)
Hemoglobin: 7 g/dL — ABNORMAL LOW (ref 13.0–17.0)
LYMPHS PCT: 22 %
Lymphocytes Relative: 22 %
Lymphs Abs: 1.3 10*3/uL (ref 0.7–4.0)
Lymphs Abs: 1.4 10*3/uL (ref 0.7–4.0)
MCH: 26.8 pg (ref 26.0–34.0)
MCH: 27.4 pg (ref 26.0–34.0)
MCHC: 31.4 g/dL (ref 30.0–36.0)
MCHC: 31.3 g/dL (ref 30.0–36.0)
MCV: 85.4 fL (ref 78.0–100.0)
MCV: 87.7 fL (ref 78.0–100.0)
MONO ABS: 0.6 10*3/uL (ref 0.1–1.0)
MONOS PCT: 10 %
Monocytes Absolute: 0.6 10*3/uL (ref 0.1–1.0)
Monocytes Relative: 10 %
NEUTROS ABS: 4.2 10*3/uL (ref 1.7–7.7)
NEUTROS PCT: 66 %
NEUTROS PCT: 66 %
Neutro Abs: 3.7 10*3/uL (ref 1.7–7.7)
PLATELETS: 98 10*3/uL — AB (ref 150–400)
Platelets: 99 10*3/uL — ABNORMAL LOW (ref 150–400)
RBC: 2.61 MIL/uL — AB (ref 4.22–5.81)
RBC: 2.77 MIL/uL — ABNORMAL LOW (ref 4.22–5.81)
RDW: 22.1 % — AB (ref 11.5–15.5)
RDW: 22.3 % — ABNORMAL HIGH (ref 11.5–15.5)
WBC: 5.7 10*3/uL (ref 4.0–10.5)
WBC: 6.3 10*3/uL (ref 4.0–10.5)

## 2016-05-21 LAB — CBC
HCT: 22.8 % — ABNORMAL LOW (ref 39.0–52.0)
Hemoglobin: 7.1 g/dL — ABNORMAL LOW (ref 13.0–17.0)
MCH: 27 pg (ref 26.0–34.0)
MCHC: 31.1 g/dL (ref 30.0–36.0)
MCV: 86.7 fL (ref 78.0–100.0)
PLATELETS: 91 10*3/uL — AB (ref 150–400)
RBC: 2.63 MIL/uL — AB (ref 4.22–5.81)
RDW: 22.4 % — ABNORMAL HIGH (ref 11.5–15.5)
WBC: 5.4 10*3/uL (ref 4.0–10.5)

## 2016-05-21 LAB — TYPE AND SCREEN
ABO/RH(D): O POS
ABO/RH(D): O POS
ANTIBODY SCREEN: NEGATIVE
Antibody Screen: NEGATIVE
UNIT DIVISION: 0
UNIT DIVISION: 0
UNIT DIVISION: 0
Unit division: 0
Unit division: 0
Unit division: 0

## 2016-05-21 LAB — CULTURE, BLOOD (ROUTINE X 2)
CULTURE: NO GROWTH
Culture: NO GROWTH

## 2016-05-21 LAB — PROTIME-INR
INR: 1.89 — AB (ref 0.00–1.49)
INR: 1.83 — AB (ref 0.00–1.49)
PROTHROMBIN TIME: 21.6 s — AB (ref 11.6–15.2)
PROTHROMBIN TIME: 21.1 s — AB (ref 11.6–15.2)

## 2016-05-21 LAB — ECHOCARDIOGRAM COMPLETE
HEIGHTINCHES: 72 in
Weight: 3559.11 oz

## 2016-05-21 LAB — APTT
aPTT: 45 seconds — ABNORMAL HIGH (ref 24–37)
aPTT: 43 seconds — ABNORMAL HIGH (ref 24–37)

## 2016-05-21 LAB — FIBRINOGEN: FIBRINOGEN: 227 mg/dL (ref 204–475)

## 2016-05-21 LAB — HEMOGLOBIN A1C
Hgb A1c MFr Bld: 5.2 % (ref 4.8–5.6)
Mean Plasma Glucose: 103 mg/dL

## 2016-05-21 LAB — MAGNESIUM: MAGNESIUM: 1.6 mg/dL — AB (ref 1.7–2.4)

## 2016-05-21 MED ORDER — POTASSIUM CHLORIDE 10 MEQ/100ML IV SOLN
10.0000 meq | INTRAVENOUS | Status: AC
  Administered 2016-05-21 (×2): 10 meq via INTRAVENOUS
  Filled 2016-05-21 (×2): qty 100

## 2016-05-21 MED ORDER — SODIUM CHLORIDE 0.9 % IV SOLN
Freq: Once | INTRAVENOUS | Status: AC
  Administered 2016-05-21: 11:00:00 via INTRAVENOUS

## 2016-05-21 MED ORDER — MAGNESIUM SULFATE 2 GM/50ML IV SOLN
2.0000 g | Freq: Once | INTRAVENOUS | Status: AC
  Administered 2016-05-21: 2 g via INTRAVENOUS
  Filled 2016-05-21: qty 50

## 2016-05-21 MED ORDER — PANTOPRAZOLE SODIUM 40 MG IV SOLR
40.0000 mg | Freq: Two times a day (BID) | INTRAVENOUS | Status: DC
  Administered 2016-05-21 – 2016-05-22 (×4): 40 mg via INTRAVENOUS
  Filled 2016-05-21 (×5): qty 40

## 2016-05-21 MED ORDER — DEXTROSE 5 % IV SOLN
1.0000 g | INTRAVENOUS | Status: AC
  Administered 2016-05-21 – 2016-05-24 (×4): 1 g via INTRAVENOUS
  Filled 2016-05-21 (×4): qty 10

## 2016-05-21 NOTE — Evaluation (Signed)
Occupational Therapy Evaluation Patient Details Name: Jon Morgan MRN: [id number] DOB: [birth year] Today's Date: 05/21/2016    History of Present Illness Pt adm with seizure. Pt intubated 5/26-5/29. Pt with encephalopathy. MRI showed a low density medial posterior right frontal lesion ?etiology. PMH - HTN   Clinical Impression   Pt admitted with above. He demonstrates the below listed deficits and will benefit from continued OT to maximize safety and independence with BADLs.  Pt presents to OT with Lt hemiparesis, Lt neglect, impaired cognition, impaired balance.  He requires max - total A for ADLs.  He will require SNF at discharge.       Follow Up Recommendations  SNF    Equipment Recommendations  None recommended by OT    Recommendations for Other Services       Precautions / Restrictions Precautions Precautions: Fall Restrictions Weight Bearing Restrictions: No      Mobility Bed Mobility Overal bed mobility: Needs Assistance Bed Mobility: Rolling Rolling: Max assist            Transfers                      Balance       Sitting balance - Comments: Did not attempt as nursing was preparing to transfer pt to the floor                                     ADL Overall ADL's : Needs assistance/impaired     Grooming: Wash/dry hands;Wash/dry face;Moderate assistance;Bed level   Upper Body Bathing: Maximal assistance;Bed level   Lower Body Bathing: Total assistance;Bed level   Upper Body Dressing : Total assistance;Bed level   Lower Body Dressing: Total assistance;Bed level   Toilet Transfer: Total assistance Toilet Transfer Details (indicate cue type and reason): unable to safely attempt  Toileting- Clothing Manipulation and Hygiene: Total assistance;Bed level       Functional mobility during ADLs: Total assistance General ADL Comments: Pt difficult to engage due to distractability      Vision Additional Comments: Pt  unable to provide visual history, and he is unable to participate in formal visual assessment.  He will look to the left with prompting, but does not do so spontaneously.  He will locate visual stimuli presented in Lt field.  He reports he does not read.  He was able to identify the clock on the wall and reversed the hands, but able to make out the numbers, but not able to correctly identify numbers on the calendar    Perception Perception Perception Tested?: Yes Perception Deficits: Inattention/neglect Inattention/Neglect: Does not attend to right visual field;Does not attend to left side of body   Praxis Praxis Praxis tested?: Deficits Deficits: Ideomotor;Initiation    Pertinent Vitals/Pain Pain Assessment: No/denies pain     Hand Dominance Right   Extremity/Trunk Assessment Upper Extremity Assessment Upper Extremity Assessment: LUE deficits/detail LUE Deficits / Details: Pt appears to have Lt neglect. He does not spontaneously attempt to use Lt. UE, but with max prompting, he can wiggle fingers during functional activity, he is able to assist minimally with lifting Lt UE, and is able to open hand partially  LUE Coordination: decreased fine motor;decreased gross motor   Lower Extremity Assessment Lower Extremity Assessment: Defer to PT evaluation       Communication Communication Communication: No difficulties   Cognition Arousal/Alertness: Awake/alert Behavior During Therapy: [facility] HospitalWFL for  tasks assessed/performed Overall Cognitive Status: No family/caregiver present to determine baseline cognitive functioning Area of Impairment: Orientation;Attention;Memory;Following commands;Safety/judgement;Awareness;Problem solving Orientation Level: Disoriented to;Place;Time;Situation Current Attention Level: Focused;Sustained Memory: Decreased short-term memory Following Commands: Follows one step commands inconsistently Safety/Judgement: Decreased awareness of safety;Decreased awareness of  deficits   Problem Solving: Slow processing;Decreased initiation;Difficulty sequencing;Requires verbal cues;Requires tactile cues General Comments: Pt noted to be confatbulatory and hallucinating.  Insisting there is tea by his bedside when in fact there is nothing    General Comments       Exercises       Shoulder Instructions      Home Living Family/patient expects to be discharged to:: Skilled nursing facility                                        Prior Functioning/Environment          Comments: Per chart, pt was in SNF.  In care everywheare, it appears that pt may have had ICH in 4/17, unsure if that is the cause of SNF placement     OT Diagnosis: Generalized weakness;Cognitive deficits;Disturbance of vision;Hemiplegia non-dominant side;Apraxia   OT Problem List: Decreased strength;Decreased range of motion;Decreased activity tolerance;Impaired balance (sitting and/or standing);Impaired vision/perception;Decreased coordination;Decreased cognition;Decreased safety awareness;Decreased knowledge of use of DME or AE;Impaired tone;Impaired sensation;Impaired UE functional use   OT Treatment/Interventions: Self-care/ADL training;Neuromuscular education;DME and/or AE instruction;Therapeutic activities;Cognitive remediation/compensation;Visual/perceptual remediation/compensation;Patient/family education;Balance training    OT Goals(Current goals can be found in the care plan section) Acute Rehab OT Goals Patient Stated Goal: Pt didn't state OT Goal Formulation: With patient Time For Goal Achievement: 06/04/16 Potential to Achieve Goals: Good  OT Frequency: Min 2X/week   Barriers to D/C: Decreased caregiver support          Co-evaluation              End of Session Nurse Communication: Mobility status  Activity Tolerance: Patient tolerated treatment well Patient left: in bed;with call bell/phone within reach   Time: 7829-5621 OT Time Calculation  (min): 21 min Charges:  OT General Charges $OT Visit: 1 Procedure OT Evaluation $OT Eval Moderate Complexity: 1 Procedure G-Codes:    Jeani Hawking M 2016/06/06, 4:39 PM

## 2016-05-21 NOTE — Progress Notes (Signed)
STROKE TEAM PROGRESS NOTE   SUBJECTIVE (INTERVAL HISTORY) Patient is sitting up at the bedside. He is interactive. He has persistent left hemiparesis. His blood pressure is adequately controlled.He is afebrile and white count is normal   OBJECTIVE Temp:  [98.2 F (36.8 C)-99.5 F (37.5 C)] 99.5 F (37.5 C) (05/30 0802) Pulse Rate:  [75-183] 87 (05/30 1000) Cardiac Rhythm:  [-] Normal sinus rhythm (05/30 0800) Resp:  [11-20] 15 (05/30 1000) BP: (115-134)/(64-80) 126/76 mmHg (05/30 1000) SpO2:  [98 %-100 %] 100 % (05/30 1000) Weight:  [100.9 kg (222 lb 7.1 oz)] 100.9 kg (222 lb 7.1 oz) (05/30 0500)  CBC:   Recent Labs Lab 05/20/16 0313 05/20/16 1442 05/21/16 0349  WBC 6.1  --  5.7  NEUTROABS 4.1  --  3.7  HGB 7.3* 7.3* 7.0*  HCT 23.2* 23.0* 22.3*  MCV 85.3  --  85.4  PLT 103*  --  98*    Basic Metabolic Panel:   Recent Labs Lab 05/20/16 0313 05/20/16 0548 05/21/16 0349  NA 141  --  141  K 3.8  --  3.4*  CL 119*  --  117*  CO2 18*  --  21*  GLUCOSE 84  --  84  BUN 12  --  7  CREATININE 0.78  --  0.74  CALCIUM 8.6*  --  8.6*  MG 1.7 1.7 1.6*  PHOS 4.0  --  2.8    Lipid Panel:     Component Value Date/Time   CHOL 59 05/18/2016 0249   TRIG 48 05/18/2016 0249   HDL <10* 05/18/2016 0249   CHOLHDL NOT CALCULATED 05/18/2016 0249   VLDL 10 05/18/2016 0249   LDLCALC NOT CALCULATED 05/18/2016 0249   HgbA1c:  Lab Results  Component Value Date   HGBA1C 5.2 05/18/2016   Urine Drug Screen:     Component Value Date/Time   LABOPIA NONE DETECTED 05/17/2016 0232   COCAINSCRNUR NONE DETECTED 05/17/2016 0232   LABBENZ POSITIVE* 05/17/2016 0232   AMPHETMU NONE DETECTED 05/17/2016 0232   THCU NONE DETECTED 05/17/2016 0232   LABBARB NONE DETECTED 05/17/2016 0232      IMAGING No results found.    PHYSICAL EXAM HEENT- Normocephalic, no lesions, without obvious abnormality. Normal external eye and conjunctiva. Normal TM's bilaterally. Normal auditory canals  and external ears. Normal external nose, mucus membranes and septum. Neck supple with no masses, nodes, nodules or enlargement. Cardiovascular - regular rate and rhythm, S1, S2 normal, no murmur, click, rub or gallop Lungs - chest clear, no wheezing, rales, normal symmetric air entry Abdomen - soft, non-tender; bowel sounds normal; no masses, no organomegaly Extremities - no joint deformities, effusion, or inflammation and no edema  Neurologic Examination: Patient Sitting in bedside chair. Awake alert and interactive and follows commands consistently.. Disoriented to time. Oriented to place and person. No aphasia, apraxia dysarthria Pupils were very small and equal and reacted to light was indeterminate.  Extraocular movements are limited with oculocephalic maneuvers.  Face was symmetrical with no focal weakness.  Strong corneals and gag  Muscle tone wnl on right and flaccid on left.  There were no spontaneous movements and no abnormal posturing.  Right UE: 4/5  Right LE: 2/5; left side is plegic 0/5  Coordination and gait:  Not able to test   ASSESSMENT/PLAN Mr. Jon Morgan is a 55 y.o. male with history of hypertension presenting with a witnessed generalized seizure and respiratory distress requiring intubation and ventilation. He did not receive IV t-PA due  to an elevated INR.  Stroke:  Non-dominant lesion of uncertain etiology -Hemorrhagic infarct versus primary hematoma  Resultant  Loss of consciousness  MRI - 2.5 x 1.6 x 2.4 cm hemorrhagic lesion within the parasagittal right frontoparietal region  MRA head and neck and MRV - documented above  Carotid Doppler - refer to MRA of the neck  2D Echo -EF 65-70%,   LDL - Not calculated  Ammonia - 57 (H)  HIV - nonreactive   fibrinogen - 199 (L)  HgbA1c 5.2  VTE prophylaxis - SCDs DIET DYS 2 Room service appropriate?: Yes; Fluid consistency:: Thin  No antithrombotic prior to admission, now on No antithrombotic secondary to  hemorrhage  Ongoing aggressive stroke risk factor management  Therapy recommendations: SNF  Disposition:  SNF  Seizures  EEG - Abnormalities: - generalized irregular slow activity  LT EEG - Diffuse background slowing consistent with improvement over the duration, due to decrease in sedating medications. No seizures.  Continue Keppra 1000 q 12h  discontinue long term EEG recording  Hypertension  Stable  Hyperlipidemia  Home meds: No lipid lowering medications prior to admission  LDL not be calculated, goal < 70. Chol 59  No statin indicated  Other Stroke Risk Factors  Obesity, Body mass index is 30.16 kg/(m^2).   Hx ETOH abuse  Hx stroke April/May 2017 per son  Other Active Problems  Respiratory failure, intubated  Anemia s/p transfuson x 3 this admission  Cardiomyopathy d/t cirrhosis, s/p 2u FFP  Elevated INR - daily - 1.89  Mildly elevated liver function tests  Hx of alcoholic cirrhosis  Anemia  Thrombocytopenia  Tachycardia  Urine culture -greater than 100,000 [facility] Ms day # 4  I have personally examined this patient, reviewed notes, independently viewed imaging studies, participated in medical decision making and plan of care. I have made any additions or clarifications directly to the above note. Agree with note above.  Patient can be transferred to the floor. Discussed with critical care physician Asst.  Delia Heady, MD Medical Director [facility] Pager: [phone] 05/21/2016 1:28 PM    To contact Stroke Continuity provider, please refer to [email or web address]. After hours, contact General Neurology

## 2016-05-21 NOTE — Progress Notes (Signed)
  Echocardiogram 2D Echocardiogram has been performed.  Jon Morgan, [name] 05/21/2016, 8:47 AM

## 2016-05-21 NOTE — Progress Notes (Signed)
CSW attempted Patient's son, Elaina PatteeWayne Renaldo [phone]) via T/C to conduct assessment and to discuss disposition back to [facility] when Patient is medically stable and cleared for discharge. FL-2 and PASRR completed and sent to [facility] for review. CSW will continue to follow for disposition.      Lance MussAshley Gardner,MSW, LCSW [facility] ED/5M Clinical Social Worker [phone]

## 2016-05-21 NOTE — Progress Notes (Signed)
[facility] FOR AM LAB REPLACEMENT ONLY  The patient does apply for the [facility] Electrolyte [facility]placment Protocol based on the criteria listed below:   1. Is GFR >/= 40 ml/min? Yes.    Patient's GFR today is >60 2. Is urine output >/= 0.5 ml/kg/hr for the last 6 hours? Yes.   Patient's UOP is 0.5 ml/kg/hr 3. Is BUN < 60 mg/dL? Yes.    Patient's BUN today is 7 4. Abnormal electrolyte(s): K 3.4, mag 1.6 5. Ordered repletion with: per protocol 6. If Morgan panic level lab has been reported, has the CCM MD in charge been notified? No..   Physician:    Markus DaftWHELAN, Jon Morgan 05/21/2016 5:46 AM

## 2016-05-21 NOTE — Evaluation (Addendum)
Physical Therapy Evaluation Patient Details Name: Jon Morgan MRN: [id number] DOB: 06-24-1961 Today's Date: 05/21/2016   History of Present Illness  Pt adm with seizure. Pt intubated 5/26-5/29. Pt with encephalopathy. MRI showed a low density medial posterior right frontal lesion ?etiology. PMH - HTN  Clinical Impression  Pt admitted with above diagnosis and presents to PT with functional limitations due to deficits listed below (See PT problem list). Pt needs skilled PT to maximize independence and safety to allow discharge to SNF. Not sure of pt's prior level of function and why he was at [facility].    Follow Up Recommendations SNF    Equipment Recommendations  Other (comment) (To be assessed)    Recommendations for Other Services       Precautions / Restrictions Precautions Precautions: Fall Restrictions Weight Bearing Restrictions: No      Mobility  Bed Mobility Overal bed mobility: Needs Assistance Bed Mobility: Rolling;Supine to Sit;Sit to Supine Rolling: +2 for physical assistance;Total assist   Supine to sit: +2 for physical assistance;Total assist Sit to supine: +2 for physical assistance;Total assist   General bed mobility comments: Assist to move legs off bed and elevate trunk into sitting. Assist to lower trunk and bring legs back into bed. Pt with posterior lean with trunk.  Transfers Overall transfer level: Needs assistance               General transfer comment: Transferred to recliner with maxisky but then had to transfer right back to bed due to incontinent of large stool.  Ambulation/Gait                Stairs            Wheelchair Mobility    Modified Rankin (Stroke Patients Only) Modified Rankin (Stroke Patients Only) Pre-Morbid Rankin Score:  (unknown) Modified Rankin: Severe disability     Balance Overall balance assessment: Needs assistance Sitting-balance support: Single extremity supported;Feet  supported Sitting balance-Leahy Scale: Zero Sitting balance - Comments: Pt sat EOB 7-8 minutes with +2 max A due to heavy posterior lean. Pt holding end of bed with RUE but still unable to maintain any balance. Attempted bringing pt forward by having him bring rt arm in front of him but with no improvement Postural control: Posterior lean                                   Pertinent Vitals/Pain Pain Assessment: No/denies pain    Home Living Family/patient expects to be discharged to:: Skilled nursing facility                      Prior Function           Comments: Unknown. Per one ED note pt from [facility]. Unsure why pt is at a facility. Only medical history is HTN.     Hand Dominance        Extremity/Trunk Assessment   Upper Extremity Assessment: Defer to OT evaluation           Lower Extremity Assessment: LLE deficits/detail   LLE Deficits / Details: No active movement noted either to command or spontaneously     Communication      Cognition Arousal/Alertness: Awake/alert Behavior During Therapy: WFL for tasks assessed/performed Overall Cognitive Status: No family/caregiver present to determine baseline cognitive functioning Area of Impairment: Orientation;Attention;Memory;Following commands;Safety/judgement;Problem solving Orientation Level: Disoriented to;Place;Time;Situation Current Attention Level: Focused  Memory: Decreased short-term memory;Decreased recall of precautions Following Commands: Follows one step commands inconsistently Safety/Judgement: Decreased awareness of safety;Decreased awareness of deficits   Problem Solving: Slow processing;Decreased initiation;Requires verbal cues;Requires tactile cues General Comments: Pt with left neglect    General Comments      Exercises        Assessment/Plan    PT Assessment Patient needs continued PT services  PT Diagnosis Hemiplegia dominant side;Difficulty  walking;Altered mental status   PT Problem List Decreased strength;Decreased activity tolerance;Decreased balance;Decreased mobility;Decreased cognition;Decreased knowledge of use of DME;Decreased safety awareness;Decreased knowledge of precautions  PT Treatment Interventions DME instruction;Functional mobility training;Therapeutic activities;Therapeutic exercise;Balance training;Neuromuscular re-education;Cognitive remediation;Patient/family education   PT Goals (Current goals can be found in the Care Plan section) Acute Rehab PT Goals Patient Stated Goal: Pt didn't state PT Goal Formulation: Patient unable to participate in goal setting Time For Goal Achievement: 06/04/16 Potential to Achieve Goals: Fair    Frequency Min 3X/week   Barriers to discharge   Unsure of prior level and why he was at a facility    Co-evaluation               End of Session Equipment Utilized During Treatment: Other (comment) (maxisky) Activity Tolerance: Patient tolerated treatment well Patient left: in bed;with call bell/phone within reach;with bed alarm set Nurse Communication: Mobility status;Need for lift equipment;Other (comment) (Incontinent of stool)         Time: 0454-0981 PT Time Calculation (min) (ACUTE ONLY): 25 min   Charges:   PT Evaluation $PT Eval Moderate Complexity: 1 Procedure PT Treatments $Therapeutic Activity: 8-22 mins   PT G Codes:        Jon Morgan 06/14/2016, 11:25 AM Jon Morgan PT [phone]

## 2016-05-21 NOTE — Care Management Note (Signed)
Case Management Note  Patient Details  Name: Elaina PatteeWayne Tutson MRN: [id number] Date of Birth: 1961-03-28  Subjective/Objective:  Patient is from a SNF, CSW following.  NCM will cont to follow for dc needs.                  Action/Plan:   Expected Discharge Date:                  Expected Discharge Plan:  Skilled Nursing Facility  In-House Referral:  Clinical Social Work  Discharge planning Services  CM Consult  Post Acute Care Choice:    Choice offered to:     DME Arranged:    DME Agency:     HH Arranged:    HH Agency:     Status of Service:  In process, will continue to follow  Medicare Important Message Given:    Date Medicare IM Given:    Medicare IM give by:    Date Additional Medicare IM Given:    Additional Medicare Important Message give by:     If discussed at Long Length of Stay Meetings, dates discussed:    Additional Comments:  Leone Havenaylor, Deshunda Thackston Clinton, RN 05/21/2016, 9:50 PM

## 2016-05-21 NOTE — Progress Notes (Signed)
PULMONARY / CRITICAL CARE MEDICINE   Name: Jon Morgan MRN: [id number] DOB: 03-May-1961    ADMISSION DATE:  05/17/2016 CONSULTATION DATE:  05/17/16  REFERRING MD:  EDP at [facility]  CHIEF COMPLAINT:  Seizures  HISTORY OF PRESENT ILLNESS:  Pt is encephelopathic; therefore, this HPI is obtained from chart review. Jon Morgan is a 55 y.o. male with PMH of HTN. He experienced a seizure at home on 05/25 and was subsequently transported to [facility] ED. Per EMS, pt was initially awake but later became unresponsive en route and had periods of apnea. In ED, he remained unresponsive and was unable to protect airway; therefore, he was intubated by EDP. He was then transferred to [facility] ICU for further workup and management.  SUBJECTIVE:  Bleeding this am    VITAL SIGNS: BP 134/74 mmHg  Pulse 87  Temp(Src) 99.5 F (37.5 C) (Oral)  Resp 14  Ht 6' (1.829 m)  Wt 100.9 kg (222 lb 7.1 oz)  BMI 30.16 kg/m2  SpO2 100%  HEMODYNAMICS:    VENTILATOR SETTINGS:    INTAKE / OUTPUT: I/O last 3 completed shifts: In: 1190 [I.V.:360; IV Piggyback:830] Out: 2905 [Urine:2855; Emesis/NG output:50]   PHYSICAL EXAMINATION: General: flat in bed Neuro: Awake. Following commands. Moving right side to command. HEENT: wnl Cardiovascular: s1 s2 RRR Lungs: Coarse  Abdomen: Soft. Nondistended. Normal bowel sounds. Integument: Warm and dry. No rash on exposed skin. Patient does have decubitus ulcers noted.  LABS:  BMET  Recent Labs Lab 05/19/16 0220 05/20/16 0313 05/21/16 0349  NA 140 141 141  K 4.1 3.8 3.4*  CL 118* 119* 117*  CO2 16* 18* 21*  BUN CREATININE 0.99 0.78 0.74  GLUCOSE 89 84 84    Electrolytes  Recent Labs Lab 05/19/16 0220 05/20/16 0313 05/20/16 0548 05/21/16 0349  CALCIUM 8.6* 8.6*  --  8.6*  MG 2.0 1.7 1.7 1.6*  PHOS 4.7* 4.0  --  2.8    CBC  Recent Labs Lab 05/19/16 0220  05/20/16 0313 05/20/16 1442 05/21/16 0349  WBC 8.5  --  6.1  --  5.7  HGB 6.9*  < >  7.3* 7.3* 7.0*  HCT 21.4*  < > 23.2* 23.0* 22.3*  PLT 104*  --  103*  --  98*  < > = values in this interval not displayed.  Coag's  Recent Labs Lab 05/20/16 0313 05/20/16 1442 05/21/16 0349  APTT 42* 44* 45*  INR 1.80* 1.81* 1.89*    Sepsis Markers  Recent Labs Lab 05/17/16 0147  05/17/16 1005 05/17/16 1536 05/17/16 2205 05/18/16 0247 05/19/16 0220  LATICACIDVEN 1.7  < > 1.5 1.3 1.7  --   --   PROCALCITON <0.10  --   --   --   --  0.10 <0.10  < > = values in this interval not displayed.  ABG  Recent Labs Lab 05/16/16 2018 05/17/16 0350  PHART 7.35 7.489*  PCO2ART 30* 20.3*  PO2ART 113* 254*    Liver Enzymes  Recent Labs Lab 05/16/16 2000  05/19/16 0220 05/20/16 0313 05/21/16 0349  AST 61*  --  37  --   --   ALT 33  --  24  --   --   ALKPHOS 61  --  41  --   --   BILITOT 4.4*  --  6.1*  --   --   ALBUMIN 1.9*  < > 1.7*  1.7* 1.9* 1.8*  < > = values in this interval  not displayed.  Cardiac Enzymes  Recent Labs Lab 05/17/16 0147  TROPONINI <0.03    Glucose  Recent Labs Lab 05/17/16 0133 05/17/16 2310 05/18/16 1152 05/20/16 1957  GLUCAP 104* 86 82 107*    Imaging No results found.   STUDIES:  CXR 05/25: opacity in left base. CT head 05/25: possible subacute infarction with associated edema. Neoplastic lesion can not be excluded. No hemorrhage. Port CXR 5/26:  Mild left basilar opacity. ETT in good position. OGT in distal esophagus. MRI brain 05/27: 2.5x1.6x2.4 parasagittal right frontoparietal lesion possible subacute hematoma or subacute infarction w/ hematoma. Less likely cerebral abscess or mass.Increased diffusion abnormality right temporal lobe/hippocampus possibly due to seizure and/or subacute infarct. Atrophy w/ small vessel disease. Port CXR 5/27:  ETT in good position. OGT in distal esophagus.  MRA/MRV 5/27:  No high grade stenosis neck. Moderate tandem stenosis posterior cerebral arteries. Patent dural venous sinuses.     MICROBIOLOGY: Blood Ctx 5/25 >> Urine Ctx 5/25:  E coli Trach Asp Ctx 5/26 >> MRSA PCR 5/26:  Negative   Urine Ctx 5/27:  E coli  ANTIBIOTICS: Levaquin 5/27 >>>5/30 ceftriaxone 5/30>>>7/2  SIGNIFICANT EVENTS: 05/26 > admitted with seizures, required intubation for airway protection. 5/30- bloody BM  LINES/TUBES: OETT 8.0 05/25 > Foley 5/25 > OGT 5/25 > PIV x3  ASSESSMENT / PLAN:  NEUROLOGIC A:  Acute Encephalopathy - Likely secondary to seizure. Ammonia 67 5/29. 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 ? Subacute infarction vs neoplasm.  P:  Neurology following Goal RASS:  0  Continue Keppra. Continue outpatient thiamine IV daily FA IV daily PT/OT consult  PULMONARY A: Acute Respiratory Failure - Unable to protect airway. Possible Aspiration   P:  IS as able upright  CARDIOVASCULAR A:  H/O HTN  P:  Tele Cbc stat  RENAL A:  Hyperchloremia Hypomagnesemia  Hematuria - Possible UTI. Resolved.  P:  kvo K , mag supp bmet in am with mag  GASTROINTESTINAL A:  Nutrition. H/O Liver Cirrhosis Gi bleed this am, r/;o gastritis, was on vent P:  NPO Protonix IV to bid STAT ffp  HEMATOLOGIC A:  Anemia - No signs of active bleeding & Hgb stable. S/P 1u PRBC 5/26. S/P 1u PRBC 5/27. S/P 1u PRBC 5/28. Coagulopathy - Likely due to cirrhosis. 2u FFP 5/27. 2u FFP 5/28. Improved. Thrombocytopenia - Mild. VTE Prophylaxis.  P:  STAT cbc Type blood done Stat FFP SCDs No vit k with neuro issues, CVA  INFECTIOUS A:  Possible Aspiration  E coli UTI - Sensitive to Cipro.  P:  Empiric Levaquin Day - dc , change to ceftriaxone  ENDOCRINE A:  No acute issues.  P:  Monitor glucose on BMP.  Family updated: Son updated by via phone by nurse 5/29.  Interdisciplinary Family Meeting v Palliative Care Meeting:  Due by: 06/01. To triad sdu  Mcarthur RossettiDaniel J. Tyson AliasFeinstein, MD, FACP Pgr: [phone] [facility]

## 2016-05-21 NOTE — Care Management Note (Addendum)
Case Management Note  Patient Details  Name: Jon Morgan MRN: [id number] Date of Birth: [birth year]  Subjective/Objective:         Pt admitted with seizures           Action/Plan:  CM informed by CSW that pt is actually from SNF.  CSW following pt.  CM will continue to follow for discharge needs  PTA - pt alone and independent per son.  CSW consulted.  CM will continue to monitor for disposition needs   Expected Discharge Date:                  Expected Discharge Plan:  Home w Home Health Services  In-House Referral:     Discharge planning Services  CM Consult  Post Acute Care Choice:    Choice offered to:     DME Arranged:    DME Agency:     HH Arranged:    HH Agency:     Status of Service:  In process, will continue to follow  Medicare Important Message Given:    Date Medicare IM Given:    Medicare IM give by:    Date Additional Medicare IM Given:    Additional Medicare Important Message give by:     If discussed at Long Length of Stay Meetings, dates discussed:    Additional Comments:  Cherylann ParrClaxton, Jon Limes Morgan, Jon Morgan 05/21/2016, 1:42 PM

## 2016-05-21 NOTE — NC FL2 (Signed)
[place] MEDICAID FL2 LEVEL OF CARE SCREENING TOOL     IDENTIFICATION  Patient Name: Jon Morgan Birthdate: February 05, 1961 Sex: male Admission Date (Current Location): 05/17/2016  [facility] and [place] Number:  Herbalist and Address:  [facility], [place] [address], [place], [place] [address]      Provider Number: [id number]  Attending Physician Name and Address:  Rush Landmark, MD  Relative Name and Phone Number:  Gurshan Settlemire (son) [phone]    Current Level of Care: Hospital Recommended Level of Care: [place] Prior Approval Number:    Date Approved/Denied:   PASRR Number: [id number]  Discharge Plan: SNF    Current Diagnoses: Patient Active Problem List   Diagnosis Date Noted  . Right-sided nontraumatic intracerebral hemorrhage ([place])   . 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 Seizures ([place]) 05/17/2016  . Pressure ulcer 05/17/2016  . Acute respiratory failure with hypoxia ([place])   . Acute encephalopathy   . Thrombocytopenia ([place])   . Essential hypertension     Orientation RESPIRATION BLADDER Height & Weight     Self  Normal Indwelling catheter Weight: 222 lb 7.1 oz (100.9 kg) Height:  6' (182.9 cm)  BEHAVIORAL SYMPTOMS/MOOD NEUROLOGICAL BOWEL NUTRITION STATUS    Convulsions/Seizures Incontinent Diet (Dysphagia 2 (Fine chop);Thin liquid )  AMBULATORY STATUS COMMUNICATION OF NEEDS Skin   Extensive Assist Verbally PU Stage and Appropriate Care     PU Stage 3 Dressing: Daily (PRN)                 Personal Care Assistance Level of Assistance  Bathing, Dressing, Feeding Bathing Assistance: Maximum assistance Feeding assistance: Limited assistance Dressing Assistance: Maximum assistance     Functional Limitations Info  Sight, Hearing, Speech Sight Info: Adequate Hearing Info: Adequate Speech Info: Adequate    SPECIAL CARE FACTORS FREQUENCY  PT (By licensed PT), OT (By licensed  OT)     PT Frequency: Min 3x/week              Contractures Contractures Info: Not present    Additional Factors Info  Code Status, Allergies Code Status Info: FULL Allergies Info: NKA           Current Medications (05/21/2016):  This is the current hospital active medication list Current Facility-Administered Medications  Medication Dose Route Frequency Provider Last Rate Last Dose  . 0.9 %  sodium chloride infusion   Intravenous Continuous Javier Glazier, MD 10 mL/hr at 05/19/16 1148 10 mL at 05/19/16 1148  . 0.9 %  sodium chloride infusion  250 mL Intravenous PRN Rahul P Desai, PA-C      . antiseptic oral rinse solution (CORINZ)  7 mL Mouth Rinse QID Jose Angelo Radford Pax, MD   7 mL at 05/21/16 1140  . cefTRIAXone (ROCEPHIN) 1 g in dextrose 5 % 50 mL IVPB  1 g Intravenous Q24H Raylene Miyamoto, MD   1 g at 05/21/16 1337  . chlorhexidine gluconate (SAGE KIT) (PERIDEX) 0.12 % solution 15 mL  15 mL Mouth Rinse BID Jose Shirl Harris, MD   15 mL at 05/21/16 [phone]  . folic acid injection 1 mg  1 mg Intravenous Daily Rahul P Desai, PA-C   1 mg at 05/21/16 1010  . levETIRAcetam (KEPPRA) 1,000 mg in sodium chloride 0.9 % 100 mL IVPB  1,000 mg Intravenous Q12H Charles Stewart   1,000 mg at 05/21/16 1010  . magnesium sulfate IVPB  2 g 50 mL  2 g Intravenous Once Raylene Miyamoto, MD      . midazolam (VERSED) 50 mg in sodium chloride 0.9 % 50 mL (1 mg/mL) infusion  0-10 mg/hr Intravenous Continuous Rahul P Desai, PA-C   Stopped at 05/18/16 0800  . pantoprazole (PROTONIX) injection 40 mg  40 mg Intravenous Q12H Raylene Miyamoto, MD   40 mg at 05/21/16 1238  . thiamine (B-1) injection 100 mg  100 mg Intravenous Daily Rahul P Desai, PA-C   100 mg at 05/21/16 1010     Discharge Medications: Please see discharge summary for a list of discharge medications.  Relevant Imaging Results:  Relevant Lab Results:   Additional Information SS #[id number]  Judeth Horn,  LCSW

## 2016-05-22 LAB — FIBRINOGEN
Fibrinogen: 213 mg/dL (ref 204–475)
Fibrinogen: 231 mg/dL (ref 204–475)

## 2016-05-22 LAB — CBC WITH DIFFERENTIAL/PLATELET
Basophils Absolute: 0 K/uL (ref 0.0–0.1)
Basophils Relative: 0 %
Eosinophils Absolute: 0.2 K/uL (ref 0.0–0.7)
Eosinophils Relative: 3 %
HCT: 22.6 % — ABNORMAL LOW (ref 39.0–52.0)
Hemoglobin: 7.1 g/dL — ABNORMAL LOW (ref 13.0–17.0)
Lymphocytes Relative: 25 %
Lymphs Abs: 1.3 K/uL (ref 0.7–4.0)
MCH: 27.2 pg (ref 26.0–34.0)
MCHC: 31.4 g/dL (ref 30.0–36.0)
MCV: 86.6 fL (ref 78.0–100.0)
Monocytes Absolute: 0.4 K/uL (ref 0.1–1.0)
Monocytes Relative: 8 %
Neutro Abs: 3.3 K/uL (ref 1.7–7.7)
Neutrophils Relative %: 63 %
Platelets: 83 K/uL — ABNORMAL LOW (ref 150–400)
RBC: 2.61 MIL/uL — ABNORMAL LOW (ref 4.22–5.81)
RDW: 22.5 % — ABNORMAL HIGH (ref 11.5–15.5)
WBC: 5.2 K/uL (ref 4.0–10.5)

## 2016-05-22 LAB — PREPARE FRESH FROZEN PLASMA
UNIT DIVISION: 0
Unit division: 0

## 2016-05-22 LAB — RENAL FUNCTION PANEL
Albumin: 1.8 g/dL — ABNORMAL LOW (ref 3.5–5.0)
Anion gap: 4 — ABNORMAL LOW (ref 5–15)
BUN: 5 mg/dL — ABNORMAL LOW (ref 6–20)
CO2: 21 mmol/L — ABNORMAL LOW (ref 22–32)
Calcium: 8.4 mg/dL — ABNORMAL LOW (ref 8.9–10.3)
Chloride: 113 mmol/L — ABNORMAL HIGH (ref 101–111)
Creatinine, Ser: 0.68 mg/dL (ref 0.61–1.24)
GFR calc Af Amer: >60 mL/min
GFR calc non Af Amer: >60 mL/min
Glucose, Bld: 84 mg/dL (ref 65–99)
Phosphorus: 2.5 mg/dL (ref 2.5–4.6)
Potassium: 3.4 mmol/L — ABNORMAL LOW (ref 3.5–5.1)
Sodium: 138 mmol/L (ref 135–145)

## 2016-05-22 LAB — APTT
APTT: 46 s — AB (ref 24–37)
aPTT: 44 seconds — ABNORMAL HIGH (ref 24–37)

## 2016-05-22 LAB — PROTIME-INR
INR: 1.83 — AB (ref 0.00–1.49)
Prothrombin Time: 21.1 seconds — ABNORMAL HIGH (ref 11.6–15.2)

## 2016-05-22 LAB — MAGNESIUM: Magnesium: 1.6 mg/dL — ABNORMAL LOW (ref 1.7–2.4)

## 2016-05-22 MED ORDER — ENSURE ENLIVE PO LIQD
237.0000 mL | Freq: Three times a day (TID) | ORAL | Status: DC
  Administered 2016-05-22 – 2016-05-25 (×7): 237 mL via ORAL

## 2016-05-22 MED ORDER — MAGNESIUM SULFATE 2 GM/50ML IV SOLN
2.0000 g | Freq: Once | INTRAVENOUS | Status: AC
  Administered 2016-05-22: 2 g via INTRAVENOUS
  Filled 2016-05-22: qty 50

## 2016-05-22 NOTE — Progress Notes (Signed)
Nutrition Follow-up  DOCUMENTATION CODES:   Not applicable  INTERVENTION:    Ensure Enlive PO TID, each supplement provides 350 kcal and 20 grams of protein  NUTRITION DIAGNOSIS:   Inadequate oral intake related to dysphagia as evidenced by meal completion < 50%.  Ongoing  GOAL:   Patient will meet greater than or equal to 90% of their needs  Unmet  MONITOR:   PO intake, Supplement acceptance, Labs, Weight trends, I & O's  ASSESSMENT:   55 y.o. male with PMH of HTN. He experienced a seizure at home on 05/25 and was subsequently transported to [facility] ED.  Patient was extubated on 5/29. S/P BSE with SLP on 5/29. Diet advanced to dysphagia 2 with thin liquids. Patient is consuming 10-50% of meals.  Plan d/c back to SNF.  Diet Order:  DIET DYS 2 Room service appropriate?: Yes; Fluid consistency:: Thin  Skin:  Wound (see comment) (Stage III to sacrum; DTI to heel)  Last BM:  5/30  Height:   Ht Readings from Last 1 Encounters:  05/21/16 5\' 11"  (1.803 m)    Weight:   Wt Readings from Last 1 Encounters:  05/22/16 226 lb 6.6 oz (102.7 kg)    Ideal Body Weight:  80.9 kg  BMI:  Body mass index is 31.59 kg/(m^2).  Estimated Nutritional Needs:   Kcal:  2280  Protein:  130-145 gm  Fluid:  2.3 L  EDUCATION NEEDS:   No education needs identified at this time  Joaquin CourtsKimberly Harris, RD, LDN, CNSC Pager [phone] After Hours Pager [phone]

## 2016-05-22 NOTE — Progress Notes (Signed)
PROGRESS NOTE    Jon Morgan  WUJ:[id number] DOB: December 20, 1961 DOA: 05/17/2016 PCP: Derwood Kaplan, MD   Brief Narrative:  on 05/17/2016 by Dr. Delphina Cahill A de Dios Monti Jon Morgan is a 55 y.o. male with PMH of HTN. He experienced a seizure at home on 05/25 and was subsequently transported to [facility] ED. Per EMS, pt was initially awake but later became unresponsive en route and had periods of apnea. In ED, he remained unresponsive and was unable to protect airway; therefore, he was intubated by EDP. He was then transferred to [facility] ICU for further workup and management.   Assessment & Plan   Acute encephalopathy -Likely secondary to seizure versus subacute infarction versus neoplasm -Unknown if this is patient's current baseline -Neurology consulted and appreciated -HIV nonreactive -Ammonia 67 -Continue folic acid and thiamine  Seizure disorder -Neurology consultation appreciated -Continue Keppra -EEG on 05/20/2016 showed abnormal EEG consistent with nonspecific mild to moderate diffuse cerebral dysfunction  ? CVA -MRI showed 2.5 x 1.6 x 2.4 cm hemorrhagic lesion within the left parasagittal right frontoparietal region -Echocardiogram showed EF of 65 and 70% -LDL not calculated -Hemoglobin A1c 5.2 -Currently on SCDs due to anemia and questionable bleeding -PT and OT recommended SNF -Speech therapy recommended dysphagia 2 diet -Neurology appreciated -Total cholesterol 69, per documentation in neurology note, no statin recommended  Acute respiratory failure -Patient did require intubation however has successfully been extubated -Pulmonology was following, ?aspiration  Anemia -Patient has received 3 units PRBC during the hospitalization -FOBT pending -Continue to monitor CBC  Hypercoagulopathy/thrombocytopenia -Secondary to liver cirrhosis -Patient received 2 units of FFP 05/18/2016. On 05/19/2016 -Platelets currently stable  Hypertension -Currently stable currently not any  antihypertensives  Hyperchloremia -Continue to monitor BMP  Hypomagnesemia -Will continue to replace and monitor magnesium  Hematuria/Possible urinary tract infection -Resolved -Urine culture showed Escherichia coli, sensitive ciprofloxacin -Patient was on Levaquin however this was transitioned to ceftriaxone per PCCM  History of liver cirrhosis/alcoholic cirrhosis -LFTs stable  DVT Prophylaxis  SCDs  Code Status: Full  Family Communication: None at bedside  Disposition Plan: Admitted. Continue to monitor in stepdown. Will need SNF  Consultants PCCM Neurology  Procedures/Significant Events Intubated on 05/17/2016 EEG 05/20/2016  Antibiotics   Anti-infectives    Start     Dose/Rate Route Frequency Ordered Stop   05/21/16 1200  cefTRIAXone (ROCEPHIN) 1 g in dextrose 5 % 50 mL IVPB     1 g 100 mL/hr over 30 Minutes Intravenous Every 24 hours 05/21/16 1023 05/25/16 1159   05/18/16 0300  levofloxacin (LEVAQUIN) IVPB 750 mg  Status:  Discontinued     750 mg 100 mL/hr over 90 Minutes Intravenous Every 24 hours 05/18/16 0231 05/21/16 1024      Subjective:   Jon Morgan seen and examined today. Patient has no complaints.  States a dog is trying to eat his sock.    Objective:   Filed Vitals:   05/22/16 0354 05/22/16 0446 05/22/16 0711 05/22/16 0728  BP: 131/68  133/68 133/68  Pulse: 80  78 78  Temp:    98.6 F (37 C)  TempSrc:    Oral  Resp: 21  19 22   Height:      Weight:  102.7 kg (226 lb 6.6 oz)    SpO2: 100%  100% 100%    Intake/Output Summary (Last 24 hours) at 05/22/16 1006 Last data filed at 05/22/16 0923  Gross per 24 hour  Intake   1788 ml  Output  1720 ml  Net     68 ml   Filed Weights   05/21/16 0500 05/21/16 1642 05/22/16 0446  Weight: 100.9 kg (222 lb 7.1 oz) 102.8 kg (226 lb 10.1 oz) 102.7 kg (226 lb 6.6 oz)    Exam  General: Well developed, well nourished, NAD  HEENT: NCAT,  mucous membranes moist.   Cardiovascular: S1 S2  auscultated, RRR, no murmurs  Respiratory: Clear to auscultation bilaterally   Abdomen: Soft, nontender, nondistended, + bowel sounds  Extremities: warm dry without cyanosis clubbing or edema  Neuro: AAOx1 (only to self), confused. Left sided weakness (flaccid), can follow some commands  Data Reviewed: I have personally reviewed following labs and imaging studies  CBC:  Recent Labs Lab 05/19/16 0220  05/20/16 0313 05/20/16 1442 05/21/16 0349 05/21/16 1059 05/21/16 1724 05/22/16 0425  WBC 8.5  --  6.1  --  5.7 6.3 5.4 5.2  NEUTROABS 5.1  --  4.1  --  3.7 4.2  --  3.3  HGB 6.9*  < > 7.3* 7.3* 7.0* 7.6* 7.1* 7.1*  HCT 21.4*  < > 23.2* 23.0* 22.3* 24.3* 22.8* 22.6*  MCV 84.9  --  85.3  --  85.4 87.7 86.7 86.6  PLT 104*  --  103*  --  98* 99* 91* 83*  < > = values in this interval not displayed. Basic Metabolic Panel:  Recent Labs Lab 05/18/16 0249 05/19/16 0220 05/20/16 0313 05/20/16 0548 05/21/16 0349 05/22/16 0405  NA 137 140 141  --  141 138  K 4.5 4.1 3.8  --  3.4* 3.4*  CL 119* 118* 119*  --  117* 113*  CO2 14* 16* 18*  --  21* 21*  GLUCOSE 77 89 84  --  84 84  BUN 16 17 12   --  7 5*  CREATININE 1.20 0.99 0.78  --  0.74 0.68  CALCIUM 8.0* 8.6* 8.6*  --  8.6* 8.4*  MG  --  2.0 1.7 1.7 1.6* 1.6*  PHOS 4.5 4.7* 4.0  --  2.8 2.5   GFR: Estimated Creatinine Clearance: 130.3 mL/min (by C-G formula based on Cr of 0.68). Liver Function Tests:  Recent Labs Lab 05/16/16 2000 05/18/16 0249 05/19/16 0220 05/20/16 0313 05/21/16 0349 05/22/16 0405  AST 61*  --  37  --   --   --   ALT 33  --  24  --   --   --   ALKPHOS 61  --  41  --   --   --   BILITOT 4.4*  --  6.1*  --   --   --   PROT 6.8  --  6.1*  --   --   --   ALBUMIN 1.9* 1.5* 1.7*  1.7* 1.9* 1.8* 1.8*   No results for input(s): LIPASE, AMYLASE in the last 168 hours.  Recent Labs Lab 05/18/16 1352 05/19/16 0220 05/20/16 0313  AMMONIA 57* 46* 67*   Coagulation Profile:  Recent Labs Lab  05/20/16 0313 05/20/16 1442 05/21/16 0349 05/21/16 1724 05/22/16 0425  INR 1.80* 1.81* 1.89* 1.83* 1.83*   Cardiac Enzymes:  Recent Labs Lab 05/17/16 0147  TROPONINI <0.03   BNP (last 3 results) No results for input(s): PROBNP in the last 8760 hours. HbA1C: No results for input(s): HGBA1C in the last 72 hours. CBG:  Recent Labs Lab 05/17/16 0133 05/17/16 2310 05/18/16 1152 05/20/16 1957  GLUCAP 104* 86 82 107*   Lipid Profile: No results for input(s): CHOL,  HDL, LDLCALC, TRIG, CHOLHDL, LDLDIRECT in the last 72 hours. Thyroid Function Tests: No results for input(s): TSH, T4TOTAL, FREET4, T3FREE, THYROIDAB in the last 72 hours. Anemia Panel: No results for input(s): VITAMINB12, FOLATE, FERRITIN, TIBC, IRON, RETICCTPCT in the last 72 hours. Urine analysis:    Component Value Date/Time   COLORURINE AMBER* 05/16/2016 2000   APPEARANCEUR CLOUDY* 05/16/2016 2000   LABSPEC 1.011 05/16/2016 2000   PHURINE 7.0 05/16/2016 2000   GLUCOSEU NEGATIVE 05/16/2016 2000   HGBUR 1+* 05/16/2016 2000   BILIRUBINUR NEGATIVE 05/16/2016 2000   KETONESUR NEGATIVE 05/16/2016 2000   PROTEINUR 30* 05/16/2016 2000   NITRITE NEGATIVE 05/16/2016 2000   LEUKOCYTESUR 3+* 05/16/2016 2000   Sepsis Labs: @LABRCNTIP (procalcitonin:4,lacticidven:4)  ) Recent Results (from the past 240 hour(s))  Blood culture (routine x 2)     Status: None   Collection Time: 05/16/16  8:00 PM  Result Value Ref Range Status   Specimen Description BLOOD RIGHT FATTY CASTS  Final   Special Requests   Final    BOTTLES DRAWN AEROBIC AND ANAEROBIC  4CCAERO, 3CCANA   Culture NO GROWTH 5 DAYS  Final   Report Status 05/21/2016 FINAL  Final  Blood culture (routine x 2)     Status: None   Collection Time: 05/16/16  8:00 PM  Result Value Ref Range Status   Specimen Description BLOOD LEFT FATTY CASTS  Final   Special Requests   Final    BOTTLES DRAWN AEROBIC AND ANAEROBIC  9CCAERO, 10CCANA   Culture NO GROWTH 5 DAYS   Final   Report Status 05/21/2016 FINAL  Final  Urine culture     Status: Abnormal   Collection Time: 05/16/16  8:00 PM  Result Value Ref Range Status   Specimen Description URINE, RANDOM  Final   Special Requests NONE  Final   Culture >=100,000 COLONIES/mL ESCHERICHIA COLI (A)  Final   Report Status 05/19/2016 FINAL  Final   Organism ID, Bacteria ESCHERICHIA COLI (A)  Final      Susceptibility   Escherichia coli - MIC*    AMPICILLIN <=2 SENSITIVE Sensitive     CEFAZOLIN <=4 SENSITIVE Sensitive     CEFTRIAXONE <=1 SENSITIVE Sensitive     CIPROFLOXACIN <=0.25 SENSITIVE Sensitive     GENTAMICIN <=1 SENSITIVE Sensitive     IMIPENEM <=0.25 SENSITIVE Sensitive     NITROFURANTOIN <=16 SENSITIVE Sensitive     TRIMETH/SULFA <=20 SENSITIVE Sensitive     AMPICILLIN/SULBACTAM <=2 SENSITIVE Sensitive     PIP/TAZO <=4 SENSITIVE Sensitive     Extended ESBL NEGATIVE Sensitive     * >=100,000 COLONIES/mL ESCHERICHIA COLI  MRSA PCR Screening     Status: None   Collection Time: 05/17/16  1:09 AM  Result Value Ref Range Status   MRSA by PCR NEGATIVE NEGATIVE Final    Comment:        The GeneXpert MRSA Assay (FDA approved for NASAL specimens only), is one component of a comprehensive MRSA colonization surveillance program. It is not intended to diagnose MRSA infection nor to guide or monitor treatment for MRSA infections.   Culture, respiratory (NON-Expectorated)     Status: None   Collection Time: 05/17/16  9:25 AM  Result Value Ref Range Status   Specimen Description TRACHEAL ASPIRATE  Final   Special Requests NONE  Final   Gram Stain   Final    MODERATE WBC PRESENT, PREDOMINANTLY PMN RARE SQUAMOUS EPITHELIAL CELLS PRESENT MODERATE GRAM POSITIVE COCCI IN PAIRS RARE GRAM NEGATIVE COCCOBACILLI  Culture Consistent with normal respiratory flora.  Final   Report Status 05/19/2016 FINAL  Final  Culture, Urine     Status: Abnormal   Collection Time: 05/18/16  3:33 AM  Result Value Ref  Range Status   Specimen Description URINE, CATHETERIZED  Final   Special Requests Normal  Final   Culture >=100,000 COLONIES/mL ESCHERICHIA COLI (A)  Final   Report Status 05/20/2016 FINAL  Final   Organism ID, Bacteria ESCHERICHIA COLI (A)  Final      Susceptibility   Escherichia coli - MIC*    AMPICILLIN <=2 SENSITIVE Sensitive     CEFAZOLIN <=4 SENSITIVE Sensitive     CEFTRIAXONE <=1 SENSITIVE Sensitive     CIPROFLOXACIN <=0.25 SENSITIVE Sensitive     GENTAMICIN <=1 SENSITIVE Sensitive     IMIPENEM <=0.25 SENSITIVE Sensitive     NITROFURANTOIN <=16 SENSITIVE Sensitive     TRIMETH/SULFA <=20 SENSITIVE Sensitive     AMPICILLIN/SULBACTAM <=2 SENSITIVE Sensitive     PIP/TAZO <=4 SENSITIVE Sensitive     * >=100,000 COLONIES/mL ESCHERICHIA COLI      Radiology Studies: Dg Abd 1 View  05/21/2016  CLINICAL DATA:  GI bleeding. EXAM: ABDOMEN - 1 VIEW COMPARISON:  None. FINDINGS: No evidence of dilated bowel loops with colonic gas seen to the level the rectum. Multiple pelvic phleboliths noted. No other significant abnormality identified. IMPRESSION: Unremarkable bowel gas pattern.  No acute findings. Electronically Signed   By: Myles Rosenthal M.D.   On: 05/21/2016 13:00     Scheduled Meds: . cefTRIAXone (ROCEPHIN)  IV  1 g Intravenous Q24H  . folic acid  1 mg Intravenous Daily  . levETIRAcetam  1,000 mg Intravenous Q12H  . pantoprazole (PROTONIX) IV  40 mg Intravenous Q12H  . thiamine  100 mg Intravenous Daily   Continuous Infusions: . sodium chloride 10 mL (05/19/16 1148)  . midazolam (VERSED) infusion Stopped (05/18/16 0800)     LOS: 5 days   Time Spent in minutes   45 minutes  Casy Tavano D.O. on 05/22/2016 at 10:06 AM  Between 7am to 7pm - Pager - [phone]  After 7pm go to [email or web address] - password [email or web address]  And look for the night coverage person covering for me after hours  [facility] Office  [phone]

## 2016-05-22 NOTE — Progress Notes (Signed)
Speech Language Pathology Treatment: Dysphagia  Patient Details Name: Jon Morgan MRN: [id number] DOB: [birth year] Today's Date: 05/22/2016 Time: [id number] SLP Time Calculation (min) (ACUTE ONLY): 20 min  Assessment / Plan / Recommendation Clinical Impression  Pt has frequent coughing throughout intake of thin liquids despite straw removal and Mod cues for appropriate bolus size. Coughing persists after intake has stopped, but was not observed prior to initiation. RN and NT do not report any overt signs of difficulty with prior meals, but given the amount of coughing observed after sips of thin liquids this afternoon recommend to downgrade to nectar thick liquids. Will f/u to determine readiness to advance back to thin liquids.   HPI HPI: 55 year old male admitted after a seizure. Pt became unresponsive and required intubation . PMH significant for HTN. No report of swallowing difficulty. BSE ordered to evaluate swallow function and safety s/p extubation      SLP Plan  Continue with current plan of care     Recommendations  Diet recommendations: Dysphagia 2 (fine chop);Nectar-thick liquid Liquids provided via: Cup Medication Administration: Whole meds with puree Supervision: Patient able to self feed;Full supervision/cueing for compensatory strategies Compensations: Minimize environmental distractions;Slow rate;Small sips/bites Postural Changes and/or Swallow Maneuvers: Seated upright 90 degrees;Upright 30-60 min after meal             Oral Care Recommendations: Oral care BID Follow up Recommendations: Skilled Nursing facility Plan: Continue with current plan of care     GO               Jon Morgan, M.A. CCC-SLP [phone]  Jon Hamaiewonsky, Jon Morgan 05/22/2016, 2:58 PM

## 2016-05-22 NOTE — Progress Notes (Signed)
STROKE TEAM PROGRESS NOTE   SUBJECTIVE (INTERVAL HISTORY) Neurologically stable. Normal documented seizures. Awake and interactive but confused. Dense left hemiplegia persists.   OBJECTIVE Temp:  [98.2 F (36.8 C)-99.3 F (37.4 C)] 98.6 F (37 C) (05/31 0728) Pulse Rate:  [78-92] 78 (05/31 0728) Cardiac Rhythm:  [-] Normal sinus rhythm (05/31 0728) Resp:  [13-27] 22 (05/31 0728) BP: (120-143)/(65-81) 133/68 mmHg (05/31 0728) SpO2:  [97 %-100 %] 100 % (05/31 0728) Weight:  [102.7 kg (226 lb 6.6 oz)-102.8 kg (226 lb 10.1 oz)] 102.7 kg (226 lb 6.6 oz) (05/31 0446)  CBC:   Recent Labs Lab 05/21/16 1059 05/21/16 1724 05/22/16 0425  WBC 6.3 5.4 5.2  NEUTROABS 4.2  --  3.3  HGB 7.6* 7.1* 7.1*  HCT 24.3* 22.8* 22.6*  MCV 87.7 86.7 86.6  PLT 99* 91* 83*    Basic Metabolic Panel:   Recent Labs Lab 05/21/16 0349 05/22/16 0405  NA 141 138  K 3.4* 3.4*  CL 117* 113*  CO2 21* 21*  GLUCOSE 84 84  BUN 7 5*  CREATININE 0.74 0.68  CALCIUM 8.6* 8.4*  MG 1.6* 1.6*  PHOS 2.8 2.5    Lipid Panel:     Component Value Date/Time   CHOL 59 05/18/2016 0249   TRIG 48 05/18/2016 0249   HDL <10* 05/18/2016 0249   CHOLHDL NOT CALCULATED 05/18/2016 0249   VLDL 10 05/18/2016 0249   LDLCALC NOT CALCULATED 05/18/2016 0249   HgbA1c:  Lab Results  Component Value Date   HGBA1C 5.2 05/18/2016   Urine Drug Screen:     Component Value Date/Time   LABOPIA NONE DETECTED 05/17/2016 0232   COCAINSCRNUR NONE DETECTED 05/17/2016 0232   LABBENZ POSITIVE* 05/17/2016 0232   AMPHETMU NONE DETECTED 05/17/2016 0232   THCU NONE DETECTED 05/17/2016 0232   LABBARB NONE DETECTED 05/17/2016 0232     IMAGING No results found.    PHYSICAL EXAM HEENT- Normocephalic, no lesions, without obvious abnormality. Normal external eye and conjunctiva. Normal TM's bilaterally. Normal auditory canals and external ears. Normal external nose, mucus membranes and septum. Neck supple with no masses,  nodes, nodules or enlargement. Cardiovascular - regular rate and rhythm, S1, S2 normal, no murmur, click, rub or gallop Lungs - chest clear, no wheezing, rales, normal symmetric air entry Abdomen - soft, non-tender; bowel sounds normal; no masses, no organomegaly Extremities - no joint deformities, effusion, or inflammation and no edema  Neurologic Examination: Patient Sitting in bedside chair. Awake alert and interactive and follows commands consistently.. Disoriented to time. Oriented to place and person. No aphasia, apraxia dysarthria Pupils were very small and equal and reacted to light was indeterminate.  Extraocular movements are limited with oculocephalic maneuvers.  Face was symmetrical with no focal weakness.  Strong corneals and gag  Muscle tone wnl on right and flaccid on left.  There were no spontaneous movements and no abnormal posturing.  Right UE: 4/5  Right LE: 2/5; left side is plegic 0/5  Coordination and gait:  Not able to test   ASSESSMENT/PLAN Jon Morgan is a 55 y.o. male with history of hypertension presenting with a witnessed generalized seizure and respiratory distress requiring intubation and ventilation. He did not receive IV t-PA due to an elevated INR.  Stroke:  Non-dominant hemorrhagic lesion of uncertain etiology -Hemorrhagic infarct versus primary hematoma  Resultant  Loss of consciousness  MRI - 2.5 x 1.6 x 2.4 cm hemorrhagic lesion within the parasagittal right frontoparietal region  MRA head and neck  and MRV - documented above  Carotid Doppler - refer to MRA of the neck  2D Echo -EF 65-70%,   LDL - Not calculated  Ammonia - 57 (H)  HIV - nonreactive   fibrinogen - 199 (L)  HgbA1c 5.2  VTE prophylaxis - SCDs DIET DYS 2 Room service appropriate?: Yes; Fluid consistency:: Thin  No antithrombotic prior to admission, now on No antithrombotic secondary to hemorrhage  Ongoing aggressive stroke risk factor management  Therapy  recommendations: SNF  Disposition:  SNF  Seizures  EEG - Abnormalities: - generalized irregular slow activity  LT EEG - Diffuse background slowing consistent with improvement over the duration, due to decrease in sedating medications. No seizures.  Continue Keppra 1000 q 12h  discontinue long term EEG recording  Hypertension  Stable  Hyperlipidemia  Home meds: No lipid lowering medications prior to admission  LDL not be calculated, goal < 70. Chol 59  No statin indicated  Other Stroke Risk Factors  Obesity, Body mass index is 31.59 kg/(m^2).   Hx ETOH abuse  Hx stroke April/May 2017 per son  Other Problems  Respiratory failure, was intubated, now extubated  Anemia s/p transfuson x 3 this admission  Cardiomyopathy d/t cirrhosis, s/p 2u FFP  Elevated INR - daily - 1.89  Mildly elevated liver function tests  Hx of alcoholic cirrhosis  Thrombocytopenia  Tachycardia  Urine culture -greater than 100,000 [facility] day # 5  I have personally examined this patient, reviewed notes, independently viewed imaging studies, participated in medical decision making and plan of care. I have made any additions or clarifications directly to the above note. Agree with note above. Continue Keppra for seizures.Transfer to SNF for rehabilitation when medically stable. Stroke team will sign off. Kindly call for questions.  Delia Heady, MD Medical Director [facility] Pager: [phone] 05/22/2016 12:55 PM   To contact Stroke Continuity provider, please refer to [email or web address]. After hours, contact General Neurology

## 2016-05-23 DIAGNOSIS — K3189 Other diseases of stomach and duodenum: Secondary | ICD-10-CM

## 2016-05-23 DIAGNOSIS — D509 Iron deficiency anemia, unspecified: Secondary | ICD-10-CM

## 2016-05-23 DIAGNOSIS — K7460 Unspecified cirrhosis of liver: Secondary | ICD-10-CM

## 2016-05-23 DIAGNOSIS — I619 Nontraumatic intracerebral hemorrhage, unspecified: Secondary | ICD-10-CM

## 2016-05-23 LAB — MAGNESIUM: MAGNESIUM: 1.7 mg/dL (ref 1.7–2.4)

## 2016-05-23 LAB — COMPREHENSIVE METABOLIC PANEL
ALBUMIN: 1.8 g/dL — AB (ref 3.5–5.0)
ALK PHOS: 51 U/L (ref 38–126)
ALT: 25 U/L (ref 17–63)
ANION GAP: 6 (ref 5–15)
AST: 54 U/L — ABNORMAL HIGH (ref 15–41)
BUN: 5 mg/dL — ABNORMAL LOW (ref 6–20)
CALCIUM: 8 mg/dL — AB (ref 8.9–10.3)
CO2: 18 mmol/L — AB (ref 22–32)
CREATININE: 0.62 mg/dL (ref 0.61–1.24)
Chloride: 112 mmol/L — ABNORMAL HIGH (ref 101–111)
Glucose, Bld: 75 mg/dL (ref 65–99)
Potassium: 3.6 mmol/L (ref 3.5–5.1)
Sodium: 136 mmol/L (ref 135–145)
Total Bilirubin: 5.3 mg/dL — ABNORMAL HIGH (ref 0.3–1.2)
Total Protein: 6.5 g/dL (ref 6.5–8.1)

## 2016-05-23 LAB — CBC WITH DIFFERENTIAL/PLATELET
BASOS ABS: 0 10*3/uL (ref 0.0–0.1)
Basophils Relative: 0 %
EOS ABS: 0.2 10*3/uL (ref 0.0–0.7)
Eosinophils Relative: 4 %
HCT: 23.1 % — ABNORMAL LOW (ref 39.0–52.0)
Hemoglobin: 7.2 g/dL — ABNORMAL LOW (ref 13.0–17.0)
LYMPHS ABS: 1.2 10*3/uL (ref 0.7–4.0)
Lymphocytes Relative: 25 %
MCH: 27.6 pg (ref 26.0–34.0)
MCHC: 31.2 g/dL (ref 30.0–36.0)
MCV: 88.5 fL (ref 78.0–100.0)
MONO ABS: 0.5 10*3/uL (ref 0.1–1.0)
Monocytes Relative: 10 %
NEUTROS PCT: 61 %
Neutro Abs: 3 10*3/uL (ref 1.7–7.7)
PLATELETS: 85 10*3/uL — AB (ref 150–400)
RBC: 2.61 MIL/uL — AB (ref 4.22–5.81)
RDW: 22.5 % — AB (ref 11.5–15.5)
WBC: 4.9 10*3/uL (ref 4.0–10.5)

## 2016-05-23 LAB — APTT
APTT: 48 s — AB (ref 24–37)
aPTT: 46 seconds — ABNORMAL HIGH (ref 24–37)

## 2016-05-23 LAB — FIBRINOGEN
FIBRINOGEN: 190 mg/dL — AB (ref 204–475)
FIBRINOGEN: 227 mg/dL (ref 204–475)

## 2016-05-23 LAB — PHOSPHORUS: PHOSPHORUS: 3 mg/dL (ref 2.5–4.6)

## 2016-05-23 LAB — PROTIME-INR
INR: 1.94 — ABNORMAL HIGH (ref 0.00–1.49)
PROTHROMBIN TIME: 22.1 s — AB (ref 11.6–15.2)

## 2016-05-23 LAB — AMMONIA: Ammonia: 98 umol/L — ABNORMAL HIGH (ref 9–35)

## 2016-05-23 MED ORDER — LACTULOSE 10 GM/15ML PO SOLN
20.0000 g | Freq: Two times a day (BID) | ORAL | Status: DC
  Administered 2016-05-23 – 2016-05-25 (×4): 20 g via ORAL
  Filled 2016-05-23 (×4): qty 30

## 2016-05-23 MED ORDER — PANTOPRAZOLE SODIUM 40 MG PO TBEC
40.0000 mg | DELAYED_RELEASE_TABLET | Freq: Two times a day (BID) | ORAL | Status: DC
  Administered 2016-05-23: 40 mg via ORAL
  Filled 2016-05-23: qty 1

## 2016-05-23 MED ORDER — PANTOPRAZOLE SODIUM 40 MG PO TBEC
40.0000 mg | DELAYED_RELEASE_TABLET | Freq: Every day | ORAL | Status: DC
  Administered 2016-05-24 – 2016-05-25 (×2): 40 mg via ORAL
  Filled 2016-05-23 (×2): qty 1

## 2016-05-23 MED ORDER — FERROUS SULFATE 325 (65 FE) MG PO TABS
325.0000 mg | ORAL_TABLET | Freq: Every day | ORAL | Status: DC
  Administered 2016-05-24 – 2016-05-25 (×2): 325 mg via ORAL
  Filled 2016-05-23 (×2): qty 1

## 2016-05-23 MED ORDER — MAGNESIUM SULFATE 2 GM/50ML IV SOLN
2.0000 g | Freq: Once | INTRAVENOUS | Status: AC
  Administered 2016-05-23: 2 g via INTRAVENOUS
  Filled 2016-05-23: qty 50

## 2016-05-23 MED ORDER — LACTULOSE 10 GM/15ML PO SOLN
20.0000 g | Freq: Once | ORAL | Status: AC
  Administered 2016-05-23: 20 g via ORAL
  Filled 2016-05-23: qty 30

## 2016-05-23 MED ORDER — NADOLOL 20 MG PO TABS
20.0000 mg | ORAL_TABLET | Freq: Every day | ORAL | Status: DC
  Administered 2016-05-23 – 2016-05-25 (×3): 20 mg via ORAL
  Filled 2016-05-23 (×3): qty 1

## 2016-05-23 MED ORDER — VITAMIN C 500 MG PO TABS
250.0000 mg | ORAL_TABLET | Freq: Two times a day (BID) | ORAL | Status: DC
  Administered 2016-05-23 – 2016-05-25 (×5): 250 mg via ORAL
  Filled 2016-05-23 (×5): qty 1

## 2016-05-23 MED ORDER — VITAMIN K1 10 MG/ML IJ SOLN
10.0000 mg | Freq: Once | INTRAMUSCULAR | Status: AC
  Administered 2016-05-23: 10 mg via SUBCUTANEOUS
  Filled 2016-05-23: qty 1

## 2016-05-23 NOTE — Consult Note (Addendum)
[facility] Consult: 12:34 PM 05/23/2016  LOS: 6 days    Referring Provider: Dr Catha Gosselin  Primary Care Physician:  Derwood Kaplan, MD Primary Gastroenterologist:  [facility]     Reason for Consultation:  Anemia,  Reported previous bloody stool   HPI: Jon Morgan is a 55 y.o. male.  Residence with wife in [place], [place] but recently at [facility] in [facility].  Hx htn, ETOH abuse.  Hepatic encephalopathy. Cirrhosis of liver and Hep C diagnosed in 03/2014.  Thrombocytopenia. Anemia dx 03/2014: started on po iron by [facility] MDs. Pt has not followed up with [facility] and Hep C has never been treated. Continues to drink. Coagulopathy. He has had extensive evaluation and treatment by [facility] GI/Hepatology as summarized below.   04/01/14 Ultrasound abdomen: -- Nodular, echogenic liver consistent with cirrhosis.  -- Mildly thickened gallbladder wall with pericholecystic fluid, likely related to liver disease. No cholelithiasis or positive Murphy's sign to suggest acute cholecystitis. 04/02/14 MRI Abd with contrast: 1. Findings suggestive of cirrhosis complicated by splenomegaly, portal venous hypertension, and gastric varices. Mild-moderate fatty liver. No lesions are seen to suggest HCC. No evidence of splenic vein thrombosis as clinically questioned. 2. Nonspecific gallbladder wall thickening, likely secondary to patient's diagnosis of cirrhosis. No evidence of cholelithiasis. 3. Hypovascular 1.2 cm nodule in the dome of the liver, likely a regenerative nodule. Recommend 6 month follow-up. 03/2014 EGD: isolated type 1 gastric varix, no bleeding stigmata.  03/2014 Colonoscopy:  - Diverticulosis in the sigmoid colon, in the descending  colon and in the transverse colon. - Small lipoma in the descending colon.  Biopsied. - The hepatic flexure, ascending colon, cecum, appendiceal  orifice and ileocecal valve are normal. - Internal hemorrhoids. - No blood noted on this exam.  04/17/2015 EGD:  - Normal esophagus. - Portal hypertensive gastropathy. - Type 1 isolated gastric varices (IGV1, varices located  in the fundus), without bleeding. - Normal examined duodenum.  Hx hematemesis 04/2015:  05/12/15 EGD: Isolated Type 1 gastric varix .   05/13/2015 US GUIDED VASCULAR EVALUATION AND ACCESS/BRTO Large gastrorenal varix embolized using a 14 mm Amplatzer vascular plug II and gelfoam 05/26/2015 EGD:   - Non-bleeding grade I esophageal varices. - Portal hypertensive gastropathy. - Type 1 isolated gastric varix (IGV1, varix located in  the fundus), previously treated and without bleeding.  Unchanged in size. - Normal examined duodenum.   MELD 13.   11/2015 Liver Doppler:  Patent hepatic vasculature. The flow within the main portal vein and right anterior portal vein are bidirectional. The flow within the midline splenic vein is hepatofugal. Left upper quadrant varices are also noted. Splenomegaly. Trace right upper quadrant free fluid. These findings are suggestive of portal hypertension. Cirrhosis without evidence of focal solid liver lesion.  Diffuse circumferential wall thickening of the gallbladder. This is likely secondary to liver disease. Mild pancreatic ductal dilatation. This is also noted on prior MRI from 04/01/2014 and appears stable.  03/2016 admission at [facility] with right frontal intraparenchymal hemorrhage.  Residual left sided weakness.  Seizures noted during that admission, discharged on Keppra.   Treated for asp pna.  Po Iron discontinued due to ileus.  04/14/2016 Abd ultrasound:  -Enlarged, nodular and echogenic liver, consistent with cirrhosis. -Thickened gallbladder wall, probably reactive in the setting of chronic liver disease. -Pancreatic ductal dilatation, unchanged since MRI  performed 04/01/2014. No obstructing lesion identified. -Small ascites -Left pleural effusion.  ++++++++++++++++++++++++++++++++++++++++++++++++++++++++++++++++++++++  Transfer to [facility] for ICU admission after seizures and AMS at home.  Required intubation for periods of apnea, and to protect airway. Since admission has had PRBCs x 3, coagulopathy s/p FFP x 4.  Has  thrombocytopenia, moderate carotid stenosis, protein cal malnutrition, jaundice.  No imaging thus far of abdomen, pelvis.   Despite transfusions, Hgb still hovering ~ 7.1.  Platelets drifting steadily down to 85 from 103.   On 04/19/16 Hgb was 8.4, MCV 89, platelets 92  Home med list includes Omeprazole, no NSAIDs/ASA, no iron, no Keppra (despite discharging from [facility] on this).  Denies abd pain, dysphagia, pyrosis, n/v.  No bloody or black stools.  No abdominal pain.  No swelling of legs, abdomen or scrotum.  Good appetite at home.  SLP has cleared pt for dysphagia 2 diet.  He is coughing with thin liquids.  Last night pt had a brown stool.  Unfortunately it was not sent for FOB test.      Past Medical History  Diagnosis Date  . Hypertension   See HPI  History reviewed. No pertinent past surgical history.  Prior to Admission medications   Medication Sig Start Date End Date Taking? Authorizing Provider  acetaminophen (TYLENOL) 650 MG CR tablet Take 650 mg by mouth every 4 (four) hours as needed for pain. (do not exceed 3 grams in 24 hours)   Yes Historical Provider, MD  amLODipine (NORVASC) 10 MG tablet Take 10 mg by mouth daily.   Yes Historical Provider, MD  fluticasone (FLONASE) 50 MCG/ACT nasal spray Place 1 spray into both nostrils daily.   Yes Historical Provider, MD  Menthol, Topical Analgesic, (BIOFREEZE EX) Apply 4 g topically every 8 (eight) hours as needed (pain). Biofreeze 5%   Yes Historical Provider, MD  Multiple Vitamin (MULTIVITAMIN WITH MINERALS) TABS tablet Take 1 tablet by mouth daily.   Yes Historical Provider,  MD  omeprazole (PRILOSEC) 20 MG capsule Take 20 mg by mouth daily.   Yes Historical Provider, MD  potassium chloride SA (K-DUR,KLOR-CON) 20 MEQ tablet Take 20 mEq by mouth daily.   Yes Historical Provider, MD  thiamine (VITAMIN B-1) 100 MG tablet Take 100 mg by mouth daily.   Yes Historical Provider, MD    Scheduled Meds: . cefTRIAXone (ROCEPHIN)  IV  1 g Intravenous Q24H  . feeding supplement (ENSURE ENLIVE)  237 mL Oral TID BM  . folic acid  1 mg Intravenous Daily  . lactulose  20 g Oral Once  . levETIRAcetam  1,000 mg Intravenous Q12H  . magnesium sulfate 1 - 4 g bolus IVPB  2 g Intravenous Once  . pantoprazole  40 mg Oral BID  . thiamine  100 mg Intravenous Daily   Infusions: . sodium chloride 10 mL (05/19/16 1148)   PRN Meds: sodium chloride   Allergies as of 05/16/2016  . (No Known Allergies)    History reviewed. No pertinent family history.  Social History   Social History  . Marital Status: Unknown    Spouse Name: N/A  . Number of Children: N/A  . Years of Education: N/A   Occupational  History  . Not on file.   Social History Main Topics  . Smoking status: Unknown If Ever Smoked  . Smokeless tobacco: Not on file  . Alcohol Use: Not on file  . Drug Use: No  . Sexual Activity: Not Currently   Other Topics Concern  . Not on file   Social History Narrative    REVIEW OF SYSTEMS: Constitutional:  No weight loss.  Left sided weakness ENT:  No nose bleeds Pulm:  + cough, denies dyspnea.   CV:  No palpitations, no LE edema.  GU:  No hematuria, no frequency GI:  Per HPI Heme:  Per HPI   Transfusions:  Per HPI Neuro:  No headaches, no peripheral tingling or numbness Derm:  No itching, no rash or sores.  Endocrine:  No sweats or chills.  No polyuria or dysuria Immunization:  Not queried.   Travel:  None beyond local counties in last few months.    PHYSICAL EXAM: Vital signs in last 24 hours: Filed Vitals:   05/23/16 0325 05/23/16 0756  BP:  138/74   Pulse:  88  Temp: 98.2 F (36.8 C) 99 F (37.2 C)  Resp:  22   Wt Readings from Last 3 Encounters:  05/23/16 101.9 kg (224 lb 10.4 oz)  05/16/16 93.4 kg (205 lb 14.6 oz)    General: pleasant, chronically ill appearing AAM. Head:  No asymmetry or swelling.    Eyes:  Slight jaundice Ears:  Somewhat HOH  Nose:  No discharge Mouth:  Moist, clear oral MM.  Only a few lower incisors remain Neck:  No mass, no TMG, no JVD Lungs:  Clear.  + cough.  No dyspnea Heart: RRR.  No MRG.  S1/s2 present Abdomen:  Soft, NT, ND.  No masses, no obvious ascites .   Rectal: deferred.     Musc/Skeltl: no joint swelling, slight contracture at left elbow Extremities:  Slight edema in left arm.    Neurologic:  Oriented to self, year.  Thinks he is in [place], not able to recognize location as even being in a hospital.  Follows commands.  No impaired speech.  No tremor.  Weakness on left leg and arm Skin:  No rash or sores Psych:  Pleasant, calm, cooperative.   Intake/Output from previous day: 05/31 0701 - 06/01 0700 In: 965 [P.O.:640; IV Piggyback:325] Out: 665 [Urine:665] Intake/Output this shift: Total I/O In: -  Out: 601 [Urine:600; Stool:1]  LAB RESULTS:  Recent Labs  05/21/16 1724 05/22/16 0425 05/23/16 0524  WBC 5.4 5.2 4.9  HGB 7.1* 7.1* 7.2*  HCT 22.8* 22.6* 23.1*  PLT 91* 83* 85*   BMET Lab Results  Component Value Date   NA 136 05/23/2016   NA 138 05/22/2016   NA 141 05/21/2016   K 3.6 05/23/2016   K 3.4* 05/22/2016   K 3.4* 05/21/2016   CL 112* 05/23/2016   CL 113* 05/22/2016   CL 117* 05/21/2016   CO2 18* 05/23/2016   CO2 21* 05/22/2016   CO2 21* 05/21/2016   GLUCOSE 75 05/23/2016   GLUCOSE 84 05/22/2016   GLUCOSE 84 05/21/2016   BUN 5* 05/23/2016   BUN 5* 05/22/2016   BUN 7 05/21/2016   CREATININE 0.62 05/23/2016   CREATININE 0.68 05/22/2016   CREATININE 0.74 05/21/2016   CALCIUM 8.0* 05/23/2016   CALCIUM 8.4* 05/22/2016   CALCIUM 8.6* 05/21/2016    LFT  Recent Labs  05/21/16 0349 05/22/16 0405 05/23/16 0524  PROT  --   --  6.5  ALBUMIN 1.8* 1.8* 1.8*  AST  --   --  54*  ALT  --   --  25  ALKPHOS  --   --  51  BILITOT  --   --  5.3*   PT/INR Lab Results  Component Value Date   INR 1.94* 05/23/2016   INR 1.83* 05/22/2016   INR 1.83* 05/21/2016    Drugs of Abuse     Component Value Date/Time   LABOPIA NONE DETECTED 05/17/2016 0232   COCAINSCRNUR NONE DETECTED 05/17/2016 0232   LABBENZ POSITIVE* 05/17/2016 0232   AMPHETMU NONE DETECTED 05/17/2016 0232   THCU NONE DETECTED 05/17/2016 0232   LABBARB NONE DETECTED 05/17/2016 0232     RADIOLOGY STUDIES: No results found.  ENDOSCOPIC STUDIES: Per HPI  IMPRESSION:   *  Chronic anemia.   On po iron in past.  Not clear he took this recently but it was d/c'd due to ileus in 03/2016.      *  Reported hx of bloody stool at initial arrival.  Resolved. 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   Hx UGIB.  52016.  S/p obliteration of Gastric varix, BRTO.  On follow up EGD 04/2015: grade 1 esophageal varix and prtal gastropathy, but gastric varix unchanged in size.  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 No beta blocker in use PTA. Getting Rocephin starting 5/30 x 4 days.    *  Cirrhosis of liver. Chronic alcoholism and Hep C.  Still drinks and no Hep C treatment due to non-compliance with follow up and ongoing ETOH abuse  *  Hx HE, treated with lactulose in past.  Persistent though improved AMS, confusion.  Dr Catha Gosselin to start lactulose  *  Coagulopathy.  Chronic.   Due to liver disease.  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  Not taking Keppra as RX'd so admitted with recurrent seizure.  Now restarted on Keppra   *  Dysphagia per SLP eval: on D2 diet.   *  Protein cal malnutrition. Albumin 1.8    PLAN:     *  Restart once daily po iron, restart Vitamin C BID as per rec of MDs at [facility].  Start Nadolol 20 mg daily (discussed with and ok with  attending MD).  No plans for EGD unless signs of persistent GI hemorrage.   *  Drop PPI to once daily.    *  GI follow up, when he is ready and able to comply, with GI at [facility] hepatology clinic (though track record is poor as to follow up)    *  ETOH abstinence    Jennye Moccasin  05/23/2016, 12:34 PM Pager: [phone]    Attending physician's note   I have taken a history, examined the patient and reviewed the chart. I agree with the Advanced Practitioner's note, impression and recommendations.  Cirrhosis from etoh and Hep C with history of gastric varix-treated with BRTO, esophageal varices, portal gastropathy, coagulopathy evaluated/treated at [facility]. Chronic anemia with history of iron deficiency evaluated at [facility] with colonoscopy 03/2014 and EGD 05/2015. Bloody stool reported on arrival and brown stool since. Suspected bleeding from portal gastropathy and/or internal hemorrhoids. Coagulopathy, thrombocytopenia secondary to cirrhosis, hypersplenism. Suspected HE. Recommend:  Resume Fe replacement.  Begin Nadolol 20 mg daily for portal hypertension, portal gastropathy, esophageal varices. Start lactulose bid. Trend ammonia. Can add Xifaxan.  R/O low Vit K. Vit K SQ today.  Anusol supp bid prn hemorrhoidal symptoms.  Avoid etoh  use.  No plans to repeat colonoscopy or EGD unless he has ongoing, active bleeding.  [facility]  Claudette HeadMalcolm Caly Pellum, MD Clementeen GrahamFACG [phone] Mon-Fri 8a-5p [phone] after 5p, weekends, holidays

## 2016-05-23 NOTE — Progress Notes (Signed)
Physical Therapy Treatment Patient Details Name: Jon Morgan MRN: [id number] DOB: [birth year] Today's Date: 05/23/2016    History of Present Illness Pt adm with seizure. Pt intubated 5/26-5/29. Pt with encephalopathy. MRI showed a low density medial posterior right frontal lesion ?etiology. PMH - HTN and ICH 03/2016.     PT Comments    Pt needs two person assist to get EOB and we did not feel safe standing EOB due to significant difficulty maintaining sitting balance.  He is appropriate to be lifted to chair using the maxi move lift.  PT will continue to follow acutely as pt was participative and eager to sit up.    Follow Up Recommendations  SNF     Equipment Recommendations  Wheelchair (measurements PT);Wheelchair cushion (measurements PT);Hospital bed;Other (comment) (hoyer lift)    Recommendations for Other Services   NA     Precautions / Restrictions Precautions Precautions: Fall Precaution Comments: dense left LE hemi Restrictions Weight Bearing Restrictions: No    Mobility  Bed Mobility Overal bed mobility: Needs Assistance Bed Mobility: Rolling;Supine to Sit;Sit to Supine Rolling: Max assist   Supine to sit: +2 for physical assistance;Max assist Sit to supine: Max assist;+2 for physical assistance   General bed mobility comments: Assit needed at both left leg, arm, and trunk during transitional movements, posterior preference throughout.   Transfers                 General transfer comment: Did not feel safe standing as he needed so much assist in sitting.       Modified Rankin (Stroke Patients Only) Modified Rankin (Stroke Patients Only) Pre-Morbid Rankin Score:  (unknown) Modified Rankin: Severe disability     Balance Overall balance assessment: Needs assistance Sitting-balance support: Single extremity supported;Feet supported Sitting balance-Leahy Scale: Zero Sitting balance - Comments: Total assist to sit EOB.  At times when asked to reach  forward or towards feet to pull up socks pt could come anterior, but would quickly revert back to posterior LOB.  Postural control: Posterior lean     Standing balance comment: Did not occur, not safe to attempt at this time                    Cognition Arousal/Alertness: Awake/alert Behavior During Therapy: WFL for tasks assessed/performed Overall Cognitive Status: No family/caregiver present to determine baseline cognitive functioning Area of Impairment: Attention;Memory;Following commands;Safety/judgement;Problem solving;Awareness Orientation Level: Time Current Attention Level: Sustained Memory: Decreased short-term memory Following Commands: Follows one step commands with increased time;Follows multi-step commands with increased time;Follows one step commands inconsistently;Follows multi-step commands inconsistently Safety/Judgement: Decreased awareness of safety;Decreased awareness of deficits Awareness: Intellectual Problem Solving: Slow processing;Decreased initiation;Difficulty sequencing;Requires verbal cues;Requires tactile cues General Comments: Pt able to state his name, place, and situation. However, pt kept stating he was wanting to catch the bus to go to the store. Therapist had to redirect  and re-orient patient.     Exercises General Exercises - Upper Extremity Shoulder Flexion: PROM;5 reps;Left;Seated (did not flex past 90* shoulder flexion) Shoulder Extension: PROM;5 reps;Seated;Left Elbow Flexion: PROM;Left;5 reps;Seated Elbow Extension: PROM;Left;5 reps;Seated Wrist Flexion: PROM;Left;5 reps;Seated Wrist Extension: PROM;Left;5 reps;Seated Shoulder Exercises Shoulder External Rotation: PROM;Left;5 reps;Seated        Pertinent Vitals/Pain Pain Assessment: Faces Faces Pain Scale: Hurts little more Pain Location: at times with L UE ROM Pain Descriptors / Indicators: Grimacing;Guarding Pain Intervention(s): Limited activity within patient's  tolerance;Monitored during session;Repositioned           PT  Goals (current goals can now be found in the care plan section) Acute Rehab PT Goals Patient Stated Goal: to take the bus out of here Progress towards PT goals: Progressing toward goals    Frequency  Min 3X/week    PT Plan Current plan remains appropriate    Co-evaluation PT/OT/SLP Co-Evaluation/Treatment: Yes Reason for Co-Treatment: For patient/therapist safety PT goals addressed during session: Mobility/safety with mobility;Balance;Strengthening/ROM OT goals addressed during session: ADL's and self-care;Other (comment) (functional mobility)     End of Session   Activity Tolerance: Patient tolerated treatment well Patient left: in bed;with call bell/phone within reach;with bed alarm set     Time: 1610-9604 PT Time Calculation (min) (ACUTE ONLY): 30 min  Charges:  $Therapeutic Activity: 8-22 mins                      Yutaka Holberg B. Bernece Morgan, PT, DPT [phone]   05/23/2016, 2:16 PM

## 2016-05-23 NOTE — Progress Notes (Signed)
PROGRESS NOTE    Javian Nudd  QMV:[id number] DOB: 1961-12-17 DOA: 05/17/2016 PCP: Derwood Kaplan, MD   Brief Narrative:  on 05/17/2016 by Dr. Delphina Cahill A de Dios Wenzel Backlund is a 55 y.o. male with PMH of HTN. He experienced a seizure at home on 05/25 and was subsequently transported to [facility] ED. Per EMS, pt was initially awake but later became unresponsive en route and had periods of apnea. In ED, he remained unresponsive and was unable to protect airway; therefore, he was intubated by EDP. He was then transferred to [facility] ICU for further workup and management.   Assessment & Plan   Acute encephalopathy -Likely secondary to seizure versus subacute infarction versus neoplasm -Unknown if this is patient's current baseline -Neurology consulted and appreciated and ha signed off -HIV nonreactive -Ammonia 67, pending this morning  -Continue folic acid and thiamine  Seizure disorder -Neurology consultation appreciated -Continue Keppra -EEG on 05/20/2016 showed abnormal EEG consistent with nonspecific mild to moderate diffuse cerebral dysfunction  ? CVA -MRI showed 2.5 x 1.6 x 2.4 cm hemorrhagic lesion within the left parasagittal right frontoparietal region -Echocardiogram showed EF of 65 and 70% -LDL not calculated -Hemoglobin A1c 5.2 -Currently on SCDs due to anemia and questionable bleeding -PT and OT recommended SNF -Speech therapy recommended dysphagia 2 diet -Neurology appreciated -Total cholesterol 69, per documentation in neurology note, no statin recommended  Acute respiratory failure -Patient did require intubation however has successfully been extubated -Pulmonology was following, ?aspiration  Anemia -Patient has received 3 units PRBC during the hospitalization -FOBT pending -Continue to monitor CBC -hemoglobin stable 7.2 today -Will speak to GI regarding further intervention  Hypercoagulopathy/thrombocytopenia -Secondary to liver cirrhosis -Patient received 2  units of FFP on 05/18/2016 and 05/19/2016 -Platelets currently stable,85 today  Hypertension -Currently stable currently not any antihypertensives  Hyperchloremia -Continue to monitor BMP  Hypomagnesemia -Will continue to replace and monitor magnesium -Magnesium 1.7 today  Hematuria/Possible urinary tract infection -Resolved -Urine culture showed Escherichia coli, sensitive ciprofloxacin -Patient was on Levaquin however this was transitioned to ceftriaxone per PCCM  History of liver cirrhosis/alcoholic cirrhosis -LFTs stable  DVT Prophylaxis  SCDs  Code Status: Full  Family Communication: None at bedside  Disposition Plan: Admitted. Will transfer to tele floor.  Will need SNF  Consultants PCCM Neurology  Procedures/Significant Events Intubated on 05/17/2016 EEG 05/20/2016  Antibiotics   Anti-infectives    Start     Dose/Rate Route Frequency Ordered Stop   05/21/16 1200  cefTRIAXone (ROCEPHIN) 1 g in dextrose 5 % 50 mL IVPB     1 g 100 mL/hr over 30 Minutes Intravenous Every 24 hours 05/21/16 1023 05/25/16 1159   05/18/16 0300  levofloxacin (LEVAQUIN) IVPB 750 mg  Status:  Discontinued     750 mg 100 mL/hr over 90 Minutes Intravenous Every 24 hours 05/18/16 0231 05/21/16 1024      Subjective:   Elaina Pattee seen and examined today. Patient has no complaints.  He knows he is in the hospital but states he [place] in a tree.   Objective:   Filed Vitals:   05/23/16 0300 05/23/16 0320 05/23/16 0325 05/23/16 0756  BP:  133/67  138/74  Pulse:  78  88  Temp:   98.2 F (36.8 C) 99 F (37.2 C)  TempSrc:   Oral Oral  Resp:  19  22  Height:      Weight: 101.9 kg (224 lb 10.4 oz)     SpO2:  100%  99%  Intake/Output Summary (Last 24 hours) at 05/23/16 1044 Last data filed at 05/22/16 2222  Gross per 24 hour  Intake    490 ml  Output    415 ml  Net     75 ml   Filed Weights   05/21/16 1642 05/22/16 0446 05/23/16 0300  Weight: 102.8 kg (226 lb 10.1 oz) 102.7  kg (226 lb 6.6 oz) 101.9 kg (224 lb 10.4 oz)    Exam  General: Well developed, well nourished, NAD  HEENT: NCAT,  mucous membranes moist. Poor dentition  Cardiovascular: S1 S2 auscultated, RRR, no murmurs  Respiratory: Clear to auscultation bilaterally   Abdomen: Soft, nontender, nondistended, + bowel sounds  Extremities: warm dry without cyanosis clubbing or edema  Neuro: AAOx 3 (self, time, place), however confused- states he lives in a tree. Left sided weakness (flaccid), can follow some commands  Data Reviewed: I have personally reviewed following labs and imaging studies  CBC:  Recent Labs Lab 05/20/16 0313  05/21/16 0349 05/21/16 1059 05/21/16 1724 05/22/16 0425 05/23/16 0524  WBC 6.1  --  5.7 6.3 5.4 5.2 4.9  NEUTROABS 4.1  --  3.7 4.2  --  3.3 3.0  HGB 7.3*  < > 7.0* 7.6* 7.1* 7.1* 7.2*  HCT 23.2*  < > 22.3* 24.3* 22.8* 22.6* 23.1*  MCV 85.3  --  85.4 87.7 86.7 86.6 88.5  PLT 103*  --  98* 99* 91* 83* 85*  < > = values in this interval not displayed. Basic Metabolic Panel:  Recent Labs Lab 05/19/16 0220 05/20/16 0313 05/20/16 0548 05/21/16 0349 05/22/16 0405 05/23/16 0524  NA 140 141  --  141 138 136  K 4.1 3.8  --  3.4* 3.4* 3.6  CL 118* 119*  --  117* 113* 112*  CO2 16* 18*  --  21* 21* 18*  GLUCOSE 89 84  --  84 84 75  BUN 17 12  --  7 5* 5*  CREATININE 0.99 0.78  --  0.74 0.68 0.62  CALCIUM 8.6* 8.6*  --  8.6* 8.4* 8.0*  MG 2.0 1.7 1.7 1.6* 1.6* 1.7  PHOS 4.7* 4.0  --  2.8 2.5 3.0   GFR: Estimated Creatinine Clearance: 129.7 mL/min (by C-G formula based on Cr of 0.62). Liver Function Tests:  Recent Labs Lab 05/16/16 2000  05/19/16 0220 05/20/16 0313 05/21/16 0349 05/22/16 0405 05/23/16 0524  AST 61*  --  37  --   --   --  54*  ALT 33  --  24  --   --   --  25  ALKPHOS 61  --  41  --   --   --  51  BILITOT 4.4*  --  6.1*  --   --   --  5.3*  PROT 6.8  --  6.1*  --   --   --  6.5  ALBUMIN 1.9*  < > 1.7*  1.7* 1.9* 1.8* 1.8* 1.8*    < > = values in this interval not displayed. No results for input(s): LIPASE, AMYLASE in the last 168 hours.  Recent Labs Lab 05/18/16 1352 05/19/16 0220 05/20/16 0313  AMMONIA 57* 46* 67*   Coagulation Profile:  Recent Labs Lab 05/20/16 1442 05/21/16 0349 05/21/16 1724 05/22/16 0425 05/23/16 0524  INR 1.81* 1.89* 1.83* 1.83* 1.94*   Cardiac Enzymes:  Recent Labs Lab 05/17/16 0147  TROPONINI <0.03   BNP (last 3 results) No results for input(s): PROBNP in the last 8760  hours. HbA1C: No results for input(s): HGBA1C in the last 72 hours. CBG:  Recent Labs Lab 05/17/16 0133 05/17/16 2310 05/18/16 1152 05/20/16 1957  GLUCAP 104* 86 82 107*   Lipid Profile: No results for input(s): CHOL, HDL, LDLCALC, TRIG, CHOLHDL, LDLDIRECT in the last 72 hours. Thyroid Function Tests: No results for input(s): TSH, T4TOTAL, FREET4, T3FREE, THYROIDAB in the last 72 hours. Anemia Panel: No results for input(s): VITAMINB12, FOLATE, FERRITIN, TIBC, IRON, RETICCTPCT in the last 72 hours. Urine analysis:    Component Value Date/Time   COLORURINE AMBER* 05/16/2016 2000   APPEARANCEUR CLOUDY* 05/16/2016 2000   LABSPEC 1.011 05/16/2016 2000   PHURINE 7.0 05/16/2016 2000   GLUCOSEU NEGATIVE 05/16/2016 2000   HGBUR 1+* 05/16/2016 2000   BILIRUBINUR NEGATIVE 05/16/2016 2000   KETONESUR NEGATIVE 05/16/2016 2000   PROTEINUR 30* 05/16/2016 2000   NITRITE NEGATIVE 05/16/2016 2000   LEUKOCYTESUR 3+* 05/16/2016 2000   Sepsis Labs: @LABRCNTIP (procalcitonin:4,lacticidven:4)  ) Recent Results (from the past 240 hour(s))  Blood culture (routine x 2)     Status: None   Collection Time: 05/16/16  8:00 PM  Result Value Ref Range Status   Specimen Description BLOOD RIGHT FATTY CASTS  Final   Special Requests   Final    BOTTLES DRAWN AEROBIC AND ANAEROBIC  4CCAERO, 3CCANA   Culture NO GROWTH 5 DAYS  Final   Report Status 05/21/2016 FINAL  Final  Blood culture (routine x 2)     Status:  None   Collection Time: 05/16/16  8:00 PM  Result Value Ref Range Status   Specimen Description BLOOD LEFT FATTY CASTS  Final   Special Requests   Final    BOTTLES DRAWN AEROBIC AND ANAEROBIC  9CCAERO, 10CCANA   Culture NO GROWTH 5 DAYS  Final   Report Status 05/21/2016 FINAL  Final  Urine culture     Status: Abnormal   Collection Time: 05/16/16  8:00 PM  Result Value Ref Range Status   Specimen Description URINE, RANDOM  Final   Special Requests NONE  Final   Culture >=100,000 COLONIES/mL ESCHERICHIA COLI (A)  Final   Report Status 05/19/2016 FINAL  Final   Organism ID, Bacteria ESCHERICHIA COLI (A)  Final      Susceptibility   Escherichia coli - MIC*    AMPICILLIN <=2 SENSITIVE Sensitive     CEFAZOLIN <=4 SENSITIVE Sensitive     CEFTRIAXONE <=1 SENSITIVE Sensitive     CIPROFLOXACIN <=0.25 SENSITIVE Sensitive     GENTAMICIN <=1 SENSITIVE Sensitive     IMIPENEM <=0.25 SENSITIVE Sensitive     NITROFURANTOIN <=16 SENSITIVE Sensitive     TRIMETH/SULFA <=20 SENSITIVE Sensitive     AMPICILLIN/SULBACTAM <=2 SENSITIVE Sensitive     PIP/TAZO <=4 SENSITIVE Sensitive     Extended ESBL NEGATIVE Sensitive     * >=100,000 COLONIES/mL ESCHERICHIA COLI  MRSA PCR Screening     Status: None   Collection Time: 05/17/16  1:09 AM  Result Value Ref Range Status   MRSA by PCR NEGATIVE NEGATIVE Final    Comment:        The GeneXpert MRSA Assay (FDA approved for NASAL specimens only), is one component of a comprehensive MRSA colonization surveillance program. It is not intended to diagnose MRSA infection nor to guide or monitor treatment for MRSA infections.   Culture, respiratory (NON-Expectorated)     Status: None   Collection Time: 05/17/16  9:25 AM  Result Value Ref Range Status   Specimen Description TRACHEAL ASPIRATE  Final   Special Requests NONE  Final   Gram Stain   Final    MODERATE WBC PRESENT, PREDOMINANTLY PMN RARE SQUAMOUS EPITHELIAL CELLS PRESENT MODERATE GRAM POSITIVE  COCCI IN PAIRS RARE GRAM NEGATIVE COCCOBACILLI    Culture Consistent with normal respiratory flora.  Final   Report Status 05/19/2016 FINAL  Final  Culture, Urine     Status: Abnormal   Collection Time: 05/18/16  3:33 AM  Result Value Ref Range Status   Specimen Description URINE, CATHETERIZED  Final   Special Requests Normal  Final   Culture >=100,000 COLONIES/mL ESCHERICHIA COLI (A)  Final   Report Status 05/20/2016 FINAL  Final   Organism ID, Bacteria ESCHERICHIA COLI (A)  Final      Susceptibility   Escherichia coli - MIC*    AMPICILLIN <=2 SENSITIVE Sensitive     CEFAZOLIN <=4 SENSITIVE Sensitive     CEFTRIAXONE <=1 SENSITIVE Sensitive     CIPROFLOXACIN <=0.25 SENSITIVE Sensitive     GENTAMICIN <=1 SENSITIVE Sensitive     IMIPENEM <=0.25 SENSITIVE Sensitive     NITROFURANTOIN <=16 SENSITIVE Sensitive     TRIMETH/SULFA <=20 SENSITIVE Sensitive     AMPICILLIN/SULBACTAM <=2 SENSITIVE Sensitive     PIP/TAZO <=4 SENSITIVE Sensitive     * >=100,000 COLONIES/mL ESCHERICHIA COLI      Radiology Studies: Dg Abd 1 View  05/21/2016  CLINICAL DATA:  GI bleeding. EXAM: ABDOMEN - 1 VIEW COMPARISON:  None. FINDINGS: No evidence of dilated bowel loops with colonic gas seen to the level the rectum. Multiple pelvic phleboliths noted. No other significant abnormality identified. IMPRESSION: Unremarkable bowel gas pattern.  No acute findings. Electronically Signed   By: Myles Rosenthal M.D.   On: 05/21/2016 13:00     Scheduled Meds: . cefTRIAXone (ROCEPHIN)  IV  1 g Intravenous Q24H  . feeding supplement (ENSURE ENLIVE)  237 mL Oral TID BM  . folic acid  1 mg Intravenous Daily  . levETIRAcetam  1,000 mg Intravenous Q12H  . pantoprazole  40 mg Oral BID  . thiamine  100 mg Intravenous Daily   Continuous Infusions: . sodium chloride 10 mL (05/19/16 1148)     LOS: 6 days   Time Spent in minutes   35 minutes  Emerly Prak D.O. on 05/23/2016 at 10:44 AM  Between 7am to 7pm - Pager -  [phone]  After 7pm go to [email or web address] - password [email or web address]  And look for the night coverage person covering for me after hours  [facility] Office  [phone]

## 2016-05-23 NOTE — Progress Notes (Signed)
Speech Language Pathology Treatment: Dysphagia  Patient Details Name: Jon Morgan MRN: [id number] DOB: Apr 10, 1961 Today's Date: 05/23/2016 Time: [id number] SLP Time Calculation (min) (ACUTE ONLY): 9 min  Assessment / Plan / Recommendation Clinical Impression  Pt does not show any overt s/s of aspiration today with thin liquid trials, which included the 3 ounce water test. Coughing noted on previous date might have been attributable to a single aspiration event with prolonged coughing in response and/or not related to swallowing at all. Recommend to advance back to thin liquids. Will continue to follow.   HPI HPI: 55 year old male admitted after a seizure. Pt became unresponsive and required intubation . PMH significant for HTN. No report of swallowing difficulty. BSE ordered to evaluate swallow function and safety s/p extubation      SLP Plan  Continue with current plan of care     Recommendations  Diet recommendations: Dysphagia 2 (fine chop);Thin liquid Liquids provided via: Cup;Straw Medication Administration: Whole meds with puree Supervision: Patient able to self feed;Full supervision/cueing for compensatory strategies Compensations: Minimize environmental distractions;Slow rate;Small sips/bites Postural Changes and/or Swallow Maneuvers: Seated upright 90 degrees;Upright 30-60 min after meal             Oral Care Recommendations: Oral care BID Follow up Recommendations: Skilled Nursing facility Plan: Continue with current plan of care     GO               Jon Morgan, M.A. CCC-SLP [phone]  Jon Hamaiewonsky, Miangel Flom 05/23/2016, 12:08 PM

## 2016-05-23 NOTE — Progress Notes (Signed)
Occupational Therapy Treatment Patient Details Name: Jon Morgan MRN: [id number] DOB: Nov 21, 1961 Today's Date: 05/23/2016    History of present illness Pt adm with seizure. Pt intubated 5/26-5/29. Pt with encephalopathy. MRI showed a low density medial posterior right frontal lesion ?etiology. PMH - HTN   OT comments  Patient making progress towards OT goals, continue plan of care for now. Pt able to tolerate 28 minute OT/PT co-treatment session, most of session spent with patient seated EOB. Patient's vitals WNL during entire session, including BP, HR, 02 sats, and respiratory rate. Pt will continue to benefit from SNF level rehab to reach his highest potential and level of independence.    Follow Up Recommendations  SNF;Supervision/Assistance - 24 hour    Equipment Recommendations  Other (comment) (TBD next venue of care)    Recommendations for Other Services  None at this time   Precautions / Restrictions Precautions Precautions: Fall Restrictions Weight Bearing Restrictions: No    Mobility Bed Mobility Overal bed mobility: Needs Assistance Bed Mobility: Rolling;Supine to Sit;Sit to Supine Rolling: Max assist   Supine to sit: +2 for physical assistance;Max assist Sit to supine: Max assist;+2 for physical assistance   General bed mobility comments: Assist to move left side extremities and elevate trunk into sitting. Assist to lower turnk and bring legs back into bed. Pt with significant posterior lean with trunk.   Transfers General transfer comment: did not occur this session    Balance Overall balance assessment: Needs assistance Sitting-balance support: Single extremity supported;Feet unsupported Sitting balance-Leahy Scale: Zero Sitting balance - Comments: Pt with significant posterior lean, requiring total assist to maintain static sitting EOB Postural control: Posterior lean     Standing balance comment: Did not occur, not safe to attempt at this time   ADL  Overall ADL's : Needs assistance/impaired Eating/Feeding: Set up;Bed level Eating/Feeding Details (indicate cue type and reason): Pt able to bring cup to mouth using RUE   Grooming Details (indicate cue type and reason): Pt refused, stated NT assisted him this am   Upper Body Bathing Details (indicate cue type and reason): Pt refused, stated NT assisted him this am   Lower Body Bathing Details (indicate cue type and reason): Pt refused, stated NT assisted him this am           Toilet Transfer Details (indicate cue type and reason): unable to safely attempt            General ADL Comments: Pt found supine in bed, no complaints of pain. Pt engaged in bed mobility with +2 assist (PT/OT). From here, pt sat EOB for engagement in dynamic balance activities and LUE PROM exercises (which caused pain - pt reported "stiff". Noted sublux in LUE, at least 2 inches - suspect this was present prior to this hospitlization?       Vision Additional Comments: During this session, pt able to correctly state therapists colored clothing. Pt with difficulty with attending to any type of visual assessment. Will continue to assess in functional context.    Perception Perception Inattention/Neglect: Does not attend to left side of body   Praxis Praxis Praxis tested?: Deficits Deficits: Ideomotor;Initiation    Cognition   Behavior During Therapy: WFL for tasks assessed/performed Overall Cognitive Status: No family/caregiver present to determine baseline cognitive functioning Area of Impairment: Orientation;Attention;Memory;Following commands;Safety/judgement;Awareness;Problem solving Orientation Level: Disoriented to;Time Current Attention Level: Sustained Memory: Decreased short-term memory  Following Commands: Follows one step commands inconsistently Safety/Judgement: Decreased awareness of safety;Decreased awareness of deficits  Problem Solving: Slow processing;Decreased initiation;Difficulty  sequencing;Requires verbal cues;Requires tactile cues General Comments: Pt able to state his name, place, and situation. However, pt kept stating he was wanting to catch the bus to go to the store. Therapist had to redirect  and re-orient patient.       Exercises General Exercises - Upper Extremity Shoulder Flexion: PROM;5 reps;Left;Seated (did not flex past 90* shoulder flexion) Shoulder Extension: PROM;5 reps;Seated;Left Elbow Flexion: PROM;Left;5 reps;Seated Elbow Extension: PROM;Left;5 reps;Seated Wrist Flexion: PROM;Left;5 reps;Seated Wrist Extension: PROM;Left;5 reps;Seated Shoulder Exercises Shoulder External Rotation: PROM;Left;5 reps;Seated           Pertinent Vitals/ Pain       Pain Assessment: Faces Faces Pain Scale: Hurts little more Pain Location: LUE during PROM "It feels stiff!" Pain Descriptors / Indicators: Grimacing;Guarding Pain Intervention(s): Monitored during session   Frequency Min 2X/week     Progress Toward Goals  OT Goals(current goals can now befound in the care plan section)  Progress towards OT goals: Progressing toward goals  Acute Rehab OT Goals Patient Stated Goal: Pt didn't state OT Goal Formulation: With patient Time For Goal Achievement: 06/04/16 Potential to Achieve Goals: Good  Plan Discharge plan remains appropriate    Co-evaluation    PT/OT/SLP Co-Evaluation/Treatment: Yes Reason for Co-Treatment: Complexity of the patient's impairments (multi-system involvement);For patient/therapist safety   OT goals addressed during session: ADL's and self-care;Other (comment) (functional mobility)      End of Session   Activity Tolerance Patient tolerated treatment well   Patient Left in bed;with call bell/phone within reach     Time: [id number] OT Time Calculation (min): 28 min  Charges: OT General Charges $OT Visit: 1 Procedure OT Treatments $Therapeutic Activity: 8-22 mins  Edwin CapPatricia Rasheka Denard , MS, OTR/L, CLT  05/23/2016, 1:21  PM

## 2016-05-24 DIAGNOSIS — K7460 Unspecified cirrhosis of liver: Secondary | ICD-10-CM | POA: Insufficient documentation

## 2016-05-24 DIAGNOSIS — L8990 Pressure ulcer of unspecified site, unspecified stage: Secondary | ICD-10-CM

## 2016-05-24 DIAGNOSIS — K921 Melena: Secondary | ICD-10-CM | POA: Insufficient documentation

## 2016-05-24 LAB — RENAL FUNCTION PANEL
ALBUMIN: 1.8 g/dL — AB (ref 3.5–5.0)
Anion gap: 3 — ABNORMAL LOW (ref 5–15)
CALCIUM: 8.1 mg/dL — AB (ref 8.9–10.3)
CO2: 18 mmol/L — ABNORMAL LOW (ref 22–32)
CREATININE: 0.6 mg/dL — AB (ref 0.61–1.24)
Chloride: 112 mmol/L — ABNORMAL HIGH (ref 101–111)
GFR calc Af Amer: >60 mL/min (ref >60)
GFR calc non Af Amer: >60 mL/min (ref >60)
GLUCOSE: 82 mg/dL (ref 65–99)
PHOSPHORUS: 2.7 mg/dL (ref 2.5–4.6)
Potassium: 3.2 mmol/L — ABNORMAL LOW (ref 3.5–5.1)
SODIUM: 133 mmol/L — AB (ref 135–145)

## 2016-05-24 LAB — CBC WITH DIFFERENTIAL/PLATELET
BASOS ABS: 0 10*3/uL (ref 0.0–0.1)
Basophils Relative: 0 %
EOS ABS: 0.2 10*3/uL (ref 0.0–0.7)
Eosinophils Relative: 4 %
HEMATOCRIT: 24.3 % — AB (ref 39.0–52.0)
Hemoglobin: 7.7 g/dL — ABNORMAL LOW (ref 13.0–17.0)
LYMPHS ABS: 1.3 10*3/uL (ref 0.7–4.0)
Lymphocytes Relative: 24 %
MCH: 27.3 pg (ref 26.0–34.0)
MCHC: 31.7 g/dL (ref 30.0–36.0)
MCV: 86.2 fL (ref 78.0–100.0)
MONO ABS: 0.5 10*3/uL (ref 0.1–1.0)
Monocytes Relative: 9 %
NEUTROS ABS: 3.4 10*3/uL (ref 1.7–7.7)
Neutrophils Relative %: 63 %
Platelets: 96 10*3/uL — ABNORMAL LOW (ref 150–400)
RBC: 2.82 MIL/uL — ABNORMAL LOW (ref 4.22–5.81)
RDW: 22.3 % — AB (ref 11.5–15.5)
WBC: 5.4 10*3/uL (ref 4.0–10.5)

## 2016-05-24 LAB — FIBRINOGEN
FIBRINOGEN: 206 mg/dL (ref 204–475)
Fibrinogen: 216 mg/dL (ref 204–475)

## 2016-05-24 LAB — APTT
aPTT: 47 seconds — ABNORMAL HIGH (ref 24–37)
aPTT: 45 seconds — ABNORMAL HIGH (ref 24–37)

## 2016-05-24 LAB — AMMONIA: AMMONIA: 58 umol/L — AB (ref 9–35)

## 2016-05-24 LAB — MAGNESIUM: MAGNESIUM: 1.7 mg/dL (ref 1.7–2.4)

## 2016-05-24 LAB — PROTIME-INR
INR: 2.02 — ABNORMAL HIGH (ref 0.00–1.49)
Prothrombin Time: 22.7 seconds — ABNORMAL HIGH (ref 11.6–15.2)

## 2016-05-24 MED ORDER — POTASSIUM CHLORIDE CRYS ER 20 MEQ PO TBCR
40.0000 meq | EXTENDED_RELEASE_TABLET | Freq: Once | ORAL | Status: AC
  Administered 2016-05-24: 40 meq via ORAL
  Filled 2016-05-24: qty 2

## 2016-05-24 MED ORDER — VITAMIN B-1 100 MG PO TABS
100.0000 mg | ORAL_TABLET | Freq: Every day | ORAL | Status: DC
  Administered 2016-05-25: 100 mg via ORAL
  Filled 2016-05-24: qty 1

## 2016-05-24 MED ORDER — FOLIC ACID 1 MG PO TABS
1.0000 mg | ORAL_TABLET | Freq: Every day | ORAL | Status: DC
  Administered 2016-05-25: 1 mg via ORAL
  Filled 2016-05-24: qty 1

## 2016-05-24 NOTE — Progress Notes (Signed)
PROGRESS NOTE    Jon Morgan  ZOX:[id number] DOB: 1961-05-27 DOA: 05/17/2016 PCP: Derwood KaplanEason,  Ernest B, MD   Brief Narrative:  on 05/17/2016 by Dr. Delphina CahillJose Angelo A de Dios Jon Morgan is a 55 y.o. male with PMH of HTN. He experienced a seizure at home on 05/25 and was subsequently transported to [facility] ED. Per EMS, pt was initially awake but later became unresponsive en route and had periods of apnea. In ED, he remained unresponsive and was unable to protect airway; therefore, he was intubated by EDP. [facility] was [facility] for further workup and management.  Assessment & Plan   Acute encephalopathy -Likely secondary to seizure versus subacute infarction versus neoplasm -Unknown if this is patient's current baseline -Neurology consulted and appreciated and ha signed off -HIV nonreactive -Ammonia 58 -Continue folic acid and thiamine  Seizure disorder -Neurology consultation appreciated -Continue Keppra -EEG on 05/20/2016 showed abnormal EEG consistent with nonspecific mild to moderate diffuse cerebral dysfunction  ? CVA -MRI showed 2.5 x 1.6 x 2.4 cm hemorrhagic lesion within the left parasagittal right frontoparietal region -Echocardiogram showed EF of 65 and 70% -LDL not calculated -Hemoglobin A1c 5.2 -Currently on SCDs due to anemia and questionable bleeding -PT and OT recommended SNF -Speech therapy recommended dysphagia 2 diet -Neurology appreciated -Total cholesterol 69, per documentation in neurology note, no statin recommended  Acute respiratory failure -Patient did require intubation however has successfully been extubated -Pulmonology was following, ?aspiration  Hyperammonemia -Secondary to liver cirrhosis. -Ammonia 58 today -Continue lactulose  Chronic Anemia -Patient has received 3 units PRBC during the hospitalization -Continue to monitor CBC -hemoglobin stable 7.7 today -Will speak to GI regarding further intervention -Continue iron  supplements  Hypercoagulopathy/thrombocytopenia -Secondary to liver cirrhosis -Patient received 2 units of FFP on 05/18/2016 and 05/19/2016 -Platelets currently stable, 96 today  History of liver cirrhosis/alcoholic cirrhosis/ hepatitis C -LFTs stable -Gastroenterology consulted and appreciated -Started on lactulose BID, nadolol, added anusol -[facility] -[facility]  Hypertension -Currently stable currently not any antihypertensives  Hyperchloremia -Continue to monitor BMP  Hypomagnesemia -Will continue to replace and monitor magnesium -Magnesium 1.7 today  Hematuria/Possible urinary tract infection -Resolved -Urine culture showed Escherichia coli, sensitive ciprofloxacin -Patient was on Levaquin however this was transitioned to ceftriaxone per PCCM  DVT Prophylaxis  SCDs  Code Status: Full  Family Communication: None at bedside  Disposition Plan: Admitted. Will transfer to tele floor.  Will need SNF  Consultants PCCM Neurology Gastroenterology  Procedures/Significant Events Intubated on 05/17/2016 EEG 05/20/2016  Antibiotics   Anti-infectives    Start     Dose/Rate Route Frequency Ordered Stop   05/21/16 1200  cefTRIAXone (ROCEPHIN) 1 g in dextrose 5 % 50 mL IVPB     1 g 100 mL/hr over 30 Minutes Intravenous Every 24 hours 05/21/16 1023 05/25/16 1159   05/18/16 0300  levofloxacin (LEVAQUIN) IVPB 750 mg  Status:  Discontinued     750 mg 100 mL/hr over 90 Minutes Intravenous Every 24 hours 05/18/16 0231 05/21/16 1024      Subjective:   Jon Morgan seen and examined today. Patient has no complaints.  Denies chest pain, shortness of breath, abdominal pain.   Objective:   Filed Vitals:   05/23/16 1922 05/23/16 2318 05/24/16 0331 05/24/16 0803  BP: 133/68 140/79 141/75 134/74  Pulse: 81 78 82 94  Temp: 98.2 F (36.8 C) 98.4 F (36.9 C) 98.4 F (36.9 C)  98.3 F (36.8 C)  TempSrc:  Oral Oral Oral Oral  Resp: Height:   6' (1.829 m)   Weight:   101.8 kg (224 lb 6.9 oz)   SpO2: 100% 100% 100% 100%    Intake/Output Summary (Last 24 hours) at 05/24/16 1043 Last data filed at 05/24/16 0804  Gross per 24 hour  Intake    110 ml  Output   1803 ml  Net  -1693 ml   Filed Weights   05/22/16 0446 05/23/16 0300 05/24/16 0331  Weight: 102.7 kg (226 lb 6.6 oz) 101.9 kg (224 lb 10.4 oz) 101.8 kg (224 lb 6.9 oz)    Exam  General: Well developed, well nourished, NAD  HEENT: NCAT,  mucous membranes moist. Poor dentition  Cardiovascular: S1 S2 auscultated, RRR, no murmurs  Respiratory: Clear to auscultation bilaterally   Abdomen: Soft, nontender, nondistended, + bowel sounds  Extremities: warm dry without cyanosis clubbing or edema  Neuro: No changes, AAOx 3 (self, place, time).  Data Reviewed: I have personally reviewed following labs and imaging studies  CBC:  Recent Labs Lab 05/21/16 0349 05/21/16 1059 05/21/16 1724 05/22/16 0425 05/23/16 0524 05/24/16 0533  WBC 5.7 6.3 5.4 5.2 4.9 5.4  NEUTROABS 3.7 4.2  --  3.3 3.0 3.4  HGB 7.0* 7.6* 7.1* 7.1* 7.2* 7.7*  HCT 22.3* 24.3* 22.8* 22.6* 23.1* 24.3*  MCV 85.4 87.7 86.7 86.6 88.5 86.2  PLT 98* 99* 91* 83* 85* 96*   Basic Metabolic Panel:  Recent Labs Lab 05/20/16 0313 05/20/16 0548 05/21/16 0349 05/22/16 0405 05/23/16 0524 05/24/16 0533  NA 141  --  141 138 136 133*  K 3.8  --  3.4* 3.4* 3.6 3.2*  CL 119*  --  117* 113* 112* 112*  CO2 18*  --  21* 21* 18* 18*  GLUCOSE 84  --  84 84 75 82  BUN 12  --  7 5* 5* <5*  CREATININE 0.78  --  0.74 0.68 0.62 0.60*  CALCIUM 8.6*  --  8.6* 8.4* 8.0* 8.1*  MG 1.7 1.7 1.6* 1.6* 1.7 1.7  PHOS 4.0  --  2.8 2.5 3.0 2.7   GFR: Estimated Creatinine Clearance: 131.9 mL/min (by C-G formula based on Cr of 0.6). Liver Function Tests:  Recent Labs Lab 05/19/16 0220 05/20/16 0313 05/21/16 0349 05/22/16 0405 05/23/16 0524 05/24/16 0533  AST 37   --   --   --  54*  --   ALT 24  --   --   --  25  --   ALKPHOS 41  --   --   --  51  --   BILITOT 6.1*  --   --   --  5.3*  --   PROT 6.1*  --   --   --  6.5  --   ALBUMIN 1.7*  1.7* 1.9* 1.8* 1.8* 1.8* 1.8*   No results for input(s): LIPASE, AMYLASE in the last 168 hours.  Recent Labs Lab 05/18/16 1352 05/19/16 0220 05/20/16 0313 05/23/16 1110 05/24/16 0533  AMMONIA 57* 46* 67* 98* 58*   Coagulation Profile:  Recent Labs Lab 05/21/16 0349 05/21/16 1724 05/22/16 0425 05/23/16 0524 05/24/16 0533  INR 1.89* 1.83* 1.83* 1.94* 2.02*   Cardiac Enzymes: No results for input(s): CKTOTAL, CKMB, CKMBINDEX, TROPONINI in the last 168 hours. BNP (last 3 results) No results for input(s): PROBNP in the last 8760 hours. HbA1C: No results for input(s): HGBA1C in the last 72  hours. CBG:  Recent Labs Lab 05/17/16 2310 05/18/16 1152 05/20/16 1957  GLUCAP 86 82 107*   Lipid Profile: No results for input(s): CHOL, HDL, LDLCALC, TRIG, CHOLHDL, LDLDIRECT in the last 72 hours. Thyroid Function Tests: No results for input(s): TSH, T4TOTAL, FREET4, T3FREE, THYROIDAB in the last 72 hours. Anemia Panel: No results for input(s): VITAMINB12, FOLATE, FERRITIN, TIBC, IRON, RETICCTPCT in the last 72 hours. Urine analysis:    Component Value Date/Time   COLORURINE AMBER* 05/16/2016 2000   APPEARANCEUR CLOUDY* 05/16/2016 2000   LABSPEC 1.011 05/16/2016 2000   PHURINE 7.0 05/16/2016 2000   GLUCOSEU NEGATIVE 05/16/2016 2000   HGBUR 1+* 05/16/2016 2000   BILIRUBINUR NEGATIVE 05/16/2016 2000   KETONESUR NEGATIVE 05/16/2016 2000   PROTEINUR 30* 05/16/2016 2000   NITRITE NEGATIVE 05/16/2016 2000   LEUKOCYTESUR 3+* 05/16/2016 2000   Sepsis Labs: @LABRCNTIP (procalcitonin:4,lacticidven:4)  ) Recent Results (from the past 240 hour(s))  Blood culture (routine x 2)     Status: None   Collection Time: 05/16/16  8:00 PM  Result Value Ref Range Status   Specimen Description BLOOD RIGHT FATTY  CASTS  Final   Special Requests   Final    BOTTLES DRAWN AEROBIC AND ANAEROBIC  4CCAERO, 3CCANA   Culture NO GROWTH 5 DAYS  Final   Report Status 05/21/2016 FINAL  Final  Blood culture (routine x 2)     Status: None   Collection Time: 05/16/16  8:00 PM  Result Value Ref Range Status   Specimen Description BLOOD LEFT FATTY CASTS  Final   Special Requests   Final    BOTTLES DRAWN AEROBIC AND ANAEROBIC  9CCAERO, 10CCANA   Culture NO GROWTH 5 DAYS  Final   Report Status 05/21/2016 FINAL  Final  Urine culture     Status: Abnormal   Collection Time: 05/16/16  8:00 PM  Result Value Ref Range Status   Specimen Description URINE, RANDOM  Final   Special Requests NONE  Final   Culture >=100,000 COLONIES/mL ESCHERICHIA COLI (A)  Final   Report Status 05/19/2016 FINAL  Final   Organism ID, Bacteria ESCHERICHIA COLI (A)  Final      Susceptibility   Escherichia coli - MIC*    AMPICILLIN <=2 SENSITIVE Sensitive     CEFAZOLIN <=4 SENSITIVE Sensitive     CEFTRIAXONE <=1 SENSITIVE Sensitive     CIPROFLOXACIN <=0.25 SENSITIVE Sensitive     GENTAMICIN <=1 SENSITIVE Sensitive     IMIPENEM <=0.25 SENSITIVE Sensitive     NITROFURANTOIN <=16 SENSITIVE Sensitive     TRIMETH/SULFA <=20 SENSITIVE Sensitive     AMPICILLIN/SULBACTAM <=2 SENSITIVE Sensitive     PIP/TAZO <=4 SENSITIVE Sensitive     Extended ESBL NEGATIVE Sensitive     * >=100,000 COLONIES/mL ESCHERICHIA COLI  MRSA PCR Screening     Status: None   Collection Time: 05/17/16  1:09 AM  Result Value Ref Range Status   MRSA by PCR NEGATIVE NEGATIVE Final    Comment:        The GeneXpert MRSA Assay (FDA approved for NASAL specimens only), is one component of a comprehensive MRSA colonization surveillance program. It is not intended to diagnose MRSA infection nor to guide or monitor treatment for MRSA infections.   Culture, respiratory (NON-Expectorated)     Status: None   Collection Time: 05/17/16  9:25 AM  Result Value Ref Range  Status   Specimen Description TRACHEAL ASPIRATE  Final   Special Requests NONE  Final   Gram Stain  Final    MODERATE WBC PRESENT, PREDOMINANTLY PMN RARE SQUAMOUS EPITHELIAL CELLS PRESENT MODERATE GRAM POSITIVE COCCI IN PAIRS RARE GRAM NEGATIVE COCCOBACILLI    Culture Consistent with normal respiratory flora.  Final   Report Status 05/19/2016 FINAL  Final  Culture, Urine     Status: Abnormal   Collection Time: 05/18/16  3:33 AM  Result Value Ref Range Status   Specimen Description URINE, CATHETERIZED  Final   Special Requests Normal  Final   Culture >=100,000 COLONIES/mL ESCHERICHIA COLI (A)  Final   Report Status 05/20/2016 FINAL  Final   Organism ID, Bacteria ESCHERICHIA COLI (A)  Final      Susceptibility   Escherichia coli - MIC*    AMPICILLIN <=2 SENSITIVE Sensitive     CEFAZOLIN <=4 SENSITIVE Sensitive     CEFTRIAXONE <=1 SENSITIVE Sensitive     CIPROFLOXACIN <=0.25 SENSITIVE Sensitive     GENTAMICIN <=1 SENSITIVE Sensitive     IMIPENEM <=0.25 SENSITIVE Sensitive     NITROFURANTOIN <=16 SENSITIVE Sensitive     TRIMETH/SULFA <=20 SENSITIVE Sensitive     AMPICILLIN/SULBACTAM <=2 SENSITIVE Sensitive     PIP/TAZO <=4 SENSITIVE Sensitive     * >=100,000 COLONIES/mL ESCHERICHIA COLI      Radiology Studies: No results found.   Scheduled Meds: . cefTRIAXone (ROCEPHIN)  IV  1 g Intravenous Q24H  . feeding supplement (ENSURE ENLIVE)  237 mL Oral TID BM  . ferrous sulfate  325 mg Oral Q breakfast  . folic acid  1 mg Intravenous Daily  . lactulose  20 g Oral BID  . levETIRAcetam  1,000 mg Intravenous Q12H  . nadolol  20 mg Oral Daily  . pantoprazole  40 mg Oral Daily  . thiamine  100 mg Intravenous Daily  . vitamin C  250 mg Oral BID   Continuous Infusions: . sodium chloride 10 mL (05/19/16 1148)     LOS: 7 days   Time Spent in minutes   35 minutes  Caryl Manas D.O. on 05/24/2016 at 10:43 AM  Between 7am to 7pm - Pager - [phone]  After 7pm go to  [email or web address] - password [email or web address]  And look for the night coverage person covering for me after hours  [facility] Office  [phone]

## 2016-05-24 NOTE — Progress Notes (Addendum)
Attending physician's note   I have taken an interval history, reviewed the chart and examined the patient. This note refers to Jennye MoccasinSarah Gribbin, [facility] progress note from earlier today. I agree with the Advanced Practitioner's note, impression and recommendations. Continue nadolol, lactulose, Fe, pantoprazole and Anusol PR. GI signing off. [facility].   Claudette HeadMalcolm Rebie Peale, MD Clementeen GrahamFACG [phone] Mon-Fri 8a-5p [phone] after 5p, weekends, holidays

## 2016-05-24 NOTE — Clinical Social Work Note (Signed)
Patient transferred from 3S to 52M. Will give handoff information to 52M CSW.  This CSW signing off.  Charlynn CourtSarah Rufina Kimery, CSW [phone]

## 2016-05-24 NOTE — Progress Notes (Signed)
Patient is admitted from [address]. Patient alert and oriented x 2. Patient made comfortable. Will continue to monitor.

## 2016-05-24 NOTE — Progress Notes (Signed)
Daily Rounding Note  05/24/2016, 8:48 AM  LOS: 7 days   SUBJECTIVE:       Brown, loose stools on Lactulose.  Remains confused.  No pain, no SOB.  OBJECTIVE:         Vital signs in last 24 hours:    Temp:  [98 F (36.7 C)-98.4 F (36.9 C)] 98.3 F (36.8 C) (06/02 0803) Pulse Rate:  [78-94] 94 (06/02 0803) Resp:  [23-27] 23 (06/02 0803) BP: (126-146)/(68-79) 134/74 mmHg (06/02 0803) SpO2:  [100 %] 100 % (06/02 0803) Weight:  [101.8 kg (224 lb 6.9 oz)] 101.8 kg (224 lb 6.9 oz) (06/02 0331) Last BM Date: 05/24/16 Filed Weights   05/22/16 0446 05/23/16 0300 05/24/16 0331  Weight: 102.7 kg (226 lb 6.6 oz) 101.9 kg (224 lb 10.4 oz) 101.8 kg (224 lb 6.9 oz)   General: comfortable.  Looks chronically ill   Heart: RRR Chest: clear bil.  No dyspnea or cough. Abdomen: NT, ND.  Active BS  Extremities: slight left UE edema Neuro/Psych:  Left sided weakness, oriented to name, not to year or place. Fully alert.  No asterixis.   Intake/Output from previous day: 06/01 0701 - 06/02 0700 In: 110 [IV Piggyback:110] Out: 1552 [Urine:1550; Stool:2]  Intake/Output this shift: Total I/O In: -  Out: 250 [Urine:250]  Lab Results:  Recent Labs  05/22/16 0425 05/23/16 0524 05/24/16 0533  WBC 5.2 4.9 5.4  HGB 7.1* 7.2* 7.7*  HCT 22.6* 23.1* 24.3*  PLT 83* 85* 96*   BMET  Recent Labs  05/22/16 0405 05/23/16 0524 05/24/16 0533  NA 138 136 133*  K 3.4* 3.6 3.2*  CL 113* 112* 112*  CO2 21* 18* 18*  GLUCOSE 84 75 82  BUN 5* 5* <5*  CREATININE 0.68 0.62 0.60*  CALCIUM 8.4* 8.0* 8.1*   LFT  Recent Labs  05/22/16 0405 05/23/16 0524 05/24/16 0533  PROT  --  6.5  --   ALBUMIN 1.8* 1.8* 1.8*  AST  --  54*  --   ALT  --  25  --   ALKPHOS  --  51  --   BILITOT  --  5.3*  --    PT/INR  Recent Labs  05/23/16 0524 05/24/16 0533  LABPROT 22.1* 22.7*  INR 1.94* 2.02*   Hepatitis Panel No results for input(s):  HEPBSAG, HCVAB, HEPAIGM, HEPBIGM in the last 72 hours.  Studies/Results: No results found.  ASSESMENT:   * Chronic anemia. On po iron in past. Not clear he took this recently but it was d/c'd due to ileus in 03/2016.Hgb low but stable.  Last of 4 PRBCs was on 5/28.   * Reported hx of bloody stool at initial arrival. Resolved. 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  Hx UGIB. 52016. S/p obliteration of Gastric varix, BRTO. On follow up EGD 04/2015: grade 1 esophageal varix and prtal gastropathy, but gastric varix unchanged in size. 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 No beta blocker in use PTA. Getting Rocephin starting 5/30 x 4 days.   * Cirrhosis of liver. Chronic alcoholism and Hep C. Still drinking up to time of recent admission.  No Hep C treatment due to non-compliance with follow up and ongoing ETOH abuse  * Hx HE, lactulose restarted 6/1.    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 Chronic.s/p FFP x 7.  S/p Vitamin K 10 mg IV x one 6/1.  Coags not improved.    *  Thrombocytopenia, chronic and stable.   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 Not taking Keppra as RX'd so admitted with recurrent seizure. Now restarted on Keppra   * Dysphagia per SLP eval: on D2 diet.   * Protein cal malnutrition. Albumin 1.8   *  Hyponatremia.  Hypokalemia.    PLAN   *  Per consult 6/1: added po iron. Given SQ Vitamin K.  Started Nadolol. Added Anusol PR.  *  [facility] signing off.  [facility]   *  Would benefit from pall care, goals of care consult.    Jennye MoccasinSarah Chance Munter  05/24/2016, 8:48 AM Pager: [phone]

## 2016-05-25 ENCOUNTER — Encounter (HOSPITAL_COMMUNITY): Payer: Self-pay | Admitting: *Deleted

## 2016-05-25 LAB — CBC WITH DIFFERENTIAL/PLATELET
BASOS ABS: 0 10*3/uL (ref 0.0–0.1)
BASOS PCT: 0 %
EOS PCT: 5 %
Eosinophils Absolute: 0.3 10*3/uL (ref 0.0–0.7)
HEMATOCRIT: 26.6 % — AB (ref 39.0–52.0)
Hemoglobin: 8.4 g/dL — ABNORMAL LOW (ref 13.0–17.0)
Lymphocytes Relative: 22 %
Lymphs Abs: 1.2 10*3/uL (ref 0.7–4.0)
MCH: 27.5 pg (ref 26.0–34.0)
MCHC: 31.6 g/dL (ref 30.0–36.0)
MCV: 86.9 fL (ref 78.0–100.0)
MONO ABS: 0.5 10*3/uL (ref 0.1–1.0)
MONOS PCT: 9 %
NEUTROS ABS: 3.7 10*3/uL (ref 1.7–7.7)
Neutrophils Relative %: 64 %
PLATELETS: 111 10*3/uL — AB (ref 150–400)
RBC: 3.06 MIL/uL — ABNORMAL LOW (ref 4.22–5.81)
RDW: 22.5 % — AB (ref 11.5–15.5)
WBC: 5.7 10*3/uL (ref 4.0–10.5)

## 2016-05-25 LAB — RENAL FUNCTION PANEL
ALBUMIN: 1.8 g/dL — AB (ref 3.5–5.0)
ANION GAP: 6 (ref 5–15)
BUN: <5 mg/dL — ABNORMAL LOW (ref 6–20)
CALCIUM: 8.5 mg/dL — AB (ref 8.9–10.3)
CO2: 19 mmol/L — AB (ref 22–32)
Chloride: 108 mmol/L (ref 101–111)
Creatinine, Ser: 0.55 mg/dL — ABNORMAL LOW (ref 0.61–1.24)
GFR calc non Af Amer: >60 mL/min (ref >60)
Glucose, Bld: 89 mg/dL (ref 65–99)
PHOSPHORUS: 2.6 mg/dL (ref 2.5–4.6)
POTASSIUM: 3.6 mmol/L (ref 3.5–5.1)
SODIUM: 133 mmol/L — AB (ref 135–145)

## 2016-05-25 LAB — APTT: aPTT: 48 seconds — ABNORMAL HIGH (ref 24–37)

## 2016-05-25 LAB — PROTIME-INR
INR: 2.13 — ABNORMAL HIGH (ref 0.00–1.49)
PROTHROMBIN TIME: 23.7 s — AB (ref 11.6–15.2)

## 2016-05-25 LAB — MAGNESIUM: Magnesium: 1.6 mg/dL — ABNORMAL LOW (ref 1.7–2.4)

## 2016-05-25 LAB — FIBRINOGEN: FIBRINOGEN: 195 mg/dL — AB (ref 204–475)

## 2016-05-25 MED ORDER — ENSURE ENLIVE PO LIQD: 237.0000 mL | Freq: Three times a day (TID) | ORAL | Status: DC

## 2016-05-25 MED ORDER — FERROUS SULFATE 325 (65 FE) MG PO TABS: 325.0000 mg | ORAL_TABLET | Freq: Every day | ORAL | Status: AC

## 2016-05-25 MED ORDER — LACTULOSE 10 GM/15ML PO SOLN: 20.0000 g | Freq: Two times a day (BID) | ORAL | Status: AC

## 2016-05-25 MED ORDER — NADOLOL 20 MG PO TABS: 20.0000 mg | ORAL_TABLET | Freq: Every day | ORAL | Status: AC

## 2016-05-25 MED ORDER — ASCORBIC ACID 250 MG PO TABS
250.0000 mg | ORAL_TABLET | Freq: Two times a day (BID) | ORAL | Status: AC
Start: 2016-05-25 — End: ?

## 2016-05-25 MED ORDER — FOLIC ACID 1 MG PO TABS: 1.0000 mg | ORAL_TABLET | Freq: Every day | ORAL | Status: AC

## 2016-05-25 MED ORDER — MAGNESIUM OXIDE 400 MG PO TABS: 400.0000 mg | ORAL_TABLET | Freq: Every day | ORAL | Status: AC

## 2016-05-25 MED ORDER — MAGNESIUM SULFATE 2 GM/50ML IV SOLN
2.0000 g | Freq: Once | INTRAVENOUS | Status: DC
  Filled 2016-05-25: qty 50

## 2016-05-25 MED ORDER — LEVETIRACETAM 1000 MG PO TABS: 1000.0000 mg | ORAL_TABLET | Freq: Two times a day (BID) | ORAL | Status: AC

## 2016-05-25 NOTE — Progress Notes (Signed)
Patient ready for discharge to SNF; report called to RN at [facility]; discharge instructions reviewed with patient and Rx's sent with ambulance transporters. Patient discharged via ambulance transport.

## 2016-05-25 NOTE — Discharge Instructions (Signed)
Seizure, Adult °A seizure means there is unusual activity in the brain. A seizure can cause changes in attention or behavior. Seizures often cause shaking (convulsions). Seizures often last from 30 seconds to 2 minutes. °HOME CARE  °· If you are given medicines, take them exactly as told by your doctor. °· Keep all doctor visits as told. °· Do not swim or drive until your doctor says it is okay. °· Teach others what to do if you have a seizure. They should: °¨ Lay you on the ground. °¨ Put a cushion under your head. °¨ Loosen any tight clothing around your neck. °¨ Turn you on your side. °¨ Stay with you until you get better. °GET HELP RIGHT AWAY IF:  °· The seizure lasts longer than 2 to 5 minutes. °· The seizure is very bad. °· The person does not wake up after the seizure. °· The person's attention or behavior changes. °Drive the person to the emergency room or call your local emergency services (911 in [place] °MAKE SURE YOU:  °· Understand these instructions. °· Will watch your condition. °· Will get help right away if you are not doing well or get worse. °  °This information is not intended to replace advice given to you by your health care provider. Make sure you discuss any questions you have with your health care provider. °  °Document Released: 05/27/2008 Document Revised: 03/02/2012 Document Reviewed: 07/21/2013 °Elsevier Interactive Patient Education ©2016 [employer] ° °

## 2016-05-25 NOTE — Discharge Summary (Addendum)
Physician Discharge Summary  Jon Morgan ZOX:[id number] DOB: 05/25/1961 DOA: 05/17/2016  PCP: Jon Kaplan, Jon [facility]  Admit date: 05/17/2016 Discharge date: 05/25/2016  Time spent: 45 minutes  Recommendations for Outpatient Follow-up:  Patient will be discharged to skilled nursing facility.  Continue physical and occupational therapy Patient will need to follow up with primary care provider within one week of discharge, repeat CBC and BMP and magnesium.  Follow up with neurology as an outpatient. Follow up with [facility] hepatology clinic. Patient should continue medications as prescribed.  Patient should follow a dysphagia 2 diet.   Discharge Diagnoses:  Acute encephalopathy Seizure disorder Possible CVA Acute respiratory failure Hyperammonemia Chronic anemia Hypercoagulopathy/thrombocytopenia History of liver cirrhosis/alcoholic cirrhosis/hepatitis C Hypertension Hypercholesteremia Hypomagnesemia Hematuria/possible urinary tract infection  Discharge Condition: Stable  Diet recommendation: Dysphagia2  Filed Weights   05/24/16 0331 05/24/16 1439 05/25/16 0400  Weight: 101.8 kg (224 lb 6.9 oz) 105.7 kg (233 lb 0.4 oz) 106.6 kg (235 lb 0.2 oz)    History of present illness:  on 05/17/2016 by Dr. Delphina Cahill A de Dios Jon Morgan is a 55 y.o. male with PMH of HTN. He experienced a seizure at home on 05/25 and was subsequently transported to [facility] ED. Per EMS, pt was initially awake but later became unresponsive en route and had periods of apnea. In ED, he remained unresponsive and was unable to protect airway; therefore, he was intubated by EDP. He was then transferred to [facility] ICU for further workup and management.  Hospital Course:  Acute encephalopathy -Likely secondary to seizure versus subacute infarction versus neoplasm -Unknown if this is patient's current baseline -Neurology consulted and appreciated and ha signed off -HIV nonreactive -Continue folic acid and thiamine -Should  follow up with neurology as an outpatient   Seizure disorder -Neurology consultation appreciated -Continue Keppra -EEG on 05/20/2016 showed abnormal EEG consistent with nonspecific mild to moderate diffuse cerebral dysfunction  ? CVA -MRI showed 2.5 x 1.6 x 2.4 cm hemorrhagic lesion within the left parasagittal right frontoparietal region -Echocardiogram showed EF of 65 and 70% -LDL not calculated -Hemoglobin A1c 5.2 -Currently on SCDs due to anemia and questionable bleeding -PT and OT recommended SNF -Speech therapy recommended dysphagia 2 diet -Neurology appreciated -Total cholesterol 69, per documentation in neurology note, no statin recommended  Acute respiratory failure -Patient did require intubation however has successfully been extubated -Pulmonology was following, ?aspiration  Hyperammonemia -Secondary to liver cirrhosis. -Ammonia 58 -Continue lactulose  Chronic Anemia -Patient has received 3 units PRBC during the hospitalization -hemoglobin stable 8.4 today -Will speak to GI regarding further intervention -Continue iron supplements -Repeat CBC in one week  Hypercoagulopathy/thrombocytopenia -Secondary to liver cirrhosis -Patient received 2 units of FFP on 05/18/2016 and 05/19/2016 -Platelets currently stable, 111 today  History of liver cirrhosis/alcoholic cirrhosis/ hepatitis C -LFTs stable -Gastroenterology consulted and appreciated -Started on lactulose BID, nadolol, added anusol -Patient had poor follow up with [facility] -Follow up with [facility] at discharge  Hypertension -Currently stable currently not any antihypertensives  Hyperchloremia -Resolved, repeat BMP in one week  Hypomagnesemia -Will discharge with oral supplementation  -repeat magnesium in one week  Hematuria/Possible urinary tract infection -Resolved -Urine culture showed Escherichia coli, sensitive ciprofloxacin -Patient was on Levaquin however this was transitioned  to ceftriaxone per [facility] Neurology Gastroenterology  Procedures/Significant Events Intubated on 05/17/2016 EEG 05/20/2016  Discharge Exam: Filed Vitals:   05/25/16 0533 05/25/16 0919  BP: 142/66 139/72  Pulse: 75 84  Temp: 98.3  F (36.8 C) 97.9 F (36.6 C)  Resp: 18 18    Exam  General: Well developed, well nourished, NAD  HEENT: NCAT, mucous membranes moist. Poor dentition  Cardiovascular: S1 S2 auscultated, RRR, no murmurs  Respiratory: Clear to auscultation bilaterally  Abdomen: Soft, nontender, nondistended, + bowel sounds  Extremities: warm dry without cyanosis clubbing or edema  Neuro: AAOx 3 (self, place, time). Paresis unchanged.  Discharge Instructions      Discharge Instructions    Discharge instructions    Complete by:  As directed   Patient will be discharged to skilled nursing facility.  Continue physical and occupational therapy Patient will need to follow up with primary care provider within one week of discharge, repeat CBC and BMP.  Follow up with neurology as an outpatient. Follow up with [facility] hepatology clinic. Patient should continue medications as prescribed.  Patient should follow a dysphagia 2 diet.            Medication List    STOP taking these medications        amLODipine 10 MG tablet  Commonly known as:  NORVASC      TAKE these medications        acetaminophen 650 MG CR tablet  Commonly known as:  TYLENOL  Take 650 mg by mouth every 4 (four) hours as needed for pain. (do not exceed 3 grams in 24 hours)     ascorbic acid 250 MG tablet  Commonly known as:  VITAMIN C  Take 1 tablet (250 mg total) by mouth 2 (two) times daily.     BIOFREEZE EX  Apply 4 g topically every 8 (eight) hours as needed (pain). Biofreeze 5%     feeding supplement (ENSURE ENLIVE) Liqd  Take 237 mLs by mouth 3 (three) times daily between meals.     ferrous sulfate 325 (65 FE) MG tablet  Take 1 tablet (325 mg total) by mouth daily  with breakfast.     fluticasone 50 MCG/ACT nasal spray  Commonly known as:  FLONASE  Place 1 spray into both nostrils daily.     folic acid 1 MG tablet  Commonly known as:  FOLVITE  Take 1 tablet (1 mg total) by mouth daily.     lactulose 10 GM/15ML solution  Commonly known as:  CHRONULAC  Take 30 mLs (20 g total) by mouth 2 (two) times daily.     levETIRAcetam 1000 MG tablet  Commonly known as:  KEPPRA  Take 1 tablet (1,000 mg total) by mouth 2 (two) times daily.     magnesium oxide 400 MG tablet  Commonly known as:  MAG-OX  Take 1 tablet (400 mg total) by mouth daily.     multivitamin with minerals Tabs tablet  Take 1 tablet by mouth daily.     nadolol 20 MG tablet  Commonly known as:  CORGARD  Take 1 tablet (20 mg total) by mouth daily.     omeprazole 20 MG capsule  Commonly known as:  PRILOSEC  Take 20 mg by mouth daily.     thiamine 100 MG tablet  Commonly known as:  VITAMIN B-1  Take 100 mg by mouth daily.      ASK your doctor about these medications        potassium chloride SA 20 MEQ tablet  Commonly known as:  K-DUR,KLOR-CON  Take 20 mEq by mouth daily.       No Known Allergies Follow-up Information    Follow up with [facility].   Specialty:  Skilled Nursing Facility   Contact information:   [address]  [place] [address] [phone]      Follow up with [facility]. Schedule an appointment as soon as possible for a visit in 1 week.   Specialty:  Internal Medicine   Why:  Hospital follow up   Contact information:   [address] [place] [address] [phone]       Follow up with [facility]. Schedule an appointment as soon as possible for a visit in 1 week.   Why:  Hospital follow up      Follow up with Jon Morgan,PRAMOD, [facility]. Schedule an appointment as soon as possible for a visit in 2 months.   Specialties:  Neurology, Radiology   Why:  Hospital follow up, stroke and seizure    Contact information:   [address] [place] [address] [phone]        The results of significant diagnostics from this hospitalization (including imaging, microbiology, ancillary and laboratory) are listed below for reference.    Significant Diagnostic Studies: Dg Chest 1 View  05/16/2016  CLINICAL DATA:  Seizure, unresponsive EXAM: CHEST 1 VIEW COMPARISON:  None. FINDINGS: Patient has been intubated with endotracheal tube about 3 cm above the carina. Heart size upper normal. Vascular pattern normal. Right lung clear. Hazy density left lung base. IMPRESSION: Hazy attenuation left lung base could reflect pleural effusion and/or consolidation. Possibilities include aspiration pneumonitis. Electronically Signed   By: Jon Heir M.D.   On: 05/16/2016 20:56   Dg Abd 1 View  05/21/2016  CLINICAL DATA:  GI bleeding. EXAM: ABDOMEN - 1 VIEW COMPARISON:  None. FINDINGS: No evidence of dilated bowel loops with colonic gas seen to the level the rectum. Multiple pelvic phleboliths noted. No other significant abnormality identified. IMPRESSION: Unremarkable bowel gas pattern.  No acute findings. Electronically Signed   By: Jon Rosenthal M.D.   On: 05/21/2016 13:00   Ct Head Wo Contrast  05/16/2016  CLINICAL DATA:  Seizure, unresponsive. EXAM: CT HEAD WITHOUT CONTRAST TECHNIQUE: Contiguous axial images were obtained from the base of the skull through the vertex without intravenous contrast. COMPARISON:  None. FINDINGS: Brain: Ill-defined low-density focus is seen within the upper right frontal lobe, near the vertex, measuring approximately 3 cm greatest dimension, with local mass effect on adjacent sulci. No midline shift or herniation. No other parenchymal lesion identified. There is mild generalized brain atrophy with commensurate dilatation of the ventricles and sulci. No parenchymal or extra-axial hemorrhage seen. Vascular: No hyperdense vessel or unexpected calcification. There are  chronic calcified atherosclerotic changes of the large vessels at the skull base. Skull: Negative for fracture or focal lesion. Sinuses/Orbits: No acute findings. Other: None. IMPRESSION: 1. Ill-defined low-density focus in the upper right frontal lobe, near the vertex, measuring approximately 3 cm greatest dimension. Associated local mass effect on adjacent sulci but no midline shift or herniation. Favor subacute infarction with associated edema. Neoplastic lesion cannot be confidently excluded. Consider MRI for further characterization. 2. No intracranial hemorrhage. These results were called by telephone at the time of interpretation on 05/16/2016 at 9:46 pm to Dr. Suella Broad , who verbally acknowledged these results. Electronically Signed   By: Bary Richard M.D.   On: 05/16/2016 21:47   Mr Maxine Glenn Head Wo Contrast  05/18/2016  CLINICAL DATA:  55 year old male with seizure 05/16/2016. Subsequent encounter. EXAM: MRA NECK WITHOUT AND WITH CONTRAST MRA HEAD  WITHOUT CONTRAST MRV HEAD WITHOUT CONTRAST TECHNIQUE: Multiplanar and multiecho pulse sequences of the neck were obtained without and with intravenous contrast. Angiographic images of the neck were obtained using MRA technique without and with intravenous contast.; Angiographic images of the Circle of Willis were obtained using MRA technique without intravenous contrast. CONTRAST:  20mL MULTIHANCE GADOBENATE DIMEGLUMINE 529 MG/ML IV SOLN COMPARISON:  05/17/2016 MR brain.  05/16/2016 head CT. FINDINGS: MRA NECK Exam is motion degraded. Limited evaluation of aortic arch and origin of great vessels. Evaluation carotid bifurcation limited without high-grade stenosis detected. Flow seen within both vertebral arteries. MRA HEAD Exam is motion degraded. Anterior circulation without medium or large size vessel significant stenosis or occlusion. Fetal contribution to posterior cerebral artery. Prominent infundibulum on the left. Mild narrowing proximal cavernous segment  right internal carotid artery. Middle cerebral artery branch vessel irregularity and narrowing bilaterally more notable on the left. Ectatic vertebral arteries and basilar artery without high-grade stenosis. Left vertebral artery dominant in size. Poor delineation left posterior inferior cerebellar artery. Moderate tandem stenosis posterior cerebral arteries more notable on the left. Please note that MR angiogram does not extend to the abnormality noted within the right parasagittal frontal -parietal region. MRV HEAD Exam is motion degraded. This limits evaluation however when taking current examination combined with the MR brain, major dural sinuses appear to be grossly patent with major drainage to the right. IMPRESSION: MRA NECK Exam is motion degraded. Limited evaluation of aortic arch and origin of great vessels. Evaluation carotid bifurcation limited without high-grade stenosis detected. Flow seen within both vertebral arteries. MRA HEAD Exam is motion degraded. Anterior circulation without medium or large size vessel significant stenosis or occlusion. Fetal contribution to posterior cerebral artery. Prominent infundibulum on the left. Mild narrowing proximal cavernous segment right internal carotid artery. Middle cerebral artery branch vessel irregularity and narrowing bilaterally more notable on the left. Ectatic vertebral arteries and basilar artery without high-grade stenosis. Left vertebral artery dominant in size. Poor delineation left posterior inferior cerebellar artery. Moderate tandem stenosis posterior cerebral arteries more notable on the left. Please note that MR angiogram does not extend to the abnormality noted within the right parasagittal frontal -parietal region. MRV HEAD Exam is motion degraded. This limits evaluation however when taking current examination combined with the MR brain, major dural sinuses appear to be grossly patent with major drainage to the right. Electronically Signed    By: Jon Duverney M.D.   On: 05/18/2016 17:28   Mr Angiogram Neck W Wo Contrast  05/18/2016  CLINICAL DATA:  55 year old male with seizure 05/16/2016. Subsequent encounter. EXAM: MRA NECK WITHOUT AND WITH CONTRAST MRA HEAD WITHOUT CONTRAST MRV HEAD WITHOUT CONTRAST TECHNIQUE: Multiplanar and multiecho pulse sequences of the neck were obtained without and with intravenous contrast. Angiographic images of the neck were obtained using MRA technique without and with intravenous contast.; Angiographic images of the Circle of Willis were obtained using MRA technique without intravenous contrast. CONTRAST:  20mL MULTIHANCE GADOBENATE DIMEGLUMINE 529 MG/ML IV SOLN COMPARISON:  05/17/2016 MR brain.  05/16/2016 head CT. FINDINGS: MRA NECK Exam is motion degraded. Limited evaluation of aortic arch and origin of great vessels. Evaluation carotid bifurcation limited without high-grade stenosis detected. Flow seen within both vertebral arteries. MRA HEAD Exam is motion degraded. Anterior circulation without medium or large size vessel significant stenosis or occlusion. Fetal contribution to posterior cerebral artery. Prominent infundibulum on the left. Mild narrowing proximal cavernous segment right internal carotid artery. Middle cerebral artery branch vessel irregularity and  narrowing bilaterally more notable on the left. Ectatic vertebral arteries and basilar artery without high-grade stenosis. Left vertebral artery dominant in size. Poor delineation left posterior inferior cerebellar artery. Moderate tandem stenosis posterior cerebral arteries more notable on the left. Please note that MR angiogram does not extend to the abnormality noted within the right parasagittal frontal -parietal region. MRV HEAD Exam is motion degraded. This limits evaluation however when taking current examination combined with the MR brain, major dural sinuses appear to be grossly patent with major drainage to the right. IMPRESSION: MRA NECK Exam  is motion degraded. Limited evaluation of aortic arch and origin of great vessels. Evaluation carotid bifurcation limited without high-grade stenosis detected. Flow seen within both vertebral arteries. MRA HEAD Exam is motion degraded. Anterior circulation without medium or large size vessel significant stenosis or occlusion. Fetal contribution to posterior cerebral artery. Prominent infundibulum on the left. Mild narrowing proximal cavernous segment right internal carotid artery. Middle cerebral artery branch vessel irregularity and narrowing bilaterally more notable on the left. Ectatic vertebral arteries and basilar artery without high-grade stenosis. Left vertebral artery dominant in size. Poor delineation left posterior inferior cerebellar artery. Moderate tandem stenosis posterior cerebral arteries more notable on the left. Please note that MR angiogram does not extend to the abnormality noted within the right parasagittal frontal -parietal region. MRV HEAD Exam is motion degraded. This limits evaluation however when taking current examination combined with the MR brain, major dural sinuses appear to be grossly patent with major drainage to the right. Electronically Signed   By: Jon Duverney M.D.   On: 05/18/2016 17:28   Mr Laqueta Jean EP Contrast  05/18/2016  CLINICAL DATA:  Initial evaluation for acute seizure. EXAM: MRI HEAD WITHOUT AND WITH CONTRAST TECHNIQUE: Multiplanar, multiecho pulse sequences of the brain and surrounding structures were obtained without and with intravenous contrast. CONTRAST:  20mL MULTIHANCE GADOBENATE DIMEGLUMINE 529 MG/ML IV SOLN COMPARISON:  Prior CT from 05/16/2016. FINDINGS: Diffuse prominence of the CSF containing spaces is compatible with generalized cerebral atrophy. Patchy and confluent T2/FLAIR hyperintensity within the periventricular and deep white matter both cerebral hemispheres most like related chronic small vessel ischemic disease, mild in nature. T1 hyperintensity  within the basal ganglia suggestive of underlying liver disease. Previously noted hypodense region within the parasagittal right frontal parietal region again seen. This measures 2.5 x 1.6 x 2.4 cm on today's exam (AP by a transverse by [email or web address]). This lesion demonstrates markedly increased restricted diffusion (Series 4, image 37). Hyperintense T2 signal intensity with FLAIR signal intensity seen centrally within this lesion as well.Associated rim of susceptibility artifact on gradient echo sequence (series 8, image 19). Patchy susceptibility artifact seen within the inferior aspect of the lesion as well (series 8, image 18). Mild associated edema about this lesion, most evident at its superior aspect (series 7, image 22). No significant rim enhancement about this lesion on post gadolinium sequences, although minimal patchy gyriform/parenchymal hands Min seen at its inferior aspect (series 13, image 9). Lesion is somewhat unusual, with differential considerations including a subacute hematoma, subacute infarction with hematoma formation, or intra cerebral abscess. Underlying mass is less favored given the relative lack of peripheral rim enhancement about this lesion. There is question of mildly increased diffusion abnormality at the posterior right hippocampus within the right temporal occipital region (series 4, image 22). Finding may reflect changes related to seizure or possibly subacute infarction, although no significant enhancement seen about this region. No other evidence for acute infarct. Gray-white matter differentiation  otherwise well-maintained. Major intracranial vascular flow voids are preserved. No other mass lesion. No mass effect or midline shift. No hydrocephalus. No extra-axial fluid collection. Major dural sinuses are grossly patent. Craniocervical junction normal. Visualized upper cervical spine unremarkable. Pituitary gland within normal limits. No acute abnormality about the globes and  orbits. Mild mucosal thickening within the maxillary sinuses. Paranasal sinuses are otherwise clear. Fluid within the posterior nasopharynx. Patient is likely intubated. Trace opacity within the bilateral mastoid air cells. Inner ear structures normal. Bone marrow signal intensity within normal limits. No scalp soft tissue abnormality. IMPRESSION: 1. 2.5 x 1.6 x 2.4 cm lesion within the parasagittal right frontoparietal region as above. This lesion is somewhat unusual, with differential considerations including a possible subacute hematoma or subacute infarction with hematoma formation. Possible intra cerebral abscess could also be considered, although this is perhaps less favored given the relative lack of significant edema or enhancement about this lesion, although an abscess could have this appearance if the patient is immunocompromised. Similarly, underlying mass is less favored given the relative lack of edema and intrinsic enhancement. A short interval follow-up study to evaluate for interval progression/regression may be helpful for further evaluation. 2. Question mildly increased diffusion abnormality within the posterior mesial right temporal lobe/ hippocampus, which may reflect changes related to seizure and/or subacute infarction. Correlation with EEG recommended. 3. Mildly increased T1 signal intensity within the bilateral basal ganglia, most often seen with underlying intrinsic liver disease. Correlation with laboratory values suggested. 4. Mild age-related cerebral atrophy with chronic small vessel ischemic disease. Electronically Signed   By: Jon MuBenjamin  McClintock M.D.   On: 05/18/2016 01:41   Dg Chest Port 1 View  05/18/2016  CLINICAL DATA:  Respiratory failure. EXAM: PORTABLE CHEST 1 VIEW COMPARISON:  May 17, 2016. FINDINGS: The heart size and mediastinal contours are within normal limits. Endotracheal tube is in grossly good position. Distal tip of nasogastric tube is unchanged in the expected  position of the distal esophagus. No pneumothorax or pleural effusion is noted. Both lungs are clear. The visualized skeletal structures are unremarkable. IMPRESSION: Endotracheal tube in grossly good position. Distal tip of nasogastric tube is unchanged in position, being in the expected position of the distal esophagus. Advancement is recommended. Electronically Signed   By: [facility]  Green Jr, M.D.   On: 05/18/2016 08:34   Dg Chest Port 1 View  05/17/2016  CLINICAL DATA:  Encounter for orogastric tube placement. EXAM: PORTABLE CHEST 1 VIEW COMPARISON:  Yesterday at 2036 hour FINDINGS: Endotracheal tube is 3.1 cm from the carina. Tip of the enteric tube in the region of the distal esophagus, side-port in the midesophagus. Cardiomediastinal contours are unchanged. Improving aeration at the left lung base with residual atelectasis. Lung volumes are low. No new abnormality is seen. IMPRESSION: 1. Enteric tube in the distal esophagus. Recommend advancement of at least 12 cm for optimal placement. 2. Improving left lung base aeration with residual atelectasis. Electronically Signed   By: Rubye OaksMelanie  Ehinger M.D.   On: 05/17/2016 05:01   Mr Mrv Head Wo Cm  05/18/2016  CLINICAL DATA:  55 year old male with seizure 05/16/2016. Subsequent encounter. EXAM: MRA NECK WITHOUT AND WITH CONTRAST MRA HEAD WITHOUT CONTRAST MRV HEAD WITHOUT CONTRAST TECHNIQUE: Multiplanar and multiecho pulse sequences of the neck were obtained without and with intravenous contrast. Angiographic images of the neck were obtained using MRA technique without and with intravenous contast.; Angiographic images of the Circle of Willis were obtained using MRA technique without intravenous contrast. CONTRAST:  20mL MULTIHANCE GADOBENATE DIMEGLUMINE 529 MG/ML IV SOLN COMPARISON:  05/17/2016 MR brain.  05/16/2016 head CT. FINDINGS: MRA NECK Exam is motion degraded. Limited evaluation of aortic arch and origin of great vessels. Evaluation carotid bifurcation  limited without high-grade stenosis detected. Flow seen within both vertebral arteries. MRA HEAD Exam is motion degraded. Anterior circulation without medium or large size vessel significant stenosis or occlusion. Fetal contribution to posterior cerebral artery. Prominent infundibulum on the left. Mild narrowing proximal cavernous segment right internal carotid artery. Middle cerebral artery branch vessel irregularity and narrowing bilaterally more notable on the left. Ectatic vertebral arteries and basilar artery without high-grade stenosis. Left vertebral artery dominant in size. Poor delineation left posterior inferior cerebellar artery. Moderate tandem stenosis posterior cerebral arteries more notable on the left. Please note that MR angiogram does not extend to the abnormality noted within the right parasagittal frontal -parietal region. MRV HEAD Exam is motion degraded. This limits evaluation however when taking current examination combined with the MR brain, major dural sinuses appear to be grossly patent with major drainage to the right. IMPRESSION: MRA NECK Exam is motion degraded. Limited evaluation of aortic arch and origin of great vessels. Evaluation carotid bifurcation limited without high-grade stenosis detected. Flow seen within both vertebral arteries. MRA HEAD Exam is motion degraded. Anterior circulation without medium or large size vessel significant stenosis or occlusion. Fetal contribution to posterior cerebral artery. Prominent infundibulum on the left. Mild narrowing proximal cavernous segment right internal carotid artery. Middle cerebral artery branch vessel irregularity and narrowing bilaterally more notable on the left. Ectatic vertebral arteries and basilar artery without high-grade stenosis. Left vertebral artery dominant in size. Poor delineation left posterior inferior cerebellar artery. Moderate tandem stenosis posterior cerebral arteries more notable on the left. Please note that MR  angiogram does not extend to the abnormality noted within the right parasagittal frontal -parietal region. MRV HEAD Exam is motion degraded. This limits evaluation however when taking current examination combined with the MR brain, major dural sinuses appear to be grossly patent with major drainage to the right. Electronically Signed   By: Jon Duverney M.D.   On: 05/18/2016 17:28    Microbiology: Recent Results (from the past 240 hour(s))  Blood culture (routine x 2)     Status: None   Collection Time: 05/16/16  8:00 PM  Result Value Ref Range Status   Specimen Description BLOOD RIGHT FATTY CASTS  Final   Special Requests   Final    BOTTLES DRAWN AEROBIC AND ANAEROBIC  4CCAERO, 3CCANA   Culture NO GROWTH 5 DAYS  Final   Report Status 05/21/2016 FINAL  Final  Blood culture (routine x 2)     Status: None   Collection Time: 05/16/16  8:00 PM  Result Value Ref Range Status   Specimen Description BLOOD LEFT FATTY CASTS  Final   Special Requests   Final    BOTTLES DRAWN AEROBIC AND ANAEROBIC  9CCAERO, 10CCANA   Culture NO GROWTH 5 DAYS  Final   Report Status 05/21/2016 FINAL  Final  Urine culture     Status: Abnormal   Collection Time: 05/16/16  8:00 PM  Result Value Ref Range Status   Specimen Description URINE, RANDOM  Final   Special Requests NONE  Final   Culture >=100,000 COLONIES/mL ESCHERICHIA COLI (A)  Final   Report Status 05/19/2016 FINAL  Final   Organism ID, Bacteria ESCHERICHIA COLI (A)  Final      Susceptibility   Escherichia coli - MIC*  AMPICILLIN <=2 SENSITIVE Sensitive     CEFAZOLIN <=4 SENSITIVE Sensitive     CEFTRIAXONE <=1 SENSITIVE Sensitive     CIPROFLOXACIN <=0.25 SENSITIVE Sensitive     GENTAMICIN <=1 SENSITIVE Sensitive     IMIPENEM <=0.25 SENSITIVE Sensitive     NITROFURANTOIN <=16 SENSITIVE Sensitive     TRIMETH/SULFA <=20 SENSITIVE Sensitive     AMPICILLIN/SULBACTAM <=2 SENSITIVE Sensitive     PIP/TAZO <=4 SENSITIVE Sensitive     Extended ESBL  NEGATIVE Sensitive     * >=100,000 COLONIES/mL ESCHERICHIA COLI  MRSA PCR Screening     Status: None   Collection Time: 05/17/16  1:09 AM  Result Value Ref Range Status   MRSA by PCR NEGATIVE NEGATIVE Final    Comment:        The GeneXpert MRSA Assay (FDA approved for NASAL specimens only), is one component of a comprehensive MRSA colonization surveillance program. It is not intended to diagnose MRSA infection nor to guide or monitor treatment for MRSA infections.   Culture, respiratory (NON-Expectorated)     Status: None   Collection Time: 05/17/16  9:25 AM  Result Value Ref Range Status   Specimen Description TRACHEAL ASPIRATE  Final   Special Requests NONE  Final   Gram Stain   Final    MODERATE WBC PRESENT, PREDOMINANTLY PMN RARE SQUAMOUS EPITHELIAL CELLS PRESENT MODERATE GRAM POSITIVE COCCI IN PAIRS RARE GRAM NEGATIVE COCCOBACILLI    Culture Consistent with normal respiratory flora.  Final   Report Status 05/19/2016 FINAL  Final  Culture, Urine     Status: Abnormal   Collection Time: 05/18/16  3:33 AM  Result Value Ref Range Status   Specimen Description URINE, CATHETERIZED  Final   Special Requests Normal  Final   Culture >=100,000 COLONIES/mL ESCHERICHIA COLI (A)  Final   Report Status 05/20/2016 FINAL  Final   Organism ID, Bacteria ESCHERICHIA COLI (A)  Final      Susceptibility   Escherichia coli - MIC*    AMPICILLIN <=2 SENSITIVE Sensitive     CEFAZOLIN <=4 SENSITIVE Sensitive     CEFTRIAXONE <=1 SENSITIVE Sensitive     CIPROFLOXACIN <=0.25 SENSITIVE Sensitive     GENTAMICIN <=1 SENSITIVE Sensitive     IMIPENEM <=0.25 SENSITIVE Sensitive     NITROFURANTOIN <=16 SENSITIVE Sensitive     TRIMETH/SULFA <=20 SENSITIVE Sensitive     AMPICILLIN/SULBACTAM <=2 SENSITIVE Sensitive     PIP/TAZO <=4 SENSITIVE Sensitive     * >=100,000 COLONIES/mL ESCHERICHIA COLI     Labs: Basic Metabolic Panel:  Recent Labs Lab 05/21/16 0349 05/22/16 0405 05/23/16 0524  05/24/16 0533 05/25/16 0559  NA 141 138 136 133* 133*  K 3.4* 3.4* 3.6 3.2* 3.6  CL 117* 113* 112* 112* 108  CO2 21* 21* 18* 18* 19*  GLUCOSE 84 84 75 82 89  BUN 7 5* 5* <5* <5*  CREATININE 0.74 0.68 0.62 0.60* 0.55*  CALCIUM 8.6* 8.4* 8.0* 8.1* 8.5*  MG 1.6* 1.6* 1.7 1.7 1.6*  PHOS 2.8 2.5 3.0 2.7 2.6   Liver Function Tests:  Recent Labs Lab 05/19/16 0220  05/21/16 0349 05/22/16 0405 05/23/16 0524 05/24/16 0533 05/25/16 0559  AST 37  --   --   --  54*  --   --   ALT 24  --   --   --  25  --   --   ALKPHOS 41  --   --   --  51  --   --  BILITOT 6.1*  --   --   --  5.3*  --   --   PROT 6.1*  --   --   --  6.5  --   --   ALBUMIN 1.7*  1.7*  < > 1.8* 1.8* 1.8* 1.8* 1.8*  < > = values in this interval not displayed. No results for input(s): LIPASE, AMYLASE in the last 168 hours.  Recent Labs Lab 05/18/16 1352 05/19/16 0220 05/20/16 0313 05/23/16 1110 05/24/16 0533  AMMONIA 57* 46* 67* 98* 58*   CBC:  Recent Labs Lab 05/21/16 1059 05/21/16 1724 05/22/16 0425 05/23/16 0524 05/24/16 0533 05/25/16 0559  WBC 6.3 5.4 5.2 4.9 5.4 5.7  NEUTROABS 4.2  --  3.3 3.0 3.4 3.7  HGB 7.6* 7.1* 7.1* 7.2* 7.7* 8.4*  HCT 24.3* 22.8* 22.6* 23.1* 24.3* 26.6*  MCV 87.7 86.7 86.6 88.5 86.2 86.9  PLT 99* 91* 83* 85* 96* 111*   Cardiac Enzymes: No results for input(s): CKTOTAL, CKMB, CKMBINDEX, TROPONINI in the last 168 hours. BNP: BNP (last 3 results)  Recent Labs  05/16/16 2000  BNP 38.0    ProBNP (last 3 results) No results for input(s): PROBNP in the last 8760 hours.  CBG:  Recent Labs Lab 05/20/16 1957  GLUCAP 107*       Signed:  Edsel Petrin  [facility] 05/25/2016, 12:21 PM  Coding Query  Decubitus ulcers -Present on admission -Wound care consulted.  -On right heel and stage 3 on sacrum

## 2016-05-25 NOTE — Clinical Social Work Note (Signed)
Clinical Social Worker facilitated patient discharge including contacting patient family and facility to confirm patient discharge plans.  Clinical information faxed to facility and family agreeable with plan.  CSW arranged ambulance transport via [facility] to [facility].  RN to call report prior to discharge.  Clinical Social Worker will sign off for now as social work intervention is no longer needed. Please consult us again if new need arises.  Derenda FennelBashira Haig Gerardo, MSW, LCSWA [phone] 05/25/2016 12:46 PM

## 2016-06-03 ENCOUNTER — Encounter: Payer: Self-pay | Admitting: *Deleted

## 2016-06-03 ENCOUNTER — Emergency Department: Payer: Medicaid Other

## 2016-06-03 ENCOUNTER — Inpatient Hospital Stay
Admission: EM | Admit: 2016-06-03 | Discharge: 2016-06-12 | DRG: 100 | Disposition: A | Discharge status: Other Acute Inpatient Hospital | Payer: Medicaid Other | Attending: Pulmonary Disease | Admitting: Pulmonary Disease

## 2016-06-03 DIAGNOSIS — R34 Anuria and oliguria: Secondary | ICD-10-CM | POA: Diagnosis not present

## 2016-06-03 DIAGNOSIS — K7460 Unspecified cirrhosis of liver: Secondary | ICD-10-CM | POA: Diagnosis present

## 2016-06-03 DIAGNOSIS — D696 Thrombocytopenia, unspecified: Secondary | ICD-10-CM | POA: Diagnosis present

## 2016-06-03 DIAGNOSIS — R569 Unspecified convulsions: Secondary | ICD-10-CM | POA: Diagnosis not present

## 2016-06-03 DIAGNOSIS — E8809 Other disorders of plasma-protein metabolism, not elsewhere classified: Secondary | ICD-10-CM | POA: Diagnosis present

## 2016-06-03 DIAGNOSIS — G40901 Epilepsy, unspecified, not intractable, with status epilepticus: Secondary | ICD-10-CM | POA: Insufficient documentation

## 2016-06-03 DIAGNOSIS — I69198 Other sequelae of nontraumatic intracerebral hemorrhage: Secondary | ICD-10-CM

## 2016-06-03 DIAGNOSIS — J9600 Acute respiratory failure, unspecified whether with hypoxia or hypercapnia: Secondary | ICD-10-CM | POA: Diagnosis not present

## 2016-06-03 DIAGNOSIS — Z79899 Other long term (current) drug therapy: Secondary | ICD-10-CM | POA: Diagnosis not present

## 2016-06-03 DIAGNOSIS — K7682 Hepatic encephalopathy: Secondary | ICD-10-CM | POA: Insufficient documentation

## 2016-06-03 DIAGNOSIS — Z789 Other specified health status: Secondary | ICD-10-CM

## 2016-06-03 DIAGNOSIS — K7291 Hepatic failure, unspecified with coma: Secondary | ICD-10-CM | POA: Diagnosis present

## 2016-06-03 DIAGNOSIS — I639 Cerebral infarction, unspecified: Secondary | ICD-10-CM | POA: Diagnosis present

## 2016-06-03 DIAGNOSIS — G40401 Other generalized epilepsy and epileptic syndromes, not intractable, with status epilepticus: Principal | ICD-10-CM | POA: Diagnosis present

## 2016-06-03 DIAGNOSIS — D649 Anemia, unspecified: Secondary | ICD-10-CM | POA: Diagnosis present

## 2016-06-03 DIAGNOSIS — R000 Tachycardia, unspecified: Secondary | ICD-10-CM | POA: Diagnosis present

## 2016-06-03 DIAGNOSIS — G40409 Other generalized epilepsy and epileptic syndromes, not intractable, without status epilepticus: Secondary | ICD-10-CM

## 2016-06-03 DIAGNOSIS — R064 Hyperventilation: Secondary | ICD-10-CM | POA: Diagnosis not present

## 2016-06-03 DIAGNOSIS — R680 Hypothermia, not associated with low environmental temperature: Secondary | ICD-10-CM | POA: Diagnosis not present

## 2016-06-03 DIAGNOSIS — E161 Other hypoglycemia: Secondary | ICD-10-CM | POA: Diagnosis not present

## 2016-06-03 DIAGNOSIS — D684 Acquired coagulation factor deficiency: Secondary | ICD-10-CM | POA: Diagnosis present

## 2016-06-03 DIAGNOSIS — E876 Hypokalemia: Secondary | ICD-10-CM | POA: Diagnosis not present

## 2016-06-03 DIAGNOSIS — E873 Alkalosis: Secondary | ICD-10-CM | POA: Diagnosis present

## 2016-06-03 DIAGNOSIS — G936 Cerebral edema: Secondary | ICD-10-CM | POA: Diagnosis present

## 2016-06-03 DIAGNOSIS — K7290 Hepatic failure, unspecified without coma: Secondary | ICD-10-CM | POA: Insufficient documentation

## 2016-06-03 DIAGNOSIS — I10 Essential (primary) hypertension: Secondary | ICD-10-CM | POA: Diagnosis present

## 2016-06-03 DIAGNOSIS — I959 Hypotension, unspecified: Secondary | ICD-10-CM | POA: Diagnosis present

## 2016-06-03 DIAGNOSIS — J9601 Acute respiratory failure with hypoxia: Secondary | ICD-10-CM | POA: Diagnosis present

## 2016-06-03 DIAGNOSIS — J9690 Respiratory failure, unspecified, unspecified whether with hypoxia or hypercapnia: Secondary | ICD-10-CM

## 2016-06-03 DIAGNOSIS — J9691 Respiratory failure, unspecified with hypoxia: Secondary | ICD-10-CM | POA: Diagnosis present

## 2016-06-03 DIAGNOSIS — N179 Acute kidney failure, unspecified: Secondary | ICD-10-CM | POA: Insufficient documentation

## 2016-06-03 DIAGNOSIS — G40909 Epilepsy, unspecified, not intractable, without status epilepticus: Secondary | ICD-10-CM

## 2016-06-03 DIAGNOSIS — R140 Abdominal distension (gaseous): Secondary | ICD-10-CM

## 2016-06-03 HISTORY — DX: Hepatic encephalopathy: K76.82

## 2016-06-03 HISTORY — DX: Unspecified cirrhosis of liver: K74.60

## 2016-06-03 HISTORY — DX: Unspecified convulsions: R56.9

## 2016-06-03 HISTORY — DX: Nontraumatic intracranial hemorrhage, unspecified: I62.9

## 2016-06-03 HISTORY — DX: Hepatic failure, unspecified without coma: K72.90

## 2016-06-03 LAB — COMPREHENSIVE METABOLIC PANEL
ALBUMIN: 2.3 g/dL — AB (ref 3.5–5.0)
ALT: 27 U/L (ref 17–63)
AST: 67 U/L — AB (ref 15–41)
Alkaline Phosphatase: 72 U/L (ref 38–126)
Anion gap: 6 (ref 5–15)
BUN: 9 mg/dL (ref 6–20)
CHLORIDE: 112 mmol/L — AB (ref 101–111)
CO2: 17 mmol/L — AB (ref 22–32)
CREATININE: 0.4 mg/dL — AB (ref 0.61–1.24)
Calcium: 8.9 mg/dL (ref 8.9–10.3)
GFR calc non Af Amer: >60 mL/min (ref >60)
Glucose, Bld: 95 mg/dL (ref 65–99)
Potassium: 4.3 mmol/L (ref 3.5–5.1)
SODIUM: 135 mmol/L (ref 135–145)
Total Bilirubin: 4.7 mg/dL — ABNORMAL HIGH (ref 0.3–1.2)
Total Protein: 7.6 g/dL (ref 6.5–8.1)

## 2016-06-03 LAB — BLOOD GAS, ARTERIAL
ACID-BASE DEFICIT: 4.5 mmol/L — AB (ref 0.0–2.0)
Allens test (pass/fail): POSITIVE — AB
Bicarbonate: 17.9 mEq/L — ABNORMAL LOW (ref 21.0–28.0)
FIO2: 0.4
MECHVT: 550 mL
O2 SAT: 99.2 %
PATIENT TEMPERATURE: 37
PCO2 ART: 24 mmHg — AB (ref 32.0–48.0)
PEEP: 5 cmH2O
PH ART: 7.48 — AB (ref 7.350–7.450)
PO2 ART: 130 mmHg — AB (ref 83.0–108.0)
RATE: 16 resp/min

## 2016-06-03 LAB — CBC WITH DIFFERENTIAL/PLATELET
Basophils Absolute: 0.1 10*3/uL (ref 0–0.1)
Basophils Relative: 1 %
EOS PCT: 3 %
Eosinophils Absolute: 0.2 10*3/uL (ref 0–0.7)
HCT: 30.8 % — ABNORMAL LOW (ref 40.0–52.0)
HEMOGLOBIN: 10.2 g/dL — AB (ref 13.0–18.0)
LYMPHS ABS: 1.8 10*3/uL (ref 1.0–3.6)
LYMPHS PCT: 27 %
MCH: 29.6 pg (ref 26.0–34.0)
MCHC: 33.3 g/dL (ref 32.0–36.0)
MCV: 88.8 fL (ref 80.0–100.0)
Monocytes Absolute: 0.7 10*3/uL (ref 0.2–1.0)
Monocytes Relative: 10 %
Neutro Abs: 3.9 10*3/uL (ref 1.4–6.5)
Neutrophils Relative %: 59 %
PLATELETS: 173 10*3/uL (ref 150–440)
RBC: 3.47 MIL/uL — AB (ref 4.40–5.90)
RDW: 26.6 % — ABNORMAL HIGH (ref 11.5–14.5)
WBC: 6.6 10*3/uL (ref 3.8–10.6)

## 2016-06-03 LAB — TROPONIN I: Troponin I: <0.03 ng/mL (ref <0.031)

## 2016-06-03 LAB — MAGNESIUM: Magnesium: 1.8 mg/dL (ref 1.7–2.4)

## 2016-06-03 LAB — MRSA PCR SCREENING: MRSA BY PCR: NEGATIVE

## 2016-06-03 MED ORDER — ETOMIDATE 2 MG/ML IV SOLN
30.0000 mg | Freq: Once | INTRAVENOUS | Status: AC
  Administered 2016-06-03: 30 mg via INTRAVENOUS

## 2016-06-03 MED ORDER — HEPARIN SODIUM (PORCINE) 5000 UNIT/ML IJ SOLN
5000.0000 [IU] | Freq: Three times a day (TID) | INTRAMUSCULAR | Status: DC
  Administered 2016-06-03 – 2016-06-04 (×3): 5000 [IU] via SUBCUTANEOUS
  Filled 2016-06-03 (×3): qty 1

## 2016-06-03 MED ORDER — IPRATROPIUM-ALBUTEROL 0.5-2.5 (3) MG/3ML IN SOLN: 3.0000 mL | RESPIRATORY_TRACT | Status: DC | PRN

## 2016-06-03 MED ORDER — SODIUM CHLORIDE 0.9 % IV SOLN
1500.0000 mg | Freq: Two times a day (BID) | INTRAVENOUS | Status: DC
  Administered 2016-06-04 – 2016-06-12 (×18): 1500 mg via INTRAVENOUS
  Filled 2016-06-03 (×20): qty 15

## 2016-06-03 MED ORDER — PROPOFOL 10 MG/ML IV BOLUS
0.5000 mg/kg | Freq: Once | INTRAVENOUS | Status: AC
  Administered 2016-06-03: 53.3 mg via INTRAVENOUS

## 2016-06-03 MED ORDER — SUCCINYLCHOLINE CHLORIDE 20 MG/ML IJ SOLN
100.0000 mg | Freq: Once | INTRAMUSCULAR | Status: AC
  Administered 2016-06-03: 100 mg via INTRAVENOUS

## 2016-06-03 MED ORDER — FAMOTIDINE IN NACL 20-0.9 MG/50ML-% IV SOLN
20.0000 mg | Freq: Two times a day (BID) | INTRAVENOUS | Status: DC
  Administered 2016-06-03 – 2016-06-04 (×2): 20 mg via INTRAVENOUS
  Filled 2016-06-03 (×4): qty 50

## 2016-06-03 MED ORDER — SODIUM CHLORIDE 0.9 % IV SOLN: 1000.0000 mg | Freq: Two times a day (BID) | INTRAVENOUS | Status: DC

## 2016-06-03 MED ORDER — PROPOFOL 1000 MG/100ML IV EMUL
5.0000 ug/kg/min | Freq: Once | INTRAVENOUS | Status: AC
  Administered 2016-06-03: 5 ug/kg/min via INTRAVENOUS
  Filled 2016-06-03: qty 100

## 2016-06-03 MED ORDER — LORAZEPAM 2 MG/ML IJ SOLN
2.0000 mg | Freq: Once | INTRAMUSCULAR | Status: AC
  Administered 2016-06-03: 2 mg via INTRAVENOUS

## 2016-06-03 MED ORDER — MIDAZOLAM HCL 5 MG/5ML IJ SOLN
5.0000 mg | Freq: Once | INTRAMUSCULAR | Status: AC
  Administered 2016-06-03: 5 mg via INTRAVENOUS

## 2016-06-03 MED ORDER — SODIUM CHLORIDE 0.9 % IV SOLN
1000.0000 mg | Freq: Once | INTRAVENOUS | Status: AC
  Administered 2016-06-03: 1000 mg via INTRAVENOUS
  Filled 2016-06-03: qty 10

## 2016-06-03 MED ORDER — PROPOFOL 1000 MG/100ML IV EMUL
5.0000 ug/kg/min | INTRAVENOUS | Status: DC
  Administered 2016-06-03: 15 ug/kg/min via INTRAVENOUS
  Filled 2016-06-03: qty 100

## 2016-06-03 MED ORDER — LACTATED RINGERS IV SOLN
INTRAVENOUS | Status: DC
  Administered 2016-06-03: 17:00:00 via INTRAVENOUS

## 2016-06-03 MED ORDER — LORAZEPAM 2 MG/ML IJ SOLN
INTRAMUSCULAR | Status: AC
  Filled 2016-06-03: qty 1

## 2016-06-03 MED ORDER — SODIUM CHLORIDE 0.9 % IV SOLN: 250.0000 mL | INTRAVENOUS | Status: DC | PRN

## 2016-06-03 MED ORDER — MIDAZOLAM HCL 5 MG/5ML IJ SOLN
INTRAMUSCULAR | Status: AC
  Administered 2016-06-03: 5 mg via INTRAVENOUS
  Filled 2016-06-03: qty 5

## 2016-06-03 NOTE — H&P (Signed)
PULMONARY / CRITICAL CARE MEDICINE   Name: Jon Morgan MRN: [id number] DOB: 1961/02/06    ADMISSION DATE:  06/03/2016 CONSULTATION DATE: 06/03/16  REFERRING MD:  EDP  CHIEF COMPLAINT:  Acute hypoxemic respiratory failure, s/p Seizures.  HISTORY OF PRESENT ILLNESS:    Jon Morgan, IS A 55 year male with past medical history significant for Hypertension, hepatic encephalopathy. Acute respiratory failure, possible CVA,, Liver cirrhosis. Patient was recently discharged from [facility] won 6/3 with acute respiratory failure s/p seizures. Patient was discharged to [facility] healthcare.  Patient was brought to [facility] on 6/12 by EMS. Patient was unresponsive. Patient responds only to painful stimuli 5 minutes after arrival, patient started having seizure which lasted close to 2 minutes states the staff. Patient was  intubated emergently for airway protection. [facility] M team was consulted for further management.  PAST MEDICAL HISTORY :  He  has a past medical history of Hypertension; Hepatic encephalopathy (HCC); Seizure (HCC); Intracranial hemorrhage (HCC); and Liver cirrhosis (HCC).  PAST SURGICAL HISTORY: He  has no past surgical history on file.  No Known Allergies  No current facility-administered medications on file prior to encounter.   Current Outpatient Prescriptions on File Prior to Encounter  Medication Sig  . acetaminophen (TYLENOL) 650 MG CR tablet Take 650 mg by mouth every 4 (four) hours as needed for pain.   . ferrous sulfate 325 (65 FE) MG tablet Take 1 tablet (325 mg total) by mouth daily with breakfast.  . fluticasone (FLONASE) 50 MCG/ACT nasal spray Place 1 spray into both nostrils daily.  . folic acid (FOLVITE) 1 MG tablet Take 1 tablet (1 mg total) by mouth daily.  Marland Kitchen lactulose (CHRONULAC) 10 GM/15ML solution Take 30 mLs (20 g total) by mouth 2 (two) times daily.  Marland Kitchen levETIRAcetam (KEPPRA) 1000 MG tablet Take 1 tablet (1,000 mg total) by mouth 2 (two)  times daily.  . magnesium oxide (MAG-OX) 400 MG tablet Take 1 tablet (400 mg total) by mouth daily.  . Menthol, Topical Analgesic, (BIOFREEZE EX) Apply 4 g topically every 8 (eight) hours as needed (for pain).   . Multiple Vitamin (MULTIVITAMIN WITH MINERALS) TABS tablet Take 1 tablet by mouth daily.  . nadolol (CORGARD) 20 MG tablet Take 1 tablet (20 mg total) by mouth daily.  Marland Kitchen omeprazole (PRILOSEC) 20 MG capsule Take 20 mg by mouth daily.  . vitamin C (VITAMIN C) 250 MG tablet Take 1 tablet (250 mg total) by mouth 2 (two) times daily.    FAMILY HISTORY:  His has no family status information on file.   SOCIAL HISTORY: He  reports that he does not use illicit drugs.  REVIEW OF SYSTEMS:   Unable to obtain   SUBJECTIVE:  Unable to obtain  VITAL SIGNS: BP 127/89 mmHg  Pulse 74  Temp(Src) 97.6 F (36.4 C) (Axillary)  Resp 22  Ht 6\' 1"  (1.854 m)  Wt 235 lb (106.595 kg)  BMI 31.01 kg/m2  SpO2 100%  HEMODYNAMICS:    VENTILATOR SETTINGS: Vent Mode:  [-] AC FiO2 (%):  [40 %] 40 % Set Rate:  [16 bmp] 16 bmp Vt Set:  [550 mL] 550 mL PEEP:  [5 cmH20] 5 cmH20  INTAKE / OUTPUT:    PHYSICAL EXAMINATION: General:  African-American male, intubated and on mechanical ventilation  Neuro:  Obtunded  HEENT: Atraumatic, normocephalic, pinpoint pupil, sluggishly reacting  Cardiovascular:  S1-S2, regular rate and rhythm, no MRG noted  Lungs:Coarse, no wheezes crackles or rhonchi noted Abdomen:  soft, nontender, active bowel sounds Musculoskeletal:  no obvious deformity/inflammation noted Skin:  grossly intact  LABS:  BMET  Recent Labs Lab 06/03/16 1416  NA 135  K 4.3  CL 112*  CO2 17*  BUN 9  CREATININE 0.40*  GLUCOSE 95    Electrolytes  Recent Labs Lab 06/03/16 1416  CALCIUM 8.9  MG 1.8    CBC  Recent Labs Lab 06/03/16 1416  WBC 6.6  HGB 10.2*  HCT 30.8*  PLT 173    Coag's No results for input(s): APTT, INR in the last 168 hours.  Sepsis  Markers No results for input(s): LATICACIDVEN, PROCALCITON, O2SATVEN in the last 168 hours.  ABG  Recent Labs Lab 06/03/16 1525  PHART 7.48*  PCO2ART 24*  PO2ART 130*    Liver Enzymes  Recent Labs Lab 06/03/16 1416  AST 67*  ALT 27  ALKPHOS 72  BILITOT 4.7*  ALBUMIN 2.3*    Cardiac Enzymes  Recent Labs Lab 06/03/16 1416  TROPONINI <0.03    Glucose No results for input(s): GLUCAP in the last 168 hours.  Imaging Ct Head Wo Contrast  06/03/2016  CLINICAL DATA:  Unresponsive, seizures, hypertension, cirrhosis EXAM: CT HEAD WITHOUT CONTRAST TECHNIQUE: Contiguous axial images were obtained from the base of the skull through the vertex without intravenous contrast. COMPARISON:  05/16/2016 FINDINGS: Generalized atrophy. Normal ventricular morphology. No midline shift or mass effect. Again identified parasagittal low-attenuation focus at the high RIGHT frontal lobe, 1.9 x 1.3 cm, appears smaller and better defined than on the previous study con suspect involving infarct. Additionally, new area of infarction identified involving the parasagittal RIGHT parietal lobe extending into the RIGHT occipital lobe with associated loss of gray-white differentiation consistent with infarct, acute to subacute in age. No intracranial hemorrhage or additional mass lesion. No extra-axial fluid collections. Small mild fluid dependently in the sphenoid sinus. Bones and remaining sinuses unremarkable. IMPRESSION: Generalized atrophy with interval decrease in size of the low-attenuation focus seen previously at the high RIGHT frontal lobe compatible with evolving subacute infarction. New areas of low attenuation and loss of gray-white differentiation in the posterior RIGHT parietal and occipital lobes compatible with acute to subacute infarction. No intracranial hemorrhage. Electronically Signed   By: Ulyses SouthwardMark  Boles M.D.   On: 06/03/2016 15:29   Dg Chest Port 1 View  06/03/2016  CLINICAL DATA:  Altered  mental status EXAM: PORTABLE CHEST 1 VIEW COMPARISON:  May 18, 2016 FINDINGS: Endotracheal tube tip is 2.8 cm above the carina. Nasogastric tube tip and side port are in the stomach. No pneumothorax. There is no edema or consolidation. Heart is borderline enlarged with pulmonary vascularity within normal limits. No adenopathy. IMPRESSION: Tube positions as described without pneumothorax. No edema or consolidation. Cardiac prominence stable. Electronically Signed   By: Bretta BangWilliam  Woodruff III M.D.   On: 06/03/2016 14:49     STUDIES:  6/12 urine toxicology>> 6/12 CT head>>Generalized atrophy with interval decrease in size of thelow-attenuation focus seen previously at the high RIGHT frontal lobecompatible with evolving subacute infarction.New areas of low attenuation and loss of gray-white differentiationin the posterior RIGHT parietal and occipital lobes compatible withacute to subacute infarction. No intracranial hemorrhage. CULTURES: None  ANTIBIOTICS: none  SIGNIFICANT EVENTS: 6/12 acute hypoxemic/hypercarbic respiratory failure status post seizure  LINES/TUBES: 6/12 ET tube>>  DISCUSSION: 55 year old male with hypertension, liver cirrhosis, hepatic  encephalopathy required intubation in the ED due to inability to protect airway due to seizures  PULMONARY A: Acute hypoxemic respiratory failure P:  Full vent support   wean as tolerated VAP prevention measures Spontaneous breathing trials in a.m. if stable Chest x-ray in a.m. Routine ABGs Propofol gtt. for sedation  CARDIOVASCULAR A:  History of hypertension  Continuous telemetry monitoring hemodynamics    hold Nadolol( home med)  RENAL A:   Hypoalbuminemia  P:   Replace electrolytes per ICU protocol Follow chemistry   GASTROINTESTINAL A:   History of liver cirrhosis P:   Nothing by mouth now Will start tube feeding tomorrow Pepcid for GI prophylaxis T bili elevated  HEMATOLOGIC A:   No active issues P:   Heparin for DVT prophylaxis SCDs  INFECTIOUS A:   No active issues P:    Monitor fever curve No indication of infection  ENDOCRINE A:   No acute issues  P:   Sugar checks intermittently Monitor glucose on BMP  NEUROLOGIC A:   Seizures Acute encephalopathy P:    -RASS goal:0 -1 -Neurology consulted -increased Keppra to 1500 mg per neurology -Propofol for sedation -CT head>negative for hemmorhahe. - follow UDS     Bincy Varughese,AG-ACNP Pulmonary & Critical Care  PCCM ATTENDING ATTESTATION:  I have evaluated patient with ANP Varughese, reviewed database in its entirety and discussed care plan in detail. In addition, this patient was discussed on multidisciplinary rounds.   PROBLEMS: Unresponsive after seizure Status epilepticus VDRF  PLAN: Vent settings established Vent bundle implemented Daily SBT as indicated Keppra load and dosing Neurology consultation Propofol gtt   Billy Fischer, MD PCCM service Mobile [phone] Pager [phone]

## 2016-06-03 NOTE — Progress Notes (Signed)
CCM pharmacy monitoring:  Pharmacy may adjust antibiotics for renal function.  6/12: No current orders for antibiotics. Will continue to follow.  Bari MantisKristin Shalisha Clausing PharmD Clinical Pharmacist 06/03/2016

## 2016-06-03 NOTE — ED Notes (Addendum)
Pt began seizing in room, turned on his side, foaming at mouth, MD brought to bedside, RT called

## 2016-06-03 NOTE — ED Notes (Signed)
Pt arrives via EMS from [facility] with staff complaints of unresponsive, staff states pt has only been responsive to pain since this AM, EMS reports CBG 92, 138/78, 99% RA, upon arrival pt withdraws from pain

## 2016-06-03 NOTE — ED Provider Notes (Signed)
Time Seen: Approximately 1400  I have reviewed the triage notes  Chief Complaint: Altered Mental Status   History of Present Illness: Jon Morgan is a 55 y.o. male *who was recently discharged from the hospital with a history of encephalopathy and seizure disorder. Patient apparently was transported here by EMS in a very sedate and lethargic state. EMS states they got little input from the patient's skilled nursing facility but they "" found him that way today "". Patient's obtunded and there was a brief recognized seizure activity upon arrival here to the emergency department of some tonic-clonic contractions of the left lower extremity a [place] nursing assessment. When I evaluated the patient he appears to be having difficulty protecting his airway with dry draw from the corner of his mouth and is very obtunded at this point I did not witness any seizure activity myself. It was determined the patient require acute airway management for presumed diagnosis of status post seizure and possibly status epilepticus.   Past Medical History  Diagnosis Date  . Hypertension   . Hepatic encephalopathy (HCC)   . Seizure (HCC)   . Intracranial hemorrhage (HCC)   . Liver cirrhosis [facility])     Patient Active Problem List   Diagnosis Date Noted  . Acute respiratory failure (HCC) 06/03/2016  . Hematochezia   . Cirrhosis of liver without ascites (HCC)   . Encounter for orogastric (OG) tube placement   . Right-sided nontraumatic intracerebral hemorrhage (HCC)   . 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 Seizures (HCC) 05/17/2016  . Pressure ulcer 05/17/2016  . Acute respiratory failure with hypoxia (HCC)   . Acute encephalopathy   . Thrombocytopenia (HCC)   . Essential hypertension     History reviewed. No pertinent past surgical history.  History reviewed. No pertinent past surgical history.  Current Outpatient Rx  Name  Route  Sig  Dispense  Refill  . acetaminophen  (TYLENOL) 650 MG CR tablet   Oral   Take 650 mg by mouth every 4 (four) hours as needed for pain.          . ferrous sulfate 325 (65 FE) MG tablet   Oral   Take 1 tablet (325 mg total) by mouth daily with breakfast.   30 tablet   0   . fluticasone (FLONASE) 50 MCG/ACT nasal spray   Each Nare   Place 1 spray into both nostrils daily.         . folic acid (FOLVITE) 1 MG tablet   Oral   Take 1 tablet (1 mg total) by mouth daily.   30 tablet   0   . lactulose (CHRONULAC) 10 GM/15ML solution   Oral   Take 30 mLs (20 g total) by mouth 2 (two) times daily.   240 mL   0   . levETIRAcetam (KEPPRA) 1000 MG tablet   Oral   Take 1 tablet (1,000 mg total) by mouth 2 (two) times daily.   60 tablet   0   . magnesium oxide (MAG-OX) 400 MG tablet   Oral   Take 1 tablet (400 mg total) by mouth daily.   30 tablet   0   . Menthol, Topical Analgesic, (BIOFREEZE EX)   Topical   Apply 4 g topically every 8 (eight) hours as needed (for pain).          . Multiple Vitamin (MULTIVITAMIN WITH MINERALS) TABS tablet   Oral   Take 1 tablet by mouth daily.         Marland Kitchen  nadolol (CORGARD) 20 MG tablet   Oral   Take 1 tablet (20 mg total) by mouth daily.   30 tablet   0   . omeprazole (PRILOSEC) 20 MG capsule   Oral   Take 20 mg by mouth daily.         Marland Kitchen thiamine (VITAMIN B-1) 100 MG tablet   Oral   Take 100 mg by mouth daily.         Marland Kitchen tuberculin 5 UNIT/0.1ML injection   Intradermal   Inject 5 Units into the skin once.         . vitamin C (VITAMIN C) 250 MG tablet   Oral   Take 1 tablet (250 mg total) by mouth 2 (two) times daily.   60 tablet   0     Allergies:  Review of patient's allergies indicates no known allergies.  Family History: History reviewed. No pertinent family history.  Social History: Social History  Substance Use Topics  . Smoking status: Unknown If Ever Smoked  . Smokeless tobacco: None  . Alcohol Use: None     Review of Systems:  Review of systems is limited to review the past medical record along with EMS notes and very minimal information from the nursing facility  Constitutional: No fever Eyes: No visual disturbances ENT: No sore throat, ear pain Cardiac: No chest pain Respiratory: No shortness of breath, wheezing, or stridor Abdomen: No abdominal pain, no vomiting, No diarrhea Endocrine: No weight loss, No night sweats Extremities: No peripheral edema, cyanosis Skin: No rashes, easy bruising Neurologic: No focal weakness, trouble with speech or swollowing Urologic: No dysuria, Hematuria, or urinary frequency   Physical Exam:  ED Triage Vitals  Enc Vitals Group     BP 06/03/16 1400 135/85 mmHg     Pulse Rate 06/03/16 1400 75     Resp 06/03/16 1400 24     Temp 06/03/16 1419 97.6 F (36.4 C)     Temp Source 06/03/16 1400 Oral     SpO2 06/03/16 1400 97 %     Weight 06/03/16 1400 235 lb (106.595 kg)     Height 06/03/16 1400  (1.854 m)     Head Cir --      Peak Flow --      Pain Score --      Pain Loc --      Pain Edu? --      Excl. in GC? --     General: Patient is obtunded with sonorous breathing. He does have a gag reflex but seems to be diminished Head: Normal cephalic , atraumatic Eyes: Pupils equal , round, reactive to light. Extraocular eye movements. Be intact with no recognizable nystagmus Nose/Throat: Patent upper airway with moist mucous membranes and poor dentition Neck: Supple, Full range of motion, No anterior adenopathy or palpable thyroid masses Lungs: Coarse rhonchi bilaterally especially at the bases and on the right side without any wheezes or rales. Heart: Regular rate, regular rhythm without murmurs , gallops , or rubs Abdomen: Soft, non tender without rebound, guarding , or rigidity; bowel sounds positive and symmetric in all 4 quadrants. No organomegaly .        Extremities: 2 plus symmetric pulses. No edema, clubbing or cyanosis Neurologic: GCS of 6 Skin: warm, dry, no  rashes   Labs:   All laboratory work was reviewed including any pertinent negatives or positives listed below:  Labs Reviewed  COMPREHENSIVE METABOLIC PANEL - Abnormal; Notable for the following:  Chloride 112 (*)    CO2 17 (*)    Creatinine, Ser 0.40 (*)    Albumin 2.3 (*)    AST 67 (*)    Total Bilirubin 4.7 (*)    All other components within normal limits  CBC WITH DIFFERENTIAL/PLATELET - Abnormal; Notable for the following:    RBC 3.47 (*)    Hemoglobin 10.2 (*)    HCT 30.8 (*)    RDW 26.6 (*)    All other components within normal limits  BLOOD GAS, ARTERIAL - Abnormal; Notable for the following:    pH, Arterial 7.48 (*)    pCO2 arterial 24 (*)    pO2, Arterial 130 (*)    Bicarbonate 17.9 (*)    Acid-base deficit 4.5 (*)    Allens test (pass/fail) POSITIVE (*)    All other components within normal limits  MAGNESIUM  TROPONIN I  URINE RAPID DRUG SCREEN, HOSP PERFORMED    EKG ED ECG REPORT I, Jennye MoccasinBrian S Quigley, the attending physician, personally viewed and interpreted this ECG.  Date: 06/03/2016 EKG Time: *1401 Rate: 76 Rhythm: normal sinus rhythm QRS Axis: normal Intervals: normal ST/T Wave abnormalities: normal Conduction Disturbances: none Narrative Interpretation: unremarkable Left ventricular hypertrophy No acute ischemic changes   Radiology:  Generalized atrophy.  Normal ventricular morphology.  No midline shift or mass effect.  Again identified parasagittal low-attenuation focus at the high RIGHT frontal lobe, 1.9 x 1.3 cm, appears smaller and better defined than on the previous study con suspect involving infarct.  Additionally, new area of infarction identified involving the parasagittal RIGHT parietal lobe extending into the RIGHT occipital lobe with associated loss of gray-white differentiation consistent with infarct, acute to subacute in age.  No intracranial hemorrhage or additional mass lesion.  No extra-axial fluid  collections.  Small mild fluid dependently in the sphenoid sinus.  Bones and remaining sinuses unremarkable.  IMPRESSION: Generalized atrophy with interval decrease in size of the low-attenuation focus seen previously at the high RIGHT frontal lobe compatible with evolving subacute infarction.  New areas of low attenuation and loss of gray-white differentiation in the posterior RIGHT parietal and occipital lobes compatible with acute to subacute infarction.  No intracranial hemorrhage.   Electronically Signed By: Ulyses SouthwardMark Boles M.D. On: 06/03/2016 15:29          DG Chest Port 1 View (Final result) Result time: 06/03/16 14:49:58   Final result by Rad Results In Interface (06/03/16 14:49:58)   Narrative:   CLINICAL DATA: Altered mental status  EXAM: PORTABLE CHEST 1 VIEW  COMPARISON: May 18, 2016  FINDINGS: Endotracheal tube tip is 2.8 cm above the carina. Nasogastric tube tip and side port are in the stomach. No pneumothorax. There is no edema or consolidation. Heart is borderline enlarged with pulmonary vascularity within normal limits. No adenopathy.  IMPRESSION: Tube positions as described without pneumothorax. No edema or consolidation. Cardiac prominence stable.   Electronically Signed By: Bretta BangWilliam Woodruff III M.D.        I personally reviewed the radiologic studies  Procedure: Rapid sequence intubation. Patient was hyperventilated and maintained a saturation of 98% with supplemental oxygen therapy and bag-valve-mask ventilation. The patient received etomidate 30 mg IV and succinylcholine 100 mg IV. After D fasciculations and the patient was totally relaxed he was intubated on a single pass with a 8.0 endotracheal tube. This was inflated and there was positive CO2 return was good visualization of the vocal cords. Breath sounds are equal bilaterally exam and chest x-ray was ordered.  I also established a oral gastric tube patient was paralyzed  him relax. There is positive changes in the epigastrium with insufflation of the oral gastric tube for good placement.  Critical Care:  CRITICAL CARE Performed by: Jennye Moccasin   Total critical care time: 47 minutes  Critical care time was exclusive of separately billable procedures and treating other patients.  Critical care was necessary to treat or prevent imminent or life-threatening deterioration.  Critical care was time spent personally by me on the following activities: development of treatment plan with patient and/or surrogate as well as nursing, discussions with consultants, evaluation of patient's response to treatment, examination of patient, obtaining history from patient or surrogate, ordering and performing treatments and interventions, ordering and review of laboratory studies, ordering and review of radiographic studies, pulse oximetry and re-evaluation of patient's condition. Initial stabilization with a patient with a history of cerebrovascular accident and seizure activity    ED Course: * Patient appears to arrive with an unstable airway with recent history of seizure activity. His old records were reviewed along with interviewing of the nursing staff and indirectly with the EMS crew. Patient tolerated his rapid sequence intubation which was performed to protect his airway. Patient was then sent to the head CT area with the above reading and was continued with IV sedation. Patient received first said and that was started on a propofol drip after return from the CAT scan area. I've spoken to the intensivist to advise them of the patient and impending ICU management. Patient was also given 1 g of IV Keppra as this appears on his medication list as therapy for epilepsy    Assessment: * Status epilepticus Possible acute cerebrovascular accident     Plan:  ICU transfer           Jennye Moccasin, MD 06/03/16 1550

## 2016-06-04 ENCOUNTER — Inpatient Hospital Stay: Payer: Medicaid Other

## 2016-06-04 DIAGNOSIS — R569 Unspecified convulsions: Secondary | ICD-10-CM | POA: Insufficient documentation

## 2016-06-04 LAB — URINE DRUG SCREEN, QUALITATIVE (ARMC ONLY)
Amphetamines, Ur Screen: NOT DETECTED
BARBITURATES, UR SCREEN: NOT DETECTED
BENZODIAZEPINE, UR SCRN: NOT DETECTED
CANNABINOID 50 NG, UR ~~LOC~~: NOT DETECTED
COCAINE METABOLITE, UR ~~LOC~~: NOT DETECTED
MDMA (Ecstasy)Ur Screen: NOT DETECTED
METHADONE SCREEN, URINE: NOT DETECTED
Opiate, Ur Screen: NOT DETECTED
Phencyclidine (PCP) Ur S: NOT DETECTED
TRICYCLIC, UR SCREEN: NOT DETECTED

## 2016-06-04 LAB — CBC
HEMATOCRIT: 29 % — AB (ref 40.0–52.0)
Hemoglobin: 9.8 g/dL — ABNORMAL LOW (ref 13.0–18.0)
MCH: 29.8 pg (ref 26.0–34.0)
MCHC: 33.8 g/dL (ref 32.0–36.0)
MCV: 88.2 fL (ref 80.0–100.0)
Platelets: 163 10*3/uL (ref 150–440)
RBC: 3.29 MIL/uL — ABNORMAL LOW (ref 4.40–5.90)
RDW: 27.6 % — AB (ref 11.5–14.5)
WBC: 6.7 10*3/uL (ref 3.8–10.6)

## 2016-06-04 LAB — PHOSPHORUS
PHOSPHORUS: 3.3 mg/dL (ref 2.5–4.6)
PHOSPHORUS: 3.5 mg/dL (ref 2.5–4.6)
PHOSPHORUS: 5.7 mg/dL — AB (ref 2.5–4.6)

## 2016-06-04 LAB — BLOOD GAS, ARTERIAL
ALLENS TEST (PASS/FAIL): POSITIVE — AB
Acid-base deficit: 7 mmol/L — ABNORMAL HIGH (ref 0.0–2.0)
Bicarbonate: 16.2 mEq/L — ABNORMAL LOW (ref 21.0–28.0)
FIO2: 40
O2 Saturation: 98.2 %
PEEP/CPAP: 5 cmH2O
PO2 ART: 106 mmHg (ref 83.0–108.0)
Patient temperature: 37
Pressure support: 10 cmH2O
pCO2 arterial: 25 mmHg — ABNORMAL LOW (ref 32.0–48.0)
pH, Arterial: 7.42 (ref 7.350–7.450)

## 2016-06-04 LAB — BASIC METABOLIC PANEL
Anion gap: 8 (ref 5–15)
BUN: 14 mg/dL (ref 6–20)
CALCIUM: 8.8 mg/dL — AB (ref 8.9–10.3)
CO2: 15 mmol/L — AB (ref 22–32)
CREATININE: 0.57 mg/dL — AB (ref 0.61–1.24)
Chloride: 117 mmol/L — ABNORMAL HIGH (ref 101–111)
GFR calc non Af Amer: >60 mL/min (ref >60)
Glucose, Bld: 85 mg/dL (ref 65–99)
Potassium: 3.5 mmol/L (ref 3.5–5.1)
Sodium: 140 mmol/L (ref 135–145)

## 2016-06-04 LAB — TRIGLYCERIDES: TRIGLYCERIDES: 58 mg/dL (ref <150)

## 2016-06-04 LAB — GLUCOSE, CAPILLARY
GLUCOSE-CAPILLARY: 84 mg/dL (ref 65–99)
GLUCOSE-CAPILLARY: 92 mg/dL (ref 65–99)
GLUCOSE-CAPILLARY: 89 mg/dL (ref 65–99)
Glucose-Capillary: 103 mg/dL — ABNORMAL HIGH (ref 65–99)
Glucose-Capillary: 86 mg/dL (ref 65–99)

## 2016-06-04 LAB — MAGNESIUM
MAGNESIUM: 1.8 mg/dL (ref 1.7–2.4)
Magnesium: 1.8 mg/dL (ref 1.7–2.4)
Magnesium: 2 mg/dL (ref 1.7–2.4)

## 2016-06-04 LAB — PROCALCITONIN: Procalcitonin: <0.1 ng/mL

## 2016-06-04 LAB — AMMONIA: Ammonia: 518 umol/L — ABNORMAL HIGH (ref 9–35)

## 2016-06-04 LAB — ACETAMINOPHEN LEVEL

## 2016-06-04 MED ORDER — NOREPINEPHRINE 4 MG/250ML-% IV SOLN
2.0000 ug/min | INTRAVENOUS | Status: DC
  Administered 2016-06-04: 3 ug/min via INTRAVENOUS
  Administered 2016-06-05: 10 ug/min via INTRAVENOUS
  Filled 2016-06-04: qty 250

## 2016-06-04 MED ORDER — NOREPINEPHRINE BITARTRATE 1 MG/ML IV SOLN
2.0000 ug/min | INTRAVENOUS | Status: DC
  Filled 2016-06-04: qty 4

## 2016-06-04 MED ORDER — PROPOFOL 1000 MG/100ML IV EMUL
5.0000 ug/kg/min | INTRAVENOUS | Status: DC
  Administered 2016-06-04: 31 ug/kg/min via INTRAVENOUS
  Administered 2016-06-04: 30 ug/kg/min via INTRAVENOUS
  Administered 2016-06-05: 31 ug/kg/min via INTRAVENOUS
  Administered 2016-06-05 (×3): 30 ug/kg/min via INTRAVENOUS
  Administered 2016-06-05: 31 ug/kg/min via INTRAVENOUS
  Administered 2016-06-06 (×4): 30 ug/kg/min via INTRAVENOUS
  Filled 2016-06-04 (×13): qty 100

## 2016-06-04 MED ORDER — CHLORHEXIDINE GLUCONATE 0.12% ORAL RINSE (MEDLINE KIT)
15.0000 mL | Freq: Two times a day (BID) | OROMUCOSAL | Status: DC
Start: 2016-06-04 — End: 2016-06-10
  Administered 2016-06-04 – 2016-06-09 (×12): 15 mL via OROMUCOSAL
  Filled 2016-06-04 (×13): qty 15

## 2016-06-04 MED ORDER — SODIUM CHLORIDE 0.9 % IV SOLN
50.0000 mg | Freq: Two times a day (BID) | INTRAVENOUS | Status: DC
  Administered 2016-06-04 – 2016-06-05 (×2): 50 mg via INTRAVENOUS
  Filled 2016-06-04 (×3): qty 5

## 2016-06-04 MED ORDER — LORAZEPAM 2 MG/ML IJ SOLN
2.0000 mg | Freq: Once | INTRAMUSCULAR | Status: AC
  Administered 2016-06-04: 2 mg via INTRAVENOUS

## 2016-06-04 MED ORDER — LORAZEPAM 2 MG/ML IJ SOLN
INTRAMUSCULAR | Status: AC
  Filled 2016-06-04: qty 2

## 2016-06-04 MED ORDER — GADOBENATE DIMEGLUMINE 529 MG/ML IV SOLN
20.0000 mL | Freq: Once | INTRAVENOUS | Status: AC | PRN
  Administered 2016-06-04: 18 mL via INTRAVENOUS

## 2016-06-04 MED ORDER — SODIUM CHLORIDE 0.9 % IV BOLUS (SEPSIS)
1000.0000 mL | Freq: Once | INTRAVENOUS | Status: AC
  Administered 2016-06-04: 1000 mL via INTRAVENOUS

## 2016-06-04 MED ORDER — VITAL HIGH PROTEIN PO LIQD: 1000.0000 mL | ORAL | Status: DC

## 2016-06-04 MED ORDER — FREE WATER
200.0000 mL | Freq: Three times a day (TID) | Status: DC
  Administered 2016-06-04 – 2016-06-10 (×15): 200 mL

## 2016-06-04 MED ORDER — LORAZEPAM 2 MG/ML IJ SOLN
4.0000 mg | Freq: Once | INTRAMUSCULAR | Status: AC
  Administered 2016-06-04: 4 mg via INTRAVENOUS

## 2016-06-04 MED ORDER — RIFABUTIN 150 MG PO CAPS: 300.0000 mg | ORAL_CAPSULE | Freq: Two times a day (BID) | ORAL | Status: DC

## 2016-06-04 MED ORDER — VECURONIUM BROMIDE 10 MG IV SOLR
INTRAVENOUS | Status: AC
  Administered 2016-06-04: 8 mg
  Filled 2016-06-04: qty 10

## 2016-06-04 MED ORDER — LACTULOSE 10 GM/15ML PO SOLN
30.0000 g | Freq: Two times a day (BID) | ORAL | Status: DC
  Administered 2016-06-04 – 2016-06-12 (×17): 30 g
  Filled 2016-06-04 (×17): qty 60

## 2016-06-04 MED ORDER — LORAZEPAM 2 MG/ML IJ SOLN
2.0000 mg | INTRAMUSCULAR | Status: DC | PRN
  Administered 2016-06-04 – 2016-06-08 (×11): 2 mg via INTRAVENOUS
  Filled 2016-06-04 (×12): qty 1

## 2016-06-04 MED ORDER — NOREPINEPHRINE 4 MG/250ML-% IV SOLN
INTRAVENOUS | Status: AC
  Filled 2016-06-04: qty 250

## 2016-06-04 MED ORDER — PROPOFOL 1000 MG/100ML IV EMUL: 5.0000 ug/kg/min | INTRAVENOUS | Status: DC

## 2016-06-04 MED ORDER — RIFAXIMIN 550 MG PO TABS
550.0000 mg | ORAL_TABLET | Freq: Three times a day (TID) | ORAL | Status: DC
  Administered 2016-06-04 – 2016-06-12 (×25): 550 mg
  Filled 2016-06-04 (×25): qty 1

## 2016-06-04 MED ORDER — PRO-STAT SUGAR FREE PO LIQD
30.0000 mL | Freq: Two times a day (BID) | ORAL | Status: DC
  Administered 2016-06-04 – 2016-06-05 (×2): 30 mL

## 2016-06-04 MED ORDER — SODIUM CHLORIDE 0.9 % IV SOLN
INTRAVENOUS | Status: DC
  Administered 2016-06-04 – 2016-06-05 (×2): via INTRAVENOUS

## 2016-06-04 MED ORDER — LORAZEPAM 2 MG/ML IJ SOLN
INTRAMUSCULAR | Status: AC
  Administered 2016-06-04: 2 mg
  Filled 2016-06-04: qty 1

## 2016-06-04 MED ORDER — FENTANYL 2500MCG IN NS 250ML (10MCG/ML) PREMIX INFUSION
0.0000 ug/h | INTRAVENOUS | Status: DC
  Administered 2016-06-04: 25 ug/h via INTRAVENOUS
  Filled 2016-06-04: qty 250

## 2016-06-04 MED ORDER — SODIUM CHLORIDE 0.9 % IV SOLN
1.0000 mg/h | INTRAVENOUS | Status: DC
  Administered 2016-06-04: 2 mg/h via INTRAVENOUS
  Administered 2016-06-04: 7 mg/h via INTRAVENOUS
  Administered 2016-06-05 – 2016-06-06 (×4): 10 mg/h via INTRAVENOUS
  Filled 2016-06-04 (×8): qty 10

## 2016-06-04 MED ORDER — VECURONIUM BROMIDE 10 MG IV SOLR
INTRAVENOUS | Status: AC
  Administered 2016-06-04: 10 mg
  Filled 2016-06-04: qty 10

## 2016-06-04 MED ORDER — VECURONIUM BOLUS VIA INFUSION: 10.0000 mg | Freq: Once | INTRAVENOUS | Status: DC

## 2016-06-04 MED ORDER — ANTISEPTIC ORAL RINSE SOLUTION (CORINZ)
7.0000 mL | Freq: Four times a day (QID) | OROMUCOSAL | Status: DC
  Administered 2016-06-04 – 2016-06-10 (×24): 7 mL via OROMUCOSAL
  Filled 2016-06-04 (×25): qty 7

## 2016-06-04 MED ORDER — FAMOTIDINE 40 MG/5ML PO SUSR
20.0000 mg | Freq: Two times a day (BID) | ORAL | Status: DC
  Administered 2016-06-04 – 2016-06-05 (×3): 20 mg
  Filled 2016-06-04 (×4): qty 2.5

## 2016-06-04 MED ORDER — SODIUM CHLORIDE 0.9 % IV BOLUS (SEPSIS): 250.0000 mL | Freq: Once | INTRAVENOUS | Status: DC

## 2016-06-04 MED ORDER — POTASSIUM CHLORIDE 20 MEQ/15ML (10%) PO SOLN
40.0000 meq | Freq: Two times a day (BID) | ORAL | Status: AC
  Administered 2016-06-04 (×2): 40 meq
  Filled 2016-06-04 (×2): qty 30

## 2016-06-04 NOTE — Procedures (Signed)
PROCEDURE NOTE: L IJ CVL PLACEMENT  INDICATION:    Monitoring of central venous pressures and/or administration of medications optimally administered in central vein  CONSENT:   Risks of procedure as well as the alternatives were explained to the patient or surrogate. Consent for procedure obtained. A time out was performed.   PROCEDURE  Sterile technique was used including antiseptics, cap, gloves, gown, hand hygiene, mask and full body sheet.  Skin prep: Chlorhexidine; local anesthetic administered  A triple lumen catheter was placed in the L IJ vein using the Seldinger technique.  Ultrasound was used for vessel identification and guidance.   EVALUATION:  Blood flow good  Complications: No apparent complications  Patient tolerated the procedure well.  Chest X-ray ordered to verify placement and is pending   Billy Fischeravid Bonner Larue, MD PCCM service Mobile [phone] Pager [phone] 06/04/2016

## 2016-06-04 NOTE — Progress Notes (Signed)
Patient with acute seizure like activity, on vent support, MRI confirms new Edema in PCA territory c/w with new infarct Patient [facility], I have called [facility] and Dr. Donnie Ahoilley with ICU has accepted the patient. Awaiting bed placement.  Will give 4 mg Ativan x 1 and then start versed infusion. [facility] and continuous EEG monitoring     Overall, patient is critically ill, prognosis is guarded.  Patient with Multiorgan failure and at high risk for cardiac arrest and death.    Lucie LeatherKurian David Melinna Linarez, M.D.  [facility]  Medical Director [facility] Medical Director [facility] Cardio[facility]

## 2016-06-04 NOTE — Discharge Summary (Signed)
Physician Discharge Summary  Patient ID: Jon Morgan MRN: [id number] DOB/AGE: Aug 21, 1961 55 y.o.  Admit date: 06/03/2016 Discharge date: 06/04/2016    Discharge Diagnoses:  -Acute hypoxemic respiratory failure -History of hypertension  -VDRF due to coma -Status epilepticus -Cirrhosis of liver -Hepatic Encephalopathy Primary respiratory alkalosis - likely due to hepatic coma. R/O sepsis as cause  Acute encephalopathy                                                                    DISCHARGE PLAN BY DIAGNOSIS    PULMONARY A: Acute hypoxemic respiratory failure P:  Full vent support  wean as tolerated VAP prevention measures Spontaneous breathing trials in a.m. if stable Chest x-ray in a.m. Routine ABGs Propofol gtt. for sedation  CARDIOVASCULAR A:  History of hypertension  Continuous telemetry monitoring hemodynamics   hold Nadolol( home med)  RENAL A:  Hypoalbuminemia  P:  Replace electrolytes per ICU protocol Follow chemistry   GASTROINTESTINAL A:  History of liver cirrhosis Hepatic encephalopathy Elevated ammonia P:  Nothing by mouth now Pepcid for GI prophylaxis followHepatic function panel  Lactulose/ rifaximin  HEMATOLOGIC A:  No active issues P:  Heparin for DVT prophylaxis SCDs  INFECTIOUS A:  No active issues ? Sepsis due to hyperventilation P:   Monitor fever curve No indication of infection Follow procalcitonin  ENDOCRINE A:  No acute issues  P:  Sugar checks intermittently Monitor glucose on BMP  NEUROLOGIC A:  Seizures Acute encephalopathy P:   -RASS goal:0 -1 -Neurology consulted -Continue Keppra to 1500 mg per neurology -Propofol for sedation -CT head>negative for hemmorhage - follow UDS -EEG today -follow MRI of brain                     DISCHARGE SUMMARY  Jon Morgan, IS A 55 year male with past medical history significant for Hypertension, hepatic encephalopathy.  Acute respiratory failure, possible CVA,, Liver cirrhosis. Patient was recently discharged from [facility] won 6/3 with acute respiratory failure s/p seizures. Patient was discharged to [facility]. Patient was brought to [facility] on 6/12 by EMS. Patient was unresponsive. Patient responds only to painful stimuli 5 minutes after arrival, patient started having seizure which lasted close to 2 minutes states the staff. Patient was intubated emergently for airway protection.Neurologist was consulted. Patient was noted to be actively seizing and therefore was started on versed drip.  Family member(Son) works at [employer] and would like the patient to be moved there.        SIGNIFICANT DIAGNOSTIC STUDIES 6/12 urine toxicology>> negative 6/12 CT head>>Generalized atrophy with interval decrease in size of thelow-attenuation focus seen previously at the high RIGHT frontal lobecompatible with evolving subacute infarction.New areas of low attenuation and loss of gray-white differentiationin the posterior RIGHT parietal and occipital lobes compatible withacute to subacute infarction. No intracranial hemorrhage 6/13 MRI brain>>New extensive cortically based restricted diffusion throughout both cerebral hemispheres which may reflect anoxic injury or thesequelae of recent seizure activity. Creutzfeldt-Jakob disease wasconsidered but is felt unlikely given the clinical presentation andrapid development of these findings.. New edema in the posteromedial right temporal and right occipital lobes suspicious for subacute PCA territory infarct.. Slightly decreased size of subacute right frontoparietal hematoma. SIGNIFICANT EVENTS 6/12  acute hypoxemic/hypercarbic respiratory failure status post seizure  MICRO DATA  none  ANTIBIOTICS none  CONSULTS 6/12 PCCM>> 6/12 Neurology  TUBES / LINES 6/12 ET tube>> 6/13 left IJ tlc   Discharge Exam: General: African-American male,  intubated and on mechanical ventilation  Neuro: Obtunded  HEENT: Atraumatic, normocephalic, pinpoint pupil, sluggishly reacting  Cardiovascular: S1-S2, regular rate and rhythm, no MRG noted  Lungs:Coarse, no wheezes crackles or rhonchi noted Abdomen: soft, nontender, active bowel sounds Musculoskeletal: no obvious deformity/inflammation noted Skin: grossly intact  Filed Vitals:   06/04/16 1000 06/04/16 1100 06/04/16 1200 06/04/16 1300  BP: 121/65 126/70 109/67 113/70  Pulse: 74 111 122 115  Temp:   98.4 F (36.9 C)   TempSrc:   Oral   Resp: Height:      Weight:      SpO2: 95% 95% 85% 96%     Discharge Labs  BMET  Recent Labs Lab 06/03/16 1416 06/04/16 0603 06/04/16 1126  NA 135 140  --   K 4.3 3.5  --   CL 112* 117*  --   CO2 17* 15*  --   GLUCOSE 95 85  --   BUN 9 14  --   CREATININE 0.40* 0.57*  --   CALCIUM 8.9 8.8*  --   MG 1.8 1.8 1.8  PHOS  --  3.3 3.5    CBC  Recent Labs Lab 06/03/16 1416 06/04/16 0603  HGB 10.2* 9.8*  HCT 30.8* 29.0*  WBC 6.6 6.7  PLT 173 163    Anti-Coagulation No results for input(s): INR in the last 168 hours.          Medication List    ASK your doctor about these medications        acetaminophen 650 MG CR tablet  Commonly known as:  TYLENOL  Take 650 mg by mouth every 4 (four) hours as needed for pain.     ascorbic acid 250 MG tablet  Commonly known as:  VITAMIN C  Take 1 tablet (250 mg total) by mouth 2 (two) times daily.     BIOFREEZE EX  Apply 4 g topically every 8 (eight) hours as needed (for pain).     ferrous sulfate 325 (65 FE) MG tablet  Take 1 tablet (325 mg total) by mouth daily with breakfast.     fluticasone 50 MCG/ACT nasal spray  Commonly known as:  FLONASE  Place 1 spray into both nostrils daily.     folic acid 1 MG tablet  Commonly known as:  FOLVITE  Take 1 tablet (1 mg total) by mouth daily.     lactulose 10 GM/15ML solution  Commonly known as:  CHRONULAC   Take 30 mLs (20 g total) by mouth 2 (two) times daily.     levETIRAcetam 1000 MG tablet  Commonly known as:  KEPPRA  Take 1 tablet (1,000 mg total) by mouth 2 (two) times daily.     magnesium oxide 400 MG tablet  Commonly known as:  MAG-OX  Take 1 tablet (400 mg total) by mouth daily.     multivitamin with minerals Tabs tablet  Take 1 tablet by mouth daily.     nadolol 20 MG tablet  Commonly known as:  CORGARD  Take 1 tablet (20 mg total) by mouth daily.     omeprazole 20 MG capsule  Commonly known as:  PRILOSEC  Take 20 mg by mouth daily.     thiamine 100 MG tablet  Commonly known as:  VITAMIN B-1  Take 100 mg by mouth daily.     tuberculin 5 UNIT/0.1ML injection  Inject 5 Units into the skin once.         Disposition: [facility]  Time spent on disposition:  Greater than 45 minutes.    Bincy Varughese,AG-ACNP Pulmonary & Critical Care  STAFF NOTE: I, Dr. Lucie LeatherKurian David Rodney Yera,  have personally reviewed patient's available data, including medical history, events of note, physical examination and test results as part of my evaluation. I have discussed with NP and other care providers such as pharmacist, RN and RRT.  In addition,  I personally evaluated patient and elicited key findings     A:acuet status epilepticus from acute CVA  P:[facility]      The Rest per NP whose note is outlined above and that I agree with  I have personally reviewed/obtained a history, examined the patient, evaluated Pertinent laboratory and RadioGraphic/imaging results, and  formulated the assessment and plan   The Patient requires high complexity decision making for assessment and support, frequent evaluation and titration of therapies, application of advanced monitoring technologies and extensive interpretation of multiple databases. Critical Care Time devoted to patient care services described in this note is 45 minutes.  This Critical care time does not  reflrect procedure time or supervisory time of NP but could involve care discussion time Overall, patient is critically ill, prognosis is guarded.  Patient with Multiorgan failure and at high risk for cardiac arrest and death.    Lucie LeatherKurian David Martine Trageser, M.D.  [facility]  Medical Director [facility] Medical Director [facility] Cardio-P[facility]

## 2016-06-04 NOTE — Progress Notes (Signed)
CDS called per protocol. Pt GCS is 3. Spoke with [facility]. Referral Number [id number]. [facility].

## 2016-06-04 NOTE — Progress Notes (Signed)
Pt was observed by nurse to have seizure like activity at 1100. Dr.Simmonds called in to room. Orders given for ativan.   Pt to MRI at 1430. Pt had observed seizure like activity, given PRN ativan. Upon returning to the room pt's seizure like activity increased to include pt's arm, and head. Activity was becoming more pronounced and violent.  Bincy, NP called to bedside. Pt sedated with ativan. Vec, and versed. Pt's unresponsive through out my shift today. No response to nailbed pressure. Pupils are now 6 equal and reactive.   Pt is non reactive to sternal rub. Report coming to oncoming nurse.

## 2016-06-04 NOTE — Progress Notes (Signed)
eLink Physician-Brief Progress Note Patient Name: Jon PatteeWayne Hengel DOB: 07/19/1961 MRN: [id number]   Date of Service  06/04/2016  HPI/Events of Note  ABG on 28%/PRVC 16/TV 550/P 5 = 7.62/<19/148. However, he is over breathing the set ventilator rate at 26. BP = 92/65. Already on a Propofol IV infusion.  eICU Interventions  Will order: 1. 0.9 NaCl 1 liter IV over 1 hour now. 2. Fentanyl IV infusion. Titrate to RASS = -1 to -2.  3. Wean Propofol IV infusion as tolerated.      Intervention Category Major Interventions: Acid-Base disturbance - evaluation and management;Respiratory failure - evaluation and management  Lenell AntuSommer,Steven Eugene 06/04/2016, 5:45 AM

## 2016-06-04 NOTE — Progress Notes (Signed)
Initial Nutrition Assessment  DOCUMENTATION CODES:   Not applicable  INTERVENTION:  -TF: Adult Tube Feeding Protocol initiated by MD today; recommend goal rate of 65 ml/hr 1560 mL in 24 hours) with continued use of Prostat at present; provides 1760 kcals, 167 g of protein. Kcals from diprivan taken into account. Order for additional free water of 200 mL q 8 hours ordered by MD; total free water from TF and flushes is 1910 mL. Pt on PEPuP protocol; continue to assess   NUTRITION DIAGNOSIS:   Inadequate oral intake related to acute illness as evidenced by NPO status.  GOAL:   Provide needs based on ASPEN/SCCM guidelines  MONITOR:   Vent status, TF tolerance, Weight trends, Labs  REASON FOR ASSESSMENT:   Ventilator, Consult Enteral/tube feeding initiation and management  ASSESSMENT:   55 yo male admitted with acute respiratory failure requiring intubation on 06/03/16, hepatic encephalopathy, seizures. Pt [facility]16 after admission for acute respiratory failure s/p seizures requiring intubation55 yo male admitted with acute respiratory failure requiring intubation on 06/03/16, hepatic encephalopathy, seizures. Pt [facility] 05/25/16 after admission for acute respiratory failure s/p seizures requiring intubation  Patient is currently intubated on ventilator support MV: 16 L/min Temp (24hrs), Avg:97.8 F (36.6 C), Min:97.5 F (36.4 C), Max:98.4 F (36.9 C)  Diprivan: 15.4 ml/hr (407 kcals in 24 hours)  Past Medical History  Diagnosis Date  . Hypertension   . Hepatic encephalopathy (HCC)   . Seizure (HCC)   . Intracranial hemorrhage (HCC)   . Liver cirrhosis (HCC)    Diet Order:   NPO  Skin:   (stage III sacrum, deep tissue injury heel)  Last BM:  06/03/16   Labs: reviewed  Meds: lactulose, potassium chloride, diprivan, xifaxan  Height:   Ht Readings from Last 1 Encounters:  06/03/16 6\' 1"  (1.854 m)     Weight:   Wt Readings from Last 1 Encounters:  06/04/16 188 lb 11.4 oz (85.6 kg)    BMI:  Body mass index is 24.9 kg/(m^2).  Estimated Nutritional Needs:   Kcal:  2163 kcals  Protein:  129-172 g   Fluid:  >/= 2 L  EDUCATION NEEDS:   No education needs identified at this time  Romelle StarcherCate Makenzye Troutman MS, RD, LDN [phone] Pager  [phone] Weekend/On-Call Pager

## 2016-06-04 NOTE — Consult Note (Signed)
Reason for Consult:Seizure Referring Physician: Alva Garnet  CC: Seizure  HPI: Jon Morgan is an 55 y.o. male recently discharged on 6/3 from [facility] where he was seen for seizures and ICH versus infarct with hemorrhagic transformation.  Patient was discharged on [place].  Patient brought to the ED on yesterday after being found unresponsive.  Was later noted to have a GTC seizure.  Patient required intubation and sedation.    Past Medical History  Diagnosis Date  . Hypertension   . Hepatic encephalopathy ([place])   . Seizure ([place])   . Intracranial hemorrhage ([place])   . Liver cirrhosis ([place])     Past surgical history: Unable to obtain due to intubation and sedation  Family history: Unable to obtain due to intubation and sedation  Social History:  reports that he does not use illicit drugs. His tobacco and alcohol histories are not on file.  No Known Allergies  Medications:  I have reviewed the patient's current medications. Prior to Admission:  Prescriptions prior to admission  Medication Sig Dispense Refill Last Dose  . acetaminophen (TYLENOL) 650 MG CR tablet Take 650 mg by mouth every 4 (four) hours as needed for pain.    Past Week at Unknown time  . ferrous sulfate 325 (65 FE) MG tablet Take 1 tablet (325 mg total) by mouth daily with breakfast. 30 tablet 0 06/02/2016 at Unknown time  . fluticasone (FLONASE) 50 MCG/ACT nasal spray Place 1 spray into both nostrils daily.   06/02/2016 at Unknown time  . folic acid (FOLVITE) 1 MG tablet Take 1 tablet (1 mg total) by mouth daily. 30 tablet 0 06/02/2016 at Unknown time  . lactulose (CHRONULAC) 10 GM/15ML solution Take 30 mLs (20 g total) by mouth 2 (two) times daily. 240 mL 0 06/02/2016 at Unknown time  . levETIRAcetam (KEPPRA) 1000 MG tablet Take 1 tablet (1,000 mg total) by mouth 2 (two) times daily. 60 tablet 0 06/02/2016 at Unknown time  . magnesium oxide (MAG-OX) 400 MG tablet Take 1 tablet (400 mg total) by mouth daily. 30 tablet 0 06/02/2016 at  Unknown time  . Menthol, Topical Analgesic, (BIOFREEZE EX) Apply 4 g topically every 8 (eight) hours as needed (for pain).    Past Month at Unknown time  . Multiple Vitamin (MULTIVITAMIN WITH MINERALS) TABS tablet Take 1 tablet by mouth daily.   06/02/2016 at Unknown time  . nadolol (CORGARD) 20 MG tablet Take 1 tablet (20 mg total) by mouth daily. 30 tablet 0 06/02/2016 at 0900  . omeprazole (PRILOSEC) 20 MG capsule Take 20 mg by mouth daily.   06/02/2016 at Unknown time  . thiamine (VITAMIN B-1) 100 MG tablet Take 100 mg by mouth daily.   06/02/2016 at Unknown time  . tuberculin 5 UNIT/0.1ML injection Inject 5 Units into the skin once.   06/02/2016 at 2308  . vitamin C (VITAMIN C) 250 MG tablet Take 1 tablet (250 mg total) by mouth 2 (two) times daily. 60 tablet 0 06/03/2016 at Unknown time   Scheduled: . antiseptic oral rinse  7 mL Mouth Rinse QID  . chlorhexidine gluconate (SAGE KIT)  15 mL Mouth Rinse BID  . famotidine  20 mg Per Tube BID  . feeding supplement (PRO-STAT SUGAR FREE 64)  30 mL Per Tube BID  . feeding supplement (VITAL HIGH PROTEIN)  1,000 mL Per Tube Q24H  . free water  200 mL Per Tube Q8H  . heparin  5,000 Units Subcutaneous Q8H  . lactulose  30 g Per  Tube BID  . levETIRAcetam  1,500 mg Intravenous Q12H  . LORazepam      . potassium chloride  40 mEq Per Tube BID  . rifaximin  550 mg Per Tube TID    ROS: Unable to obtain due to intubation and sedation  Physical Examination: Blood pressure 108/70, pulse 90, temperature 98 F (36.7 C), temperature source Oral, resp. rate 16, height '6\' 1"'  (1.854 m), weight 85.6 kg (188 lb 11.4 oz), SpO2 98 %.  HEENT-  Normocephalic, no lesions, without obvious abnormality.  Normal external eye and conjunctiva.  Normal TM's bilaterally.  Normal auditory canals and external ears. Normal external nose, mucus membranes and septum.  Normal pharynx. Cardiovascular- S1, S2 normal, pulses palpable throughout   Lungs- chest clear, no wheezing,  rales, normal symmetric air entry Abdomen- soft, non-tender; bowel sounds normal; no masses,  no organomegaly Extremities- no edema Lymph-no adenopathy palpable Musculoskeletal-no joint tenderness, deformity or swelling Skin-warm and dry, no hyperpigmentation, vitiligo, or suspicious lesions  Neurological Examination Mental Status: Patient does not respond to verbal stimuli.  Does not respond to deep sternal rub.  Does not follow commands.  No verbalizations noted.  Cranial Nerves: II: patient does not respond confrontation bilaterally, pupils right 3 mm, left 3 mm,and reactive bilaterally III,IV,VI: doll's response absent bilaterally.  Eyes deviated downward. V,VII: corneal reflex absent bilaterally  VIII: patient does not respond to verbal stimuli IX,X: gag reflex reduced, XI: trapezius strength unable to test bilaterally XII: tongue strength unable to test Motor: Extremities flaccid throughout.  No spontaneous movement noted.  No purposeful movements noted. Sensory: Does not respond to noxious stimuli in any extremity. Deep Tendon Reflexes:  2+ throughout with sustained right ankle clonus and absent left AJ Plantars: mute bilaterally Cerebellar: Unable to perform   Laboratory Studies:   Basic Metabolic Panel:  Recent Labs Lab 06/03/16 1416 06/04/16 0603  NA 135 140  K 4.3 3.5  CL 112* 117*  CO2 17* 15*  GLUCOSE 95 85  BUN 9 14  CREATININE 0.40* 0.57*  CALCIUM 8.9 8.8*  MG 1.8 1.8  PHOS  --  3.3    Liver Function Tests:  Recent Labs Lab 06/03/16 1416  AST 67*  ALT 27  ALKPHOS 72  BILITOT 4.7*  PROT 7.6  ALBUMIN 2.3*   No results for input(s): LIPASE, AMYLASE in the last 168 hours. No results for input(s): AMMONIA in the last 168 hours.  CBC:  Recent Labs Lab 06/03/16 1416 06/04/16 0603  WBC 6.6 6.7  NEUTROABS 3.9  --   HGB 10.2* 9.8*  HCT 30.8* 29.0*  MCV 88.8 88.2  PLT 173 163    Cardiac Enzymes:  Recent Labs Lab 06/03/16 1416   TROPONINI <0.03    BNP: Invalid input(s): POCBNP  CBG: No results for input(s): GLUCAP in the last 168 hours.  Microbiology: Results for orders placed or performed during the hospital encounter of 06/03/16  MRSA PCR Screening     Status: None   Collection Time: 06/03/16  4:36 PM  Result Value Ref Range Status   MRSA by PCR NEGATIVE NEGATIVE Final    Comment:        The GeneXpert MRSA Assay (FDA approved for NASAL specimens only), is one component of a comprehensive MRSA colonization surveillance program. It is not intended to diagnose MRSA infection nor to guide or monitor treatment for MRSA infections.     Coagulation Studies: No results for input(s): LABPROT, INR in the last 72 hours.  Urinalysis: No  results for input(s): COLORURINE, LABSPEC, PHURINE, GLUCOSEU, HGBUR, BILIRUBINUR, KETONESUR, PROTEINUR, UROBILINOGEN, NITRITE, LEUKOCYTESUR in the last 168 hours.  Invalid input(s): APPERANCEUR  Lipid Panel:     Component Value Date/Time   CHOL 59 05/18/2016 0249   TRIG 48 05/18/2016 0249   HDL <10* 05/18/2016 0249   CHOLHDL NOT CALCULATED 05/18/2016 0249   VLDL 10 05/18/2016 0249   LDLCALC NOT CALCULATED 05/18/2016 0249    HgbA1C:  Lab Results  Component Value Date   HGBA1C 5.2 05/18/2016    Urine Drug Screen:     Component Value Date/Time   LABOPIA NONE DETECTED 06/03/2016 1443   LABOPIA NONE DETECTED 05/17/2016 0232   COCAINSCRNUR NONE DETECTED 06/03/2016 1443   COCAINSCRNUR NONE DETECTED 05/17/2016 0232   LABBENZ NONE DETECTED 06/03/2016 1443   LABBENZ POSITIVE* 05/17/2016 0232   AMPHETMU NONE DETECTED 06/03/2016 1443   AMPHETMU NONE DETECTED 05/17/2016 0232   THCU NONE DETECTED 06/03/2016 1443   THCU NONE DETECTED 05/17/2016 0232   LABBARB NONE DETECTED 06/03/2016 1443   LABBARB NONE DETECTED 05/17/2016 0232    Alcohol Level: No results for input(s): ETH in the last 168 hours.   Imaging: Ct Head Wo Contrast  06/03/2016  CLINICAL DATA:   Unresponsive, seizures, hypertension, cirrhosis EXAM: CT HEAD WITHOUT CONTRAST TECHNIQUE: Contiguous axial images were obtained from the base of the skull through the vertex without intravenous contrast. COMPARISON:  05/16/2016 FINDINGS: Generalized atrophy. Normal ventricular morphology. No midline shift or mass effect. Again identified parasagittal low-attenuation focus at the high RIGHT frontal lobe, 1.9 x 1.3 cm, appears smaller and better defined than on the previous study con suspect involving infarct. Additionally, new area of infarction identified involving the parasagittal RIGHT parietal lobe extending into the RIGHT occipital lobe with associated loss of gray-white differentiation consistent with infarct, acute to subacute in age. No intracranial hemorrhage or additional mass lesion. No extra-axial fluid collections. Small mild fluid dependently in the sphenoid sinus. Bones and remaining sinuses unremarkable. IMPRESSION: Generalized atrophy with interval decrease in size of the low-attenuation focus seen previously at the high RIGHT frontal lobe compatible with evolving subacute infarction. New areas of low attenuation and loss of gray-white differentiation in the posterior RIGHT parietal and occipital lobes compatible with acute to subacute infarction. No intracranial hemorrhage. Electronically Signed   By: Lavonia Dana M.D.   On: 06/03/2016 15:29   Dg Chest Port 1 View  06/04/2016  CLINICAL DATA:  Hypoxia EXAM: PORTABLE CHEST 1 VIEW COMPARISON:  June 03, 2016 FINDINGS: Endotracheal tube tip is 4.4 cm above the carina. Nasogastric tube tip and side port are in the stomach. No pneumothorax. The lungs are clear. Heart size is upper normal with pulmonary vascularity within normal limits. No adenopathy. No bone lesions. IMPRESSION: Tube positions as described without pneumothorax. No edema or consolidation. Stable cardiac silhouette. Electronically Signed   By: Lowella Grip III M.D.   On: 06/04/2016  07:22   Dg Chest Port 1 View  06/03/2016  CLINICAL DATA:  Altered mental status EXAM: PORTABLE CHEST 1 VIEW COMPARISON:  May 18, 2016 FINDINGS: Endotracheal tube tip is 2.8 cm above the carina. Nasogastric tube tip and side port are in the stomach. No pneumothorax. There is no edema or consolidation. Heart is borderline enlarged with pulmonary vascularity within normal limits. No adenopathy. IMPRESSION: Tube positions as described without pneumothorax. No edema or consolidation. Cardiac prominence stable. Electronically Signed   By: Lowella Grip III M.D.   On: 06/03/2016 14:49  Assessment/Plan: 55 year old male presenting with seizure activity after being found unresponsive.  Recently discharged from [facility] after treatment for seizures and ICH.  Required intubation at that time as well.  Patient now intubated and sedated.  Advised to increase Keppra to 1500 mg BID.  Head CT personally reviewed and shows improvement in high parietal lobe lesion noted on previous imaging with some new areas of hypoattenuation noted.  Magnesium normal.  Ammonia pending.  Further work up recommended.  Recent stroke work up at [facility] was unremarkable and included a MRA/V and echocardiogram.  A1c was 5.2 and LDL was uncalculatable (05/18/16).    Recommendations: 1.  Continue Keppra at 1593m q12hours 2.  EEG today 3.  MRI of the brain with and without contrast    LAlexis Goodell MD Neurology [phone]/13/2017, 11:26 AM

## 2016-06-04 NOTE — Care Management (Signed)
Patient is here from [facility] for [facility]. CSW referral placed. He is also vented. No family is currently present.

## 2016-06-04 NOTE — Progress Notes (Signed)
PULMONARY / CRITICAL CARE MEDICINE   Name: Jon Morgan MRN: [id number] DOB: August 07, 1961    ADMISSION DATE:  06/03/2016 CONSULTATION DATE: 06/03/16  REFERRING MD:  EDP  CHIEF COMPLAINT:  Acute hypoxemic respiratory failure, s/p Seizures.   DISCUSSION: 55 year old male with hypertension, liver cirrhosis, hepatic  encephalopathy required intubation in the ED due to inability to protect airway due to seizures    SUBJECTIVE:   Patient remains intubated, was hyperventilating this morning and required paralytics. Suffered GTC seizure when off propofol  VITAL SIGNS: BP 113/70 mmHg  Pulse 115  Temp(Src) 98.4 F (36.9 C) (Oral)  Resp 21  Ht  (1.854 m)  Wt 188 lb 11.4 oz (85.6 kg)  BMI 24.90 kg/m2  SpO2 96%  HEMODYNAMICS:    VENTILATOR SETTINGS: Vent Mode:  [-] PRVC FiO2 (%):  [28 %-40 %] 28 % Set Rate:  [16 bmp] 16 bmp Vt Set:  [550 mL] 550 mL PEEP:  [5 cmH20] 5 cmH20 Plateau Pressure:  [13 cmH20] 13 cmH20  INTAKE / OUTPUT: I/O last 3 completed shifts: In: 1173.5 [I.V.:868.5; NG/GT:90; IV Piggyback:215] Out: 2300 [Urine:2050; Emesis/NG output:250]  PHYSICAL EXAMINATION: General:  African-American male, intubated and on mechanical ventilation  Neuro:  Obtunded  HEENT: Atraumatic, normocephalic, pinpoint pupil, sluggishly reacting  Cardiovascular:  S1-S2, regular rate and rhythm, no MRG noted  Lungs:Coarse, no wheezes crackles or rhonchi noted Abdomen:  soft, nontender, active bowel sounds Musculoskeletal:  no obvious deformity/inflammation noted Skin:  grossly intact  LABS:  BMET  Recent Labs Lab 06/03/16 1416 06/04/16 0603  NA 135 140  K 4.3 3.5  CL 112* 117*  CO2 17* 15*  BUN 9 14  CREATININE 0.40* 0.57*  GLUCOSE 95 85    Electrolytes  Recent Labs Lab 06/03/16 1416 06/04/16 0603 06/04/16 1126  CALCIUM 8.9 8.8*  --   MG 1.8 1.8 1.8  PHOS  --  3.3 3.5    CBC  Recent Labs Lab 06/03/16 1416 06/04/16 0603  WBC 6.6 6.7  HGB 10.2* 9.8*   HCT 30.8* 29.0*  PLT 173 163    Coag's No results for input(s): APTT, INR in the last 168 hours.  Sepsis Markers No results for input(s): LATICACIDVEN, PROCALCITON, O2SATVEN in the last 168 hours.  ABG  Recent Labs Lab 06/03/16 1525 06/04/16 0445  PHART 7.48* 7.62*  PCO2ART 24* <19*  PO2ART 130* 148*    Liver Enzymes  Recent Labs Lab 06/03/16 1416  AST 67*  ALT 27  ALKPHOS 72  BILITOT 4.7*  ALBUMIN 2.3*    Cardiac Enzymes  Recent Labs Lab 06/03/16 1416  TROPONINI <0.03    Glucose  Recent Labs Lab 06/04/16 1233  GLUCAP 86    Imaging Ct Head Wo Contrast  06/03/2016  CLINICAL DATA:  Unresponsive, seizures, hypertension, cirrhosis EXAM: CT HEAD WITHOUT CONTRAST TECHNIQUE: Contiguous axial images were obtained from the base of the skull through the vertex without intravenous contrast. COMPARISON:  05/16/2016 FINDINGS: Generalized atrophy. Normal ventricular morphology. No midline shift or mass effect. Again identified parasagittal low-attenuation focus at the high RIGHT frontal lobe, 1.9 x 1.3 cm, appears smaller and better defined than on the previous study con suspect involving infarct. Additionally, new area of infarction identified involving the parasagittal RIGHT parietal lobe extending into the RIGHT occipital lobe with associated loss of gray-white differentiation consistent with infarct, acute to subacute in age. No intracranial hemorrhage or additional mass lesion. No extra-axial fluid collections. Small mild fluid dependently in the sphenoid sinus. Bones and remaining  sinuses unremarkable. IMPRESSION: Generalized atrophy with interval decrease in size of the low-attenuation focus seen previously at the high RIGHT frontal lobe compatible with evolving subacute infarction. New areas of low attenuation and loss of gray-white differentiation in the posterior RIGHT parietal and occipital lobes compatible with acute to subacute infarction. No intracranial  hemorrhage. Electronically Signed   By: Ulyses Southward M.D.   On: 06/03/2016 15:29   Dg Chest 1v Repeat Same Day  06/04/2016  CLINICAL DATA:  Central line placement EXAM: CHEST - 1 VIEW SAME DAY COMPARISON:  06/04/2016 FINDINGS: Cardiomediastinal silhouette is stable. Stable endotracheal and NG tube position. No acute infiltrate or pulmonary edema. Moderate gas noted within stomach. There is left IJ central line with tip in SVC. No pneumothorax. IMPRESSION: Stable endotracheal and NG tube position. Left IJ central line in place. No pneumothorax. Electronically Signed   By: Natasha Mead M.D.   On: 06/04/2016 12:47   Dg Chest Port 1 View  06/04/2016  CLINICAL DATA:  Hypoxia EXAM: PORTABLE CHEST 1 VIEW COMPARISON:  June 03, 2016 FINDINGS: Endotracheal tube tip is 4.4 cm above the carina. Nasogastric tube tip and side port are in the stomach. No pneumothorax. The lungs are clear. Heart size is upper normal with pulmonary vascularity within normal limits. No adenopathy. No bone lesions. IMPRESSION: Tube positions as described without pneumothorax. No edema or consolidation. Stable cardiac silhouette. Electronically Signed   By: Bretta Bang III M.D.   On: 06/04/2016 07:22   Dg Chest Port 1 View  06/03/2016  CLINICAL DATA:  Altered mental status EXAM: PORTABLE CHEST 1 VIEW COMPARISON:  May 18, 2016 FINDINGS: Endotracheal tube tip is 2.8 cm above the carina. Nasogastric tube tip and side port are in the stomach. No pneumothorax. There is no edema or consolidation. Heart is borderline enlarged with pulmonary vascularity within normal limits. No adenopathy. IMPRESSION: Tube positions as described without pneumothorax. No edema or consolidation. Cardiac prominence stable. Electronically Signed   By: Bretta Bang III M.D.   On: 06/03/2016 14:49     STUDIES:  6/12 urine toxicology>> 6/12 CT head>>Generalized atrophy with interval decrease in size of thelow-attenuation focus seen previously at the high RIGHT  frontal lobecompatible with evolving subacute infarction.New areas of low attenuation and loss of gray-white differentiationin the posterior RIGHT parietal and occipital lobes compatible withacute to subacute infarction. No intracranial hemorrhage. CULTURES: None  ANTIBIOTICS: none  SIGNIFICANT EVENTS: 6/12 acute hypoxemic/hypercarbic respiratory failure status post seizure  LINES/TUBES: 6/12 ET tube>> 6/13 left IJ CVL>>  DISCUSSION: 55 year old male with hypertension, liver cirrhosis, hepatic  encephalopathy required intubation in the ED due to inability to protect airway due to seizures  PULMONARY A: Acute hypoxemic respiratory failure P:   Full vent support   wean as tolerated VAP prevention measures Spontaneous breathing trials in a.m. if stable Chest x-ray in a.m. Routine ABGs Propofol gtt. for sedation  CARDIOVASCULAR A:  History of hypertension  Continuous telemetry monitoring hemodynamics    hold Nadolol( home med)  RENAL A:   Hypoalbuminemia  P:   Replace electrolytes per ICU protocol Follow chemistry   GASTROINTESTINAL A:   History of liver cirrhosis Hepatic encephalopathy Elevated ammonia P:   Nothing by mouth now Pepcid for GI prophylaxis  followHepatic function panel  Lactulose/ rifaximin  HEMATOLOGIC A:   No active issues P:  Heparin for DVT prophylaxis SCDs  INFECTIOUS A:   No active issues ? Sepsis due to hyperventilation P:    Monitor fever curve No indication  of infection Follow procalcitonin  ENDOCRINE A:   No acute issues  P:   Sugar checks intermittently Monitor glucose on BMP  NEUROLOGIC A:   Seizures Acute encephalopathy P:    -RASS goal:0 -1 -Neurology consulted -Continue Keppra to 1500 mg per neurology -Propofol for sedation -CT head>negative for hemmorhage - follow UDS -EEG today -follow MRI of brain     Bincy Varughese,AG-ACNP Pulmonary & Critical Care    PCCM ATTENDING  ATTESTATION:  I have evaluated patient with ANP Varughese, reviewed database in its entirety and discussed care plan in detail. In addition, this patient was discussed on multidisciplinary rounds.   Important exam findings:   Major problems addressed by PCCM team: VDRF due to coma Status epilepticus Cirrhosis Primary respiratory alkalosis - likely due to hepatic coma. R/O sepsis as cause   PLAN/REC: Cont vent support - settings reviewed and/or adjusted Cont vent bundle Daily SBT if/when meets criteria EEG ordered Neuro consultation pending MRI ordered Lactulose, rifaximin ordered  CCM X 45 mins  Billy Fischeravid Simonds, MD PCCM service Mobile [phone] Pager [phone]

## 2016-06-05 ENCOUNTER — Inpatient Hospital Stay: Payer: Medicaid Other

## 2016-06-05 DIAGNOSIS — G40901 Epilepsy, unspecified, not intractable, with status epilepticus: Secondary | ICD-10-CM | POA: Insufficient documentation

## 2016-06-05 DIAGNOSIS — N179 Acute kidney failure, unspecified: Secondary | ICD-10-CM

## 2016-06-05 DIAGNOSIS — K7460 Unspecified cirrhosis of liver: Secondary | ICD-10-CM

## 2016-06-05 DIAGNOSIS — K7682 Hepatic encephalopathy: Secondary | ICD-10-CM | POA: Insufficient documentation

## 2016-06-05 DIAGNOSIS — K7290 Hepatic failure, unspecified without coma: Secondary | ICD-10-CM | POA: Insufficient documentation

## 2016-06-05 DIAGNOSIS — I639 Cerebral infarction, unspecified: Secondary | ICD-10-CM

## 2016-06-05 LAB — BASIC METABOLIC PANEL
Anion gap: 5 (ref 5–15)
Anion gap: 10 (ref 5–15)
BUN: 32 mg/dL — ABNORMAL HIGH (ref 6–20)
BUN: 39 mg/dL — ABNORMAL HIGH (ref 6–20)
CALCIUM: 9 mg/dL (ref 8.9–10.3)
CHLORIDE: 121 mmol/L — AB (ref 101–111)
CHLORIDE: 118 mmol/L — AB (ref 101–111)
CO2: 18 mmol/L — AB (ref 22–32)
CO2: 14 mmol/L — ABNORMAL LOW (ref 22–32)
CREATININE: 1.84 mg/dL — AB (ref 0.61–1.24)
Calcium: 8.9 mg/dL (ref 8.9–10.3)
Creatinine, Ser: 2.27 mg/dL — ABNORMAL HIGH (ref 0.61–1.24)
GFR calc Af Amer: 47 mL/min — ABNORMAL LOW (ref >60)
GFR calc non Af Amer: 40 mL/min — ABNORMAL LOW (ref >60)
GFR calc non Af Amer: 31 mL/min — ABNORMAL LOW (ref >60)
GFR, EST AFRICAN AMERICAN: 36 mL/min — AB (ref >60)
GLUCOSE: 81 mg/dL (ref 65–99)
Glucose, Bld: 95 mg/dL (ref 65–99)
POTASSIUM: 4.8 mmol/L (ref 3.5–5.1)
Potassium: 5.4 mmol/L — ABNORMAL HIGH (ref 3.5–5.1)
SODIUM: 142 mmol/L (ref 135–145)
Sodium: 144 mmol/L (ref 135–145)

## 2016-06-05 LAB — GLUCOSE, CAPILLARY
GLUCOSE-CAPILLARY: 92 mg/dL (ref 65–99)
Glucose-Capillary: 80 mg/dL (ref 65–99)
Glucose-Capillary: 82 mg/dL (ref 65–99)
Glucose-Capillary: 85 mg/dL (ref 65–99)
Glucose-Capillary: 86 mg/dL (ref 65–99)
Glucose-Capillary: 96 mg/dL (ref 65–99)

## 2016-06-05 LAB — MAGNESIUM: MAGNESIUM: 1.9 mg/dL (ref 1.7–2.4)

## 2016-06-05 LAB — CBC
HEMATOCRIT: 29.1 % — AB (ref 40.0–52.0)
HEMOGLOBIN: 9.6 g/dL — AB (ref 13.0–18.0)
MCH: 30.1 pg (ref 26.0–34.0)
MCHC: 32.9 g/dL (ref 32.0–36.0)
MCV: 91.4 fL (ref 80.0–100.0)
Platelets: 164 10*3/uL (ref 150–440)
RBC: 3.18 MIL/uL — ABNORMAL LOW (ref 4.40–5.90)
RDW: 28 % — AB (ref 11.5–14.5)
WBC: 10.9 10*3/uL — ABNORMAL HIGH (ref 3.8–10.6)

## 2016-06-05 LAB — HEPATIC FUNCTION PANEL
ALBUMIN: 2.2 g/dL — AB (ref 3.5–5.0)
ALK PHOS: 52 U/L (ref 38–126)
ALT: 25 U/L (ref 17–63)
AST: 60 U/L — ABNORMAL HIGH (ref 15–41)
BILIRUBIN DIRECT: 2.8 mg/dL — AB (ref 0.1–0.5)
BILIRUBIN TOTAL: 4.8 mg/dL — AB (ref 0.3–1.2)
Indirect Bilirubin: 2 mg/dL — ABNORMAL HIGH (ref 0.3–0.9)
Total Protein: 7.1 g/dL (ref 6.5–8.1)

## 2016-06-05 LAB — AMMONIA: AMMONIA: 617 umol/L — AB (ref 9–35)

## 2016-06-05 LAB — PHOSPHORUS: PHOSPHORUS: 7 mg/dL — AB (ref 2.5–4.6)

## 2016-06-05 LAB — PROCALCITONIN: Procalcitonin: 0.24 ng/mL

## 2016-06-05 MED ORDER — LACTULOSE 10 GM/15ML PO SOLN
30.0000 g | Freq: Once | ORAL | Status: AC
  Administered 2016-06-05: 30 g
  Filled 2016-06-05: qty 60

## 2016-06-05 MED ORDER — SODIUM CHLORIDE 0.45 % IV SOLN
INTRAVENOUS | Status: DC
  Administered 2016-06-05 – 2016-06-06 (×3): via INTRAVENOUS

## 2016-06-05 MED ORDER — PRO-STAT SUGAR FREE PO LIQD
60.0000 mL | Freq: Three times a day (TID) | ORAL | Status: DC
  Administered 2016-06-05 (×2): 60 mL

## 2016-06-05 MED ORDER — SODIUM POLYSTYRENE SULFONATE 15 GM/60ML PO SUSP
60.0000 g | Freq: Once | ORAL | Status: AC
  Administered 2016-06-05: 60 g
  Filled 2016-06-05: qty 240

## 2016-06-05 MED ORDER — VITAL HIGH PROTEIN PO LIQD
1000.0000 mL | ORAL | Status: DC
  Administered 2016-06-05: 1000 mL

## 2016-06-05 MED ORDER — VECURONIUM BROMIDE 10 MG IV SOLR
INTRAVENOUS | Status: AC
  Filled 2016-06-05: qty 10

## 2016-06-05 MED ORDER — FAMOTIDINE 40 MG/5ML PO SUSR
20.0000 mg | Freq: Every day | ORAL | Status: DC
  Administered 2016-06-06: 20 mg
  Filled 2016-06-05 (×2): qty 2.5

## 2016-06-05 MED ORDER — STERILE WATER FOR INJECTION IJ SOLN
INTRAMUSCULAR | Status: AC
  Administered 2016-06-05: 10 mL
  Filled 2016-06-05: qty 10

## 2016-06-05 MED ORDER — NOREPINEPHRINE BITARTRATE 1 MG/ML IV SOLN
0.0000 ug/min | INTRAVENOUS | Status: DC
  Administered 2016-06-05: 26 ug/min via INTRAVENOUS
  Administered 2016-06-05: 30 ug/min via INTRAVENOUS
  Administered 2016-06-06: 20 ug/min via INTRAVENOUS
  Filled 2016-06-05 (×4): qty 16

## 2016-06-05 MED ORDER — ENOXAPARIN SODIUM 40 MG/0.4ML ~~LOC~~ SOLN
40.0000 mg | SUBCUTANEOUS | Status: DC
  Administered 2016-06-05 – 2016-06-11 (×7): 40 mg via SUBCUTANEOUS
  Filled 2016-06-05 (×7): qty 0.4

## 2016-06-05 MED ORDER — VECURONIUM BROMIDE 10 MG IV SOLR
10.0000 mg | INTRAVENOUS | Status: AC
  Administered 2016-06-05: 10 mg via INTRAVENOUS

## 2016-06-05 MED ORDER — MIDAZOLAM HCL 2 MG/2ML IJ SOLN
2.0000 mg | INTRAMUSCULAR | Status: DC | PRN
  Administered 2016-06-05 – 2016-06-08 (×6): 2 mg via INTRAVENOUS
  Filled 2016-06-05 (×6): qty 2

## 2016-06-05 MED ORDER — SODIUM CHLORIDE 0.9 % IV SOLN
100.0000 mg | Freq: Two times a day (BID) | INTRAVENOUS | Status: DC
  Administered 2016-06-05 – 2016-06-07 (×4): 100 mg via INTRAVENOUS
  Filled 2016-06-05 (×8): qty 10

## 2016-06-05 NOTE — Progress Notes (Signed)
Pt remains sedated on Ventilator. No signs of pain using the CPOT tool. Pt is tolerating tube feeds at 20 ml/ hr. Pt is on 30 mcg/hr of Levophed to maintain MAP of 75. Pt had 250 of amber colored urine. Pt remains unresponsive to sternal rub, and nailbed pressure. Pt's pupils are equal and reactive. One episode of seizure like activity today controlled with 2 mg Ativan x 1. Pt in stable condition at this time.  Report given to oncoming nurse.

## 2016-06-05 NOTE — Progress Notes (Addendum)
eLink Physician-Brief Progress Note Patient Name: Jon PatteeWayne Morgan DOB: July 02, 1961 MRN: [id number]   Date of Service  06/05/2016  HPI/Events of Note  Multiple issues - 1. Patient is seizing - Already on Vimpat, Keppra, Versed IV infusion and Propofol IV infusion. 2. Ammonia = 617. Already on Lactulose BID and Rifaximin.  eICU Interventions  Will order:  1. Versed 2 mg IV Q 1 hour PRN seizures. 2. Call Neurology for further anticonvulsant Rx.  3. Lactulose 30 gm via enteral tube now. (Extra Dose)     Intervention Category Major Interventions: Seizures - evaluation and management  Sommer,Steven Eugene 06/05/2016, 6:27 AM

## 2016-06-05 NOTE — Progress Notes (Signed)
Notified dr. Arsenio LoaderSommer of continued seizure activity after ativan, and ammonia level, orders received

## 2016-06-05 NOTE — Progress Notes (Signed)
PULMONARY / CRITICAL CARE MEDICINE   Name: Jon Morgan MRN: [id number] DOB: 11/08/1961    ADMISSION DATE:  06/03/2016  PT PROFILE: [age] M with h/o cirrhosis, CVAs seizure d/o presented to ED with status epilepticus and intubated in ED. Admitted by PCCM service  MAJOR EVENTS/TEST RESULTS: 06/12 Admitted via ED with status epilepticus, VDRF 06/12 CT head: Generalized atrophy with interval decrease in size of the low-attenuation focus seen previously at the high RIGHT frontal lobe compatible with evolving subacute infarction. New areas of low attenuation and loss of gray-white differentiation in the posterior RIGHT parietal and occipital lobes compatible with acute to subacute infarction. 06/13 Neuro consultation: recommended increased dose of Keppra. EEG ordered. MRI ordered 06/13 ammonia level > 500. Lactulose, rifaximin initiated 06/13 EEG: This is an abnormal EEG secondary to general background slowing. This finding may be seen with a diffuse disturbance that is etiologically nonspecific, but is consistent with the patient's current medications and may be related to the patient being post-ictal as well. No epileptiform activity is noted 06/13 MRI: New extensive cortically based restricted diffusion throughout both cerebral hemispheres which may reflect anoxic injury or the sequelae of recent seizure activity. Creutzfeldt-Jakob disease was considered but is felt unlikely given the clinical presentation and rapid development of these findings. New edema in the posteromedial right temporal and right occipital lobes suspicious for subacute PCA territory infarct. Slightly decreased size of subacute right frontoparietal hematoma 06/13 Pt's [facility]. Awaiting bed availability 06/13 Further seizure despite Keppra, propofol. Midazolam infusion initiated and Vimpat added 06/14 repeat EEG:   INDWELLING DEVICES:: ETT 06/12 >>  L IJ CVL 06/13 >>    MICRO DATA: MRSA PCR 06/12 >> NEG  ANTIMICROBIALS:     SUBJECTIVE:  RASS -5 on high dose sedatives  VITAL SIGNS: BP 90/51 mmHg  Pulse 77  Temp(Src) 97 F (36.1 C) (Oral)  Resp 23  Ht 6\' 1"  (1.854 m)  Wt 190 lb 4.1 oz (86.3 kg)  BMI 25.11 kg/m2  SpO2 96%  HEMODYNAMICS:    VENTILATOR SETTINGS: Vent Mode:  [-] PRVC FiO2 (%):  [28 %-40 %] 40 % Set Rate:  [16 bmp] 16 bmp Vt Set:  [550 mL] 550 mL PEEP:  [5 cmH20] 5 cmH20 Plateau Pressure:  [16 cmH20] 16 cmH20  INTAKE / OUTPUT: I/O last 3 completed shifts: In: 2832.4 [I.V.:1747.4; NG/GT:610; IV Piggyback:475] Out: 2475 [Urine:1275; Emesis/NG output:1200]  PHYSICAL EXAMINATION: General: comatose Neuro: pupils react, no spont movement, no withdrawal from pain  HEENT: NCAT, sclericterus  Cardiovascular: regular, no M Lungs:Coarse, no wheezes  Abdomen:  soft, nondistended, +BS Ext: warm, no edema  LABS:  BMET  Recent Labs Lab 06/03/16 1416 06/04/16 0603 06/05/16 0504  NA 135 140 144  K 4.3 3.5 5.4*  CL 112* 117* 121*  CO2 17* 15* 18*  BUN 9 14 32*  CREATININE 0.40* 0.57* 1.84*  GLUCOSE 95 85 81    Electrolytes  Recent Labs Lab 06/03/16 1416  06/04/16 0603 06/04/16 1126 06/04/16 2020 06/05/16 0504  CALCIUM 8.9  --  8.8*  --   --  9.0  MG 1.8  --  1.8 1.8 2.0 1.9  PHOS  --   < > 3.3 3.5 5.7* 7.0*  < > = values in this interval not displayed.  CBC  Recent Labs Lab 06/03/16 1416 06/04/16 0603 06/05/16 0504  WBC 6.6 6.7 10.9*  HGB 10.2* 9.8* 9.6*  HCT 30.8* 29.0* 29.1*  PLT  173 163 164    Coag's No results for input(s): APTT, INR in the last 168 hours.  Sepsis Markers  Recent Labs Lab 06/04/16 1126 06/05/16 0504  PROCALCITON <0.10 0.24    ABG  Recent Labs Lab 06/03/16 1525 06/04/16 0445 06/04/16 1554  PHART 7.48* 7.62* 7.42  PCO2ART 24* <19* 25*  PO2ART 130* 148* 106    Liver Enzymes  Recent Labs Lab 06/03/16 1416 06/05/16 0504  AST 67* 60*  ALT 27 25   ALKPHOS 72 52  BILITOT 4.7* 4.8*  ALBUMIN 2.3* 2.2*    Cardiac Enzymes  Recent Labs Lab 06/03/16 1416  TROPONINI <0.03    Glucose  Recent Labs Lab 06/04/16 1233 06/04/16 1812 06/04/16 2000 06/04/16 2324 06/05/16 0012 06/05/16 0740  GLUCAP 86 84 92 89 82 86    CXR: NACPD   IMPRESSION/PLAN: NEUROLOGIC A:   Status epilepticus Hepatic encephalopathy 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 Coma P:   Anti-convulsant therapies per Neurology Repeat EEG today No WUA until out of status  PULMONARY A: VDRF due to AMS Primary respiratory alkalosis due to hepatic encephalopathy P:   Cont full vent support - settings reviewed and/or adjusted Cont vent bundle Daily SBT if/when meets criteria  CARDIOVASCULAR A:  History of hypertension P: Monitor - MAP goal > 75 mmHg Continue norepinephrine infusion  RENAL A:   AKI, oliguric - concern for HRS P:   Monitor BMET intermittently Monitor I/Os Correct electrolytes as indicated Very poor candidate for HD of any duration  GASTROINTESTINAL A:   Severe cirrhosis Markedly elevated ammonia P:   SUP: enteral famotidine Initiate TF protocol  HEMATOLOGIC A:   Anemia without acute blood loss 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

## 2016-06-05 NOTE — Progress Notes (Signed)
Nutrition Follow-up  DOCUMENTATION CODES:   Not applicable  INTERVENTION:  -Discussed nutritional poc with MD Simonds during ICU rounds; MD ok with initiation of TF today. Recommend Vital High Protein at 20 ml/hr with Prostat 6 packets daily at present providing 1080 kcals, 132 g of protein today with no orders for titration. Additional calories from diprivan. Reassess on follow  NUTRITION DIAGNOSIS:   Inadequate oral intake related to acute illness as evidenced by NPO status.  Being addressed via TF  GOAL:   Provide needs based on ASPEN/SCCM guidelines  MONITOR:   Vent status, TF tolerance, Weight trends, Labs  REASON FOR ASSESSMENT:   Ventilator, Consult Enteral/tube feeding initiation and management  ASSESSMENT:   55 yo male admitted with acute respiratory failure requiring intubation on 06/03/16, hepatic encephalopathy, seizures. [facility] after admission for acute respiratory failure s/p seizures requiring intubation  Pt continues with seizure activity, worsening ammonia level, oliguric renal failure with hyperkalemia and hyperphosphatemia concerning for hepatorenal syndrome Patient is currently intubated on ventilator support MV: 12.6 L/min Temp (24hrs), Avg:97 F (36.1 C), Min:93.6 F (34.2 C), Max:99.5 F (37.5 C)  Propofol: 15.4 ml/hr (407 kcals) ' Tube Feeding not started yesterday due to blood in stool, seizures per Lauris PoagAmelia RN. TF order remains active  Diet Order:   NPO  Skin:   (stage III sacrum, deep tissue injury heel)  Last BM:  06/03/16   Labs: potassium 5.4, phosphorus 7.0, Creatinine 1.84, ammonia 617, Hgb 9.6 (stable)  Meds: 1/2 NS at 100 ml/hr, diprivan, kayexalate, lactulose, levophed, xifaxan  Height:   Ht Readings from Last 1 Encounters:  06/03/16 6\' 1"  (1.854 m)    Weight:   Wt Readings from Last 1 Encounters:  06/05/16 190 lb 4.1 oz (86.3 kg)    Filed Weights   06/03/16 1945 06/04/16 0346  06/05/16 0500  Weight: 187 lb 13.3 oz (85.2 kg) 188 lb 11.4 oz (85.6 kg) 190 lb 4.1 oz (86.3 kg)    BMI:  Body mass index is 25.11 kg/(m^2).  Estimated Nutritional Needs:   Kcal:  2163 kcals  Protein:  129-172 g   Fluid:  >/= 2 L  EDUCATION NEEDS:   No education needs identified at this time  Romelle StarcherCate France Lusty MS, RD, LDN [phone] Pager  [phone]) [phone] Weekend/On-Call Pager

## 2016-06-05 NOTE — Progress Notes (Addendum)
Subjective: Patient remains intubated and sedated. Attempt made to decrease sedation on yesterday.  Patient with seizure activity at that time.  Required multiple doses of benzodiazepines.  Given Vecuronium as well with last dose at 0143.  Now on Keppra and Vimpat.  No clinical seizure activity noted at this time.  Remains on Propofol and Versed.    Objective: Current vital signs: BP 101/55 mmHg  Pulse 31  Temp(Src) 98.8 F (37.1 C) (Oral)  Resp 17  Ht '6\' 1"'  (1.854 m)  Wt 86.3 kg (190 lb 4.1 oz)  BMI 25.11 kg/m2  SpO2 98% Vital signs in last 24 hours: Temp:  [93.6 F (34.2 C)-99.5 F (37.5 C)] 98.8 F (37.1 C) (06/14 0600) Pulse Rate:  [30-122] 31 (06/14 0600) Resp:  [15-31] 17 (06/14 0600) BP: (77-131)/(46-78) 101/55 mmHg (06/14 0600) SpO2:  [85 %-100 %] 98 % (06/14 0808) FiO2 (%):  [28 %-40 %] 40 % (06/14 0808) Weight:  [86.3 kg (190 lb 4.1 oz)] 86.3 kg (190 lb 4.1 oz) (06/14 0500)  Intake/Output from previous day: 06/13 0701 - 06/14 0700 In: 1668.1 [I.V.:888.1; NG/GT:520; IV Piggyback:260] Out: 1325 [Urine:375; Emesis/NG output:950] Intake/Output this shift:   Nutritional status:    Neurologic Exam: Mental Status: Patient does not respond to verbal stimuli. Does not respond to deep sternal rub. Does not follow commands. No verbalizations noted.  Cranial Nerves: II: patient does not respond confrontation bilaterally, pupils right 4 mm, left 4 mm,and unreactive bilaterally III,IV,VI: doll's response absent bilaterally. Eyes deviated downward. V,VII: corneal reflex absent bilaterally  VIII: patient does not respond to verbal stimuli IX,X: gag reflex reduced, XI: trapezius strength unable to test bilaterally XII: tongue strength unable to test Motor: Extremities flaccid throughout. No spontaneous movement noted. No purposeful movements noted. Sensory: Does not respond to noxious stimuli in any extremity.   Lab Results: Basic Metabolic Panel:  Recent  Labs Lab 06/03/16 1416 06/04/16 0603 06/04/16 1126 06/04/16 2020 06/05/16 0504  NA 135 140  --   --  144  K 4.3 3.5  --   --  5.4*  CL 112* 117*  --   --  121*  CO2 17* 15*  --   --  18*  GLUCOSE 95 85  --   --  81  BUN 9 14  --   --  32*  CREATININE 0.40* 0.57*  --   --  1.84*  CALCIUM 8.9 8.8*  --   --  9.0  MG 1.8 1.8 1.8 2.0 1.9  PHOS  --  3.3 3.5 5.7* 7.0*    Liver Function Tests:  Recent Labs Lab 06/03/16 1416 06/05/16 0504  AST 67* 60*  ALT 27 25  ALKPHOS 72 52  BILITOT 4.7* 4.8*  PROT 7.6 7.1  ALBUMIN 2.3* 2.2*   No results for input(s): LIPASE, AMYLASE in the last 168 hours.  Recent Labs Lab 06/04/16 1126 06/05/16 0506  AMMONIA 518* 617*    CBC:  Recent Labs Lab 06/03/16 1416 06/04/16 0603 06/05/16 0504  WBC 6.6 6.7 10.9*  NEUTROABS 3.9  --   --   HGB 10.2* 9.8* 9.6*  HCT 30.8* 29.0* 29.1*  MCV 88.8 88.2 91.4  PLT 173 163 164    Cardiac Enzymes:  Recent Labs Lab 06/03/16 1416  TROPONINI <0.03    Lipid Panel:  Recent Labs Lab 06/04/16 1126  TRIG 58    CBG:  Recent Labs Lab 06/04/16 1812 06/04/16 2000 06/04/16 2324 06/05/16 0012 06/05/16 0740  GLUCAP 84 92  20 82 54    Microbiology: Results for orders placed or performed during the hospital encounter of 06/03/16  MRSA PCR Screening     Status: None   Collection Time: 06/03/16  4:36 PM  Result Value Ref Range Status   MRSA by PCR NEGATIVE NEGATIVE Final    Comment:        The GeneXpert MRSA Assay (FDA approved for NASAL specimens only), is one component of a comprehensive MRSA colonization surveillance program. It is not intended to diagnose MRSA infection nor to guide or monitor treatment for MRSA infections.     Coagulation Studies: No results for input(s): LABPROT, INR in the last 72 hours.  Imaging: Ct Head Wo Contrast  06/03/2016  CLINICAL DATA:  Unresponsive, seizures, hypertension, cirrhosis EXAM: CT HEAD WITHOUT CONTRAST TECHNIQUE: Contiguous  axial images were obtained from the base of the skull through the vertex without intravenous contrast. COMPARISON:  05/16/2016 FINDINGS: Generalized atrophy. Normal ventricular morphology. No midline shift or mass effect. Again identified parasagittal low-attenuation focus at the high RIGHT frontal lobe, 1.9 x 1.3 cm, appears smaller and better defined than on the previous study con suspect involving infarct. Additionally, new area of infarction identified involving the parasagittal RIGHT parietal lobe extending into the RIGHT occipital lobe with associated loss of gray-white differentiation consistent with infarct, acute to subacute in age. No intracranial hemorrhage or additional mass lesion. No extra-axial fluid collections. Small mild fluid dependently in the sphenoid sinus. Bones and remaining sinuses unremarkable. IMPRESSION: Generalized atrophy with interval decrease in size of the low-attenuation focus seen previously at the high RIGHT frontal lobe compatible with evolving subacute infarction. New areas of low attenuation and loss of gray-white differentiation in the posterior RIGHT parietal and occipital lobes compatible with acute to subacute infarction. No intracranial hemorrhage. Electronically Signed   By: Lavonia Dana M.D.   On: 06/03/2016 15:29   Mr Jeri Cos WS Contrast  06/04/2016  CLINICAL DATA:  Seizures. EXAM: MRI HEAD WITHOUT AND WITH CONTRAST TECHNIQUE: Multiplanar, multiecho pulse sequences of the brain and surrounding structures were obtained without and with intravenous contrast. CONTRAST:  50m MULTIHANCE GADOBENATE DIMEGLUMINE 529 MG/ML IV SOLN COMPARISON:  Head CT 06/03/2016 and MRI 05/17/2016 FINDINGS: Right frontoparietal parasagittal lesion has slightly decreased in size, measuring 2.3 x 1.4 cm. The lesion again demonstrates T2 hyperintensity with a T2 hypointense rim, mixed T1 signal intensity, and peripheral susceptibility artifact most consistent with evolving subacute hematoma.  There is no significant associated enhancement or surrounding edema. Intrinsic T1 hyperintensity in the globus pallidus bilaterally is again noted and is consistent with known chronic liver disease. There is a chronic microhemorrhage in the left putamen which is unchanged. No midline shift or extra-axial fluid collection is seen. Cerebral volume is unchanged. No ventriculomegaly. There are multiple new areas of extensive cortically based restricted diffusion throughout both cerebral hemispheres. There is greatest involvement of the parietal and posterior temporal lobes, right greater than left. Bilateral frontal lobe involvement is greatest medially. There is also diffuse involvement of the insula bilaterally. Mild diffusion abnormality is present in the dorsomedial thalami. There is new T2 hyperintensity/ edema within the posterior right temporal lobe including the need parahippocampal gyrus which is contiguous with edema in the anteromedial right occipital lobe. There is gyral swelling without restricted diffusion. There is suggestion of subtle gyriform enhancement in the medial right occipital lobe. Orbits are unremarkable. Small volume fluid in the left sphenoid sinus. Mild mucosal thickening and small retention cyst in the left maxillary  sinus. Clear mastoid air cells. Major intracranial vascular flow voids are preserved. IMPRESSION: 1. New extensive cortically based restricted diffusion throughout both cerebral hemispheres which may reflect anoxic injury or the sequelae of recent seizure activity. Creutzfeldt-Jakob disease was considered but is felt unlikely given the clinical presentation and rapid development of these findings. 2. New edema in the posteromedial right temporal and right occipital lobes suspicious for subacute PCA territory infarct. 3. Slightly decreased size of subacute right frontoparietal hematoma. Electronically Signed   By: Logan Bores M.D.   On: 06/04/2016 16:35   Dg Chest 1v Repeat  Same Day  06/04/2016  CLINICAL DATA:  Central line placement EXAM: CHEST - 1 VIEW SAME DAY COMPARISON:  06/04/2016 FINDINGS: Cardiomediastinal silhouette is stable. Stable endotracheal and NG tube position. No acute infiltrate or pulmonary edema. Moderate gas noted within stomach. There is left IJ central line with tip in SVC. No pneumothorax. IMPRESSION: Stable endotracheal and NG tube position. Left IJ central line in place. No pneumothorax. Electronically Signed   By: Lahoma Crocker M.D.   On: 06/04/2016 12:47   Dg Chest Port 1 View  06/05/2016  CLINICAL DATA:  Respiratory failure. EXAM: PORTABLE CHEST 1 VIEW COMPARISON:  06/04/2016. FINDINGS: Endotracheal tube, left IJ line, NG tube in stable position. No gastric distention noted on today's exam . Mediastinum hilar structures are normal. Cardiomegaly with normal pulmonary vascularity. Left base subsegmental atelectasis. No pleural effusion or pneumothorax. IMPRESSION: 1. Lines and tubes in stable position. Interim resolution of gastric distention. 2. Left base subsegmental atelectasis and or infiltrates. Electronically Signed   By: Marcello Moores  Register   On: 06/05/2016 07:22   Dg Chest Port 1 View  06/04/2016  CLINICAL DATA:  Hypoxia EXAM: PORTABLE CHEST 1 VIEW COMPARISON:  June 03, 2016 FINDINGS: Endotracheal tube tip is 4.4 cm above the carina. Nasogastric tube tip and side port are in the stomach. No pneumothorax. The lungs are clear. Heart size is upper normal with pulmonary vascularity within normal limits. No adenopathy. No bone lesions. IMPRESSION: Tube positions as described without pneumothorax. No edema or consolidation. Stable cardiac silhouette. Electronically Signed   By: Lowella Grip III M.D.   On: 06/04/2016 07:22   Dg Chest Port 1 View  06/03/2016  CLINICAL DATA:  Altered mental status EXAM: PORTABLE CHEST 1 VIEW COMPARISON:  May 18, 2016 FINDINGS: Endotracheal tube tip is 2.8 cm above the carina. Nasogastric tube tip and side port are in  the stomach. No pneumothorax. There is no edema or consolidation. Heart is borderline enlarged with pulmonary vascularity within normal limits. No adenopathy. IMPRESSION: Tube positions as described without pneumothorax. No edema or consolidation. Cardiac prominence stable. Electronically Signed   By: Lowella Grip III M.D.   On: 06/03/2016 14:49    Medications:  I have reviewed the patient's current medications. Scheduled: . antiseptic oral rinse  7 mL Mouth Rinse QID  . chlorhexidine gluconate (SAGE KIT)  15 mL Mouth Rinse BID  . famotidine  20 mg Per Tube BID  . feeding supplement (PRO-STAT SUGAR FREE 64)  30 mL Per Tube BID  . free water  200 mL Per Tube Q8H  . lacosamide (VIMPAT) IV  50 mg Intravenous Q12H  . lactulose  30 g Per Tube BID  . levETIRAcetam  1,500 mg Intravenous Q12H  . rifaximin  550 mg Per Tube TID  . sodium chloride  250 mL Intravenous Once   Continuous: . sodium chloride 100 mL/hr at 06/05/16 1031  . feeding supplement (  VITAL HIGH PROTEIN)    . midazolam (VERSED) infusion 10 mg/hr (06/05/16 0737)  . norepinephrine 10 mcg/min (06/05/16 0959)  . propofol (DIPRIVAN) infusion 31 mcg/kg/min (06/05/16 8592)    Assessment/Plan: Once Propofol decreased patient with return of seizure activity.  Difficult to control on yesterday.  Currently on Propofol, Versed, Vimpat and Keppra.  No clinical seizure activity noted.  Based on activity of yesterday patient would be best served by continuous monitoring.  Patient's son is requesting transfer to [facility] and currently bed is not available.  He has refused transfer to [facility] at this time.   MRI of the brain personally reviewed and shows cortically based restricted diffusion bilaterally representing changes related to seizure activity versus anoxic injury.  Also noted is right temporal and occipital lobe edema concerning for infarct and improvement in previously noted hematoma.   Ammonia markedly elevated.  Patient now with signs of  renal injury as well.    Recommendations: 1.  Increase Vimpat to 135m q12 2.  Repeat EEG today  Case discussed with Dr. SAlva Garnet  LOS: 2 days   LAlexis Goodell MD Neurology [phone]/14/2017  10:35 AM

## 2016-06-05 NOTE — Clinical Social Work Note (Signed)
Patient currently continues to have seizure activity and neurology has documented that patient's son has [facility] and [facility]. CSW contacted patient's son and left message. York SpanielMonica Mathilda Maguire MSW,LCSW [phone]

## 2016-06-05 NOTE — Progress Notes (Signed)
Dr. Belia HemanKasa notified of low UOP, no orders received

## 2016-06-06 ENCOUNTER — Inpatient Hospital Stay: Payer: Medicaid Other

## 2016-06-06 LAB — BASIC METABOLIC PANEL WITH GFR
Anion gap: 10 (ref 5–15)
BUN: 44 mg/dL — ABNORMAL HIGH (ref 6–20)
CO2: 13 mmol/L — ABNORMAL LOW (ref 22–32)
Calcium: 8.6 mg/dL — ABNORMAL LOW (ref 8.9–10.3)
Chloride: 121 mmol/L — ABNORMAL HIGH (ref 101–111)
Creatinine, Ser: 1.82 mg/dL — ABNORMAL HIGH (ref 0.61–1.24)
GFR calc Af Amer: 47 mL/min — ABNORMAL LOW
GFR calc non Af Amer: 41 mL/min — ABNORMAL LOW
Glucose, Bld: 107 mg/dL — ABNORMAL HIGH (ref 65–99)
Potassium: 3.4 mmol/L — ABNORMAL LOW (ref 3.5–5.1)
Sodium: 144 mmol/L (ref 135–145)

## 2016-06-06 LAB — CBC
HCT: 27.3 % — ABNORMAL LOW (ref 40.0–52.0)
Hemoglobin: 9 g/dL — ABNORMAL LOW (ref 13.0–18.0)
MCH: 30.4 pg (ref 26.0–34.0)
MCHC: 33.1 g/dL (ref 32.0–36.0)
MCV: 92 fL (ref 80.0–100.0)
PLATELETS: 142 10*3/uL — AB (ref 150–440)
RBC: 2.97 MIL/uL — ABNORMAL LOW (ref 4.40–5.90)
RDW: 27.5 % — AB (ref 11.5–14.5)
WBC: 10.2 10*3/uL (ref 3.8–10.6)

## 2016-06-06 LAB — BLOOD GAS, ARTERIAL
Acid-base deficit: 5.5 mmol/L — ABNORMAL HIGH (ref 0.0–2.0)
Bicarbonate: 17.9 meq/L — ABNORMAL LOW (ref 21.0–28.0)
FIO2: 0.35
MECHVT: 550 mL
Mechanical Rate: 16
O2 Saturation: 94.7 %
PEEP: 5 cmH2O
Patient temperature: 37
pCO2 arterial: 27 mmHg — ABNORMAL LOW (ref 32.0–48.0)
pH, Arterial: 7.43 (ref 7.350–7.450)
pO2, Arterial: 72 mmHg — ABNORMAL LOW (ref 83.0–108.0)

## 2016-06-06 LAB — HEPATIC FUNCTION PANEL
ALK PHOS: 49 U/L (ref 38–126)
ALT: 25 U/L (ref 17–63)
AST: 59 U/L — AB (ref 15–41)
Albumin: 2.1 g/dL — ABNORMAL LOW (ref 3.5–5.0)
BILIRUBIN DIRECT: 2.6 mg/dL — AB (ref 0.1–0.5)
BILIRUBIN TOTAL: 4.4 mg/dL — AB (ref 0.3–1.2)
Indirect Bilirubin: 1.8 mg/dL — ABNORMAL HIGH (ref 0.3–0.9)
Total Protein: 7.1 g/dL (ref 6.5–8.1)

## 2016-06-06 LAB — GLUCOSE, CAPILLARY
GLUCOSE-CAPILLARY: 107 mg/dL — AB (ref 65–99)
Glucose-Capillary: 103 mg/dL — ABNORMAL HIGH (ref 65–99)
Glucose-Capillary: 108 mg/dL — ABNORMAL HIGH (ref 65–99)
Glucose-Capillary: 109 mg/dL — ABNORMAL HIGH (ref 65–99)
Glucose-Capillary: 98 mg/dL (ref 65–99)

## 2016-06-06 LAB — AMMONIA: Ammonia: 210 umol/L — ABNORMAL HIGH (ref 9–35)

## 2016-06-06 LAB — PROCALCITONIN: Procalcitonin: 0.27 ng/mL

## 2016-06-06 MED ORDER — POTASSIUM CHLORIDE 2 MEQ/ML IV SOLN
INTRAVENOUS | Status: DC
  Administered 2016-06-06: 14:00:00 via INTRAVENOUS
  Filled 2016-06-06 (×4): qty 1000

## 2016-06-06 MED ORDER — NOREPINEPHRINE 4 MG/250ML-% IV SOLN
INTRAVENOUS | Status: AC
  Administered 2016-06-06: 4 mg
  Filled 2016-06-06: qty 250

## 2016-06-06 MED ORDER — PRO-STAT SUGAR FREE PO LIQD
30.0000 mL | Freq: Three times a day (TID) | ORAL | Status: DC
  Administered 2016-06-06 – 2016-06-12 (×19): 30 mL

## 2016-06-06 MED ORDER — VITAL AF 1.2 CAL PO LIQD
1000.0000 mL | ORAL | Status: DC
  Administered 2016-06-06: 1000 mL

## 2016-06-06 MED ORDER — DEXTROSE 5 % IV SOLN
INTRAVENOUS | Status: DC
  Administered 2016-06-06: 09:00:00 via INTRAVENOUS

## 2016-06-06 MED ORDER — ACETAMINOPHEN 650 MG RE SUPP
650.0000 mg | Freq: Four times a day (QID) | RECTAL | Status: DC | PRN
  Administered 2016-06-06: 650 mg via RECTAL
  Filled 2016-06-06: qty 1

## 2016-06-06 NOTE — Progress Notes (Signed)
PULMONARY / CRITICAL CARE MEDICINE   Name: Jon Morgan MRN: [id number] DOB: 1961-06-02    ADMISSION DATE:  06/03/2016  PT PROFILE: 73 M with h/o cirrhosis, CVAs seizure d/o presented to ED with status epilepticus and intubated in ED. Admitted by PCCM service  MAJOR EVENTS/TEST RESULTS: 06/12 Admitted via ED with status epilepticus, VDRF 06/12 CT head: Generalized atrophy with interval decrease in size of the low-attenuation focus seen previously at the high RIGHT frontal lobe compatible with evolving subacute infarction. New areas of low attenuation and loss of gray-white differentiation in the posterior RIGHT parietal and occipital lobes compatible with acute to subacute infarction. 06/13 Neuro consultation: recommended increased dose of Keppra. EEG ordered. MRI ordered 06/13 ammonia level > 500. Lactulose, rifaximin initiated 06/13 EEG: This is an abnormal EEG secondary to general background slowing. This finding may be seen with a diffuse disturbance that is etiologically nonspecific, but is consistent with the patient's current medications and may be related to the patient being post-ictal as well. No epileptiform activity is noted 06/13 MRI: New extensive cortically based restricted diffusion throughout both cerebral hemispheres which may reflect anoxic injury or the sequelae of recent seizure activity. Creutzfeldt-Jakob disease was considered but is felt unlikely given the clinical presentation and rapid development of these findings. New edema in the posteromedial right temporal and right occipital lobes suspicious for subacute PCA territory infarct. Slightly decreased size of subacute right frontoparietal hematoma 06/13 Pt's son requesting transfer to [facility]. Dr Donnie Aho @ [facility] accepted pt. Awaiting bed availability 06/13 Further seizure despite Keppra, propofol. Midazolam infusion initiated and Vimpat added 06/14 repeat EEG: This is an abnormal electroencephalogram due to the attenuated,  slow background. This activity is consistent with the patient's medications and can be seen in the postictal state, but can be seen with other pathologies as well. Clinical correlation recommended. No epileptiform activity is noted 06/15 Midazolam infusion stopped. Remains comatose on propofol infusion  INDWELLING DEVICES:: ETT 06/12 >>  L IJ CVL 06/13 >>   MICRO DATA: MRSA PCR 06/12 >> NEG  ANTIMICROBIALS:     SUBJECTIVE:  RASS -5   VITAL SIGNS: BP 139/70 mmHg  Pulse 103  Temp(Src) 100.2 F (37.9 C) (Core (Comment))  Resp 32  Ht  (1.854 m)  Wt 190 lb 4.1 oz (86.3 kg)  BMI 25.11 kg/m2  SpO2 98%  HEMODYNAMICS:    VENTILATOR SETTINGS: Vent Mode:  [-] PRVC FiO2 (%):  [35 %] 35 % Set Rate:  [16 bmp] 16 bmp Vt Set:  [550 mL] 550 mL PEEP:  [5 cmH20] 5 cmH20 Plateau Pressure:  [17 cmH20] 17 cmH20  INTAKE / OUTPUT: I/O last 3 completed shifts: In: 8772.2 [I.V.:6493.5; NG/GT:1868.7; IV Piggyback:410] Out: 1950 [Urine:1000; Emesis/NG output:950]  PHYSICAL EXAMINATION: General: comatose Neuro: pupils react, no spont movement, no withdrawal from pain  HEENT: NCAT, sclericterus  Cardiovascular: regular, no M Lungs:Coarse, no wheezes  Abdomen:  soft, nondistended, +BS Ext: warm, no edema  LABS:  BMET  Recent Labs Lab 06/05/16 0504 06/05/16 1617 06/06/16 0435  NA 144 142 144  K 5.4* 4.8 3.4*  CL 121* 118* 121*  CO2 18* 14* 13*  BUN 32* 39* 44*  CREATININE 1.84* 2.27* 1.82*  GLUCOSE 81 95 107*    Electrolytes  Recent Labs Lab 06/04/16 1126 06/04/16 2020 06/05/16 0504 06/05/16 1617 06/06/16 0435  CALCIUM  --   --  9.0 8.9 8.6*  MG 1.8 2.0 1.9  --   --   PHOS 3.5 5.7* 7.0*  --   --  CBC  Recent Labs Lab 06/04/16 0603 06/05/16 0504 06/06/16 0435  WBC 6.7 10.9* 10.2  HGB 9.8* 9.6* 9.0*  HCT 29.0* 29.1* 27.3*  PLT 163 164 142*    Coag's No results for input(s): APTT, INR in the last 168 hours.  Sepsis Markers  Recent  Labs Lab 06/04/16 1126 06/05/16 0504 06/06/16 0435  PROCALCITON <0.10 0.24 0.27    ABG  Recent Labs Lab 06/03/16 1525 06/04/16 0445 06/04/16 1554  PHART 7.48* 7.62* 7.42  PCO2ART 24* <19* 25*  PO2ART 130* 148* 106    Liver Enzymes  Recent Labs Lab 06/03/16 1416 06/05/16 0504 06/06/16 0435  AST 67* 60* 59*  ALT 27 25 25   ALKPHOS 72 52 49  BILITOT 4.7* 4.8* 4.4*  ALBUMIN 2.3* 2.2* 2.1*    Cardiac Enzymes  Recent Labs Lab 06/03/16 1416  TROPONINI <0.03    Glucose  Recent Labs Lab 06/05/16 1130 06/05/16 1635 06/05/16 2007 06/05/16 2337 06/06/16 0433 06/06/16 0742  GLUCAP 92 80 85 96 98 109*    CXR: NACPD   IMPRESSION/PLAN: NEUROLOGIC A:   Status epilepticus -clinically resolved Hepatic encephalopathy 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 Coma P:   Anti-convulsant therapies per Neurology  Presently on Keppra 1500 mg IV BID, Vimpat 100 mg IV BID Cont propofol today Daily WUA beginning 06/16 Cont lactulose and rifaximin  PULMONARY A: VDRF due to AMS Primary respiratory alkalosis due to hepatic encephalopathy P:   Cont full vent support - settings reviewed and/or adjusted Cont vent bundle Daily SBT if/when meets criteria  CARDIOVASCULAR A:  History of hypertension P: Monitor - MAP goal > 75 mmHg  Continue norepinephrine infusion  RENAL A:   AKI, now nonoliguric - Cr improving Mild hypokalemia P:   Monitor BMET intermittently Monitor I/Os Correct electrolytes as indicated IVFs adjusted 06/15  GASTROINTESTINAL A:   Severe cirrhosis Markedly elevated ammonia P:   SUP: enteral famotidine Initiate TF protocol  HEMATOLOGIC A:   Anemia without acute blood loss 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 I left message that he should call ICU or be present this morning  CCM time: 35 mins The above time includes time spent in consultation with patient and/or family members and reviewing care plan on multidisciplinary rounds  Billy Fischeravid Magally Vahle, MD PCCM service Mobile [phone] Pager [phone] 06/06/2016

## 2016-06-06 NOTE — Progress Notes (Signed)
Nutrition Follow-up  DOCUMENTATION CODES:   Not applicable  INTERVENTION:  -TF: pt tolerating Vital High Protein at rate of 20 ml/hr; recommend changing formula to Vital AF 1.2 with goal of 50 ml/hr with Prostat TID providing 1740 kcals, 135 g of protein, 972 mL of free water to better meet nutritional needs. Initiate PEPuP protocol. Additional free water flushes of 200 mL q 8 hours; total of 1572 mL of free water.  Additional kcals from diprivan and D5 at present.   NUTRITION DIAGNOSIS:   Inadequate oral intake related to acute illness as evidenced by NPO status.  Being addressed via TF  GOAL:   Provide needs based on ASPEN/SCCM guidelines  MONITOR:   Vent status, TF tolerance, Weight trends, Labs  REASON FOR ASSESSMENT:   Ventilator, Consult Enteral/tube feeding initiation and management  ASSESSMENT:   55 yo male admitted with acute respiratory failure requiring intubation on 06/03/16, hepatic encephalopathy, seizures. [facility]16 after admission for acute respiratory failure s/p seizures requiring intubation  Pt remains on vent, no gag reflex, minimally responsive;  levophed down to 24 mcg/min. Creatinine and UOP improved  Tolerting Vital High Protein at rate of 20 ml/hr, Prostat 60 mL TID  Diet Order:   NPO  Skin:   (stage III sacrum, deep tissue injury heel)  Last BM:  + smear 06/05/16   Labs: potassium 3.4, sodium wdl, Creatinine 1.82, ammonia 210, Hgb 9.0, corrected calcium 10.1  Meds: D5 at 50 ml/hr (204 kcals in 24 hours), diprivan, levophed, lactulose, xifaxan  Height:   Ht Readings from Last 1 Encounters:  06/03/16 6\' 1"  (1.854 m)    Weight:   Wt Readings from Last 1 Encounters:  06/05/16 190 lb 4.1 oz (86.3 kg)   Filed Weights   06/03/16 1945 06/04/16 0346 06/05/16 0500  Weight: 187 lb 13.3 oz (85.2 kg) 188 lb 11.4 oz (85.6 kg) 190 lb 4.1 oz (86.3 kg)    BMI:  Body mass index is 25.11 kg/(m^2).  Estimated  Nutritional Needs:   Kcal:  2163 kcals  Protein:  129-172 g   Fluid:  >/= 2 L  EDUCATION NEEDS:   No education needs identified at this time  Romelle StarcherCate Zakary Kimura MS, RD, LDN [phone] Pager  [phone] Weekend/On-Call Pager

## 2016-06-06 NOTE — Progress Notes (Addendum)
Subjective: Patient remains intubated and sedated.  No clinical seizure activity noted.    Objective: Current vital signs: BP 138/71 mmHg  Pulse 105  Temp(Src) 100.2 F (37.9 C) (Core (Comment))  Resp 31  Ht '6\' 1"'  (1.854 m)  Wt 86.3 kg (190 lb 4.1 oz)  BMI 25.11 kg/m2  SpO2 98% Vital signs in last 24 hours: Temp:  [97 F (36.1 C)-100.6 F (38.1 C)] 100.2 F (37.9 C) (06/15 1000) Pulse Rate:  [71-105] 105 (06/15 1000) Resp:  [18-32] 31 (06/15 1000) BP: (93-142)/(50-71) 138/71 mmHg (06/15 1000) SpO2:  [96 %-100 %] 98 % (06/15 1100) FiO2 (%):  [30 %-35 %] 30 % (06/15 1100)  Intake/Output from previous day: 06/14 0701 - 06/15 0700 In: 7683.9 [I.V.:6070.3; JQ/ZE:0923.3; IV Piggyback:265] Out: 007 [Urine:875] Intake/Output this shift: Total I/O In: -  Out: 225 [Urine:225] Nutritional status:    Neurologic Exam: Mental Status: Patient does not respond to verbal stimuli. Does not respond to deep sternal rub. Does not follow commands. No verbalizations noted.  Cranial Nerves: II: patient does not respond confrontation bilaterally, pupils right 4 mm, left 4 mm,and reactive bilaterally III,IV,VI: doll's response absent bilaterally.  V,VII: corneal reflex absent bilaterally  VIII: patient does not respond to verbal stimuli IX,X: gag reflex reduced, XI: trapezius strength unable to test bilaterally XII: tongue strength unable to test Motor: Extremities flaccid throughout. No spontaneous movement noted. No purposeful movements noted. Sensory: Does not respond to noxious stimuli in any extremity.  Lab Results: Basic Metabolic Panel:  Recent Labs Lab 06/03/16 1416 06/04/16 0603 06/04/16 1126 06/04/16 2020 06/05/16 0504 06/05/16 1617 06/06/16 0435  NA 135 140  --   --  144 142 144  K 4.3 3.5  --   --  5.4* 4.8 3.4*  CL 112* 117*  --   --  121* 118* 121*  CO2 17* 15*  --   --  18* 14* 13*  GLUCOSE 95 85  --   --  81 95 107*  BUN 9 14  --   --  32* 39* 44*   CREATININE 0.40* 0.57*  --   --  1.84* 2.27* 1.82*  CALCIUM 8.9 8.8*  --   --  9.0 8.9 8.6*  MG 1.8 1.8 1.8 2.0 1.9  --   --   PHOS  --  3.3 3.5 5.7* 7.0*  --   --     Liver Function Tests:  Recent Labs Lab 06/03/16 1416 06/05/16 0504 06/06/16 0435  AST 67* 60* 59*  ALT '27 25 25  ' ALKPHOS 72 52 49  BILITOT 4.7* 4.8* 4.4*  PROT 7.6 7.1 7.1  ALBUMIN 2.3* 2.2* 2.1*   No results for input(s): LIPASE, AMYLASE in the last 168 hours.  Recent Labs Lab 06/04/16 1126 06/05/16 0506 06/06/16 0435  AMMONIA 518* 617* 210*    CBC:  Recent Labs Lab 06/03/16 1416 06/04/16 0603 06/05/16 0504 06/06/16 0435  WBC 6.6 6.7 10.9* 10.2  NEUTROABS 3.9  --   --   --   HGB 10.2* 9.8* 9.6* 9.0*  HCT 30.8* 29.0* 29.1* 27.3*  MCV 88.8 88.2 91.4 92.0  PLT 173 163 164 142*    Cardiac Enzymes:  Recent Labs Lab 06/03/16 1416  TROPONINI <0.03    Lipid Panel:  Recent Labs Lab 06/04/16 1126  TRIG 58    CBG:  Recent Labs Lab 06/05/16 1635 06/05/16 2007 06/05/16 2337 06/06/16 0433 06/06/16 0742  GLUCAP 80 85 96 98 109*    Microbiology:  Results for orders placed or performed during the hospital encounter of 06/03/16  MRSA PCR Screening     Status: None   Collection Time: 06/03/16  4:36 PM  Result Value Ref Range Status   MRSA by PCR NEGATIVE NEGATIVE Final    Comment:        The GeneXpert MRSA Assay (FDA approved for NASAL specimens only), is one component of a comprehensive MRSA colonization surveillance program. It is not intended to diagnose MRSA infection nor to guide or monitor treatment for MRSA infections.     Coagulation Studies: No results for input(s): LABPROT, INR in the last 72 hours.  Imaging: Mr Jeri Cos DT Contrast  06/04/2016  CLINICAL DATA:  Seizures. EXAM: MRI HEAD WITHOUT AND WITH CONTRAST TECHNIQUE: Multiplanar, multiecho pulse sequences of the brain and surrounding structures were obtained without and with intravenous contrast. CONTRAST:   37m MULTIHANCE GADOBENATE DIMEGLUMINE 529 MG/ML IV SOLN COMPARISON:  Head CT 06/03/2016 and MRI 05/17/2016 FINDINGS: Right frontoparietal parasagittal lesion has slightly decreased in size, measuring 2.3 x 1.4 cm. The lesion again demonstrates T2 hyperintensity with a T2 hypointense rim, mixed T1 signal intensity, and peripheral susceptibility artifact most consistent with evolving subacute hematoma. There is no significant associated enhancement or surrounding edema. Intrinsic T1 hyperintensity in the globus pallidus bilaterally is again noted and is consistent with known chronic liver disease. There is a chronic microhemorrhage in the left putamen which is unchanged. No midline shift or extra-axial fluid collection is seen. Cerebral volume is unchanged. No ventriculomegaly. There are multiple new areas of extensive cortically based restricted diffusion throughout both cerebral hemispheres. There is greatest involvement of the parietal and posterior temporal lobes, right greater than left. Bilateral frontal lobe involvement is greatest medially. There is also diffuse involvement of the insula bilaterally. Mild diffusion abnormality is present in the dorsomedial thalami. There is new T2 hyperintensity/ edema within the posterior right temporal lobe including the need parahippocampal gyrus which is contiguous with edema in the anteromedial right occipital lobe. There is gyral swelling without restricted diffusion. There is suggestion of subtle gyriform enhancement in the medial right occipital lobe. Orbits are unremarkable. Small volume fluid in the left sphenoid sinus. Mild mucosal thickening and small retention cyst in the left maxillary sinus. Clear mastoid air cells. Major intracranial vascular flow voids are preserved. IMPRESSION: 1. New extensive cortically based restricted diffusion throughout both cerebral hemispheres which may reflect anoxic injury or the sequelae of recent seizure activity.  Creutzfeldt-Jakob disease was considered but is felt unlikely given the clinical presentation and rapid development of these findings. 2. New edema in the posteromedial right temporal and right occipital lobes suspicious for subacute PCA territory infarct. 3. Slightly decreased size of subacute right frontoparietal hematoma. Electronically Signed   By: ALogan BoresM.D.   On: 06/04/2016 16:35   Dg Chest 1v Repeat Same Day  06/04/2016  CLINICAL DATA:  Central line placement EXAM: CHEST - 1 VIEW SAME DAY COMPARISON:  06/04/2016 FINDINGS: Cardiomediastinal silhouette is stable. Stable endotracheal and NG tube position. No acute infiltrate or pulmonary edema. Moderate gas noted within stomach. There is left IJ central line with tip in SVC. No pneumothorax. IMPRESSION: Stable endotracheal and NG tube position. Left IJ central line in place. No pneumothorax. Electronically Signed   By: LLahoma CrockerM.D.   On: 06/04/2016 12:47   Dg Chest Port 1 View  06/06/2016  CLINICAL DATA:  55year old male with respiratory failure status post seizures. Initial encounter. EXAM: PORTABLE CHEST 1  VIEW COMPARISON:  06/05/2016 and earlier. FINDINGS: Portable AP semi upright view at 0607 hours. Endotracheal tube tip remains in good position between the level the clavicles and carina. Enteric tube courses to the abdomen, tip not included. Stable left IJ central line. Mildly lower lung volumes. Mediastinal contours remain normal. No pneumothorax or pleural effusion. Mildly increased pulmonary interstitial markings diffusely over this series of exams. Interval increased bilateral infrahilar opacity. IMPRESSION: 1.  Stable lines and tubes. 2. Increased infrahilar opacity since yesterday, but might reflect atelectasis. Additional mild progression of nonspecific pulmonary interstitial opacity over this series of exams. Developing infection is difficult to exclude. Electronically Signed   By: Genevie Ann M.D.   On: 06/06/2016 07:54   Dg Chest  Port 1 View  06/05/2016  CLINICAL DATA:  Respiratory failure. EXAM: PORTABLE CHEST 1 VIEW COMPARISON:  06/04/2016. FINDINGS: Endotracheal tube, left IJ line, NG tube in stable position. No gastric distention noted on today's exam . Mediastinum hilar structures are normal. Cardiomegaly with normal pulmonary vascularity. Left base subsegmental atelectasis. No pleural effusion or pneumothorax. IMPRESSION: 1. Lines and tubes in stable position. Interim resolution of gastric distention. 2. Left base subsegmental atelectasis and or infiltrates. Electronically Signed   By: Marcello Moores  Register   On: 06/05/2016 07:22    Medications:  I have reviewed the patient's current medications. Scheduled: . antiseptic oral rinse  7 mL Mouth Rinse QID  . chlorhexidine gluconate (SAGE KIT)  15 mL Mouth Rinse BID  . enoxaparin (LOVENOX) injection  40 mg Subcutaneous Q24H  . famotidine  20 mg Per Tube Daily  . feeding supplement (PRO-STAT SUGAR FREE 64)  30 mL Per Tube TID  . free water  200 mL Per Tube Q8H  . lacosamide (VIMPAT) IV  100 mg Intravenous Q12H  . lactulose  30 g Per Tube BID  . levETIRAcetam  1,500 mg Intravenous Q12H  . norepinephrine      . rifaximin  550 mg Per Tube TID  . sodium chloride  250 mL Intravenous Once    Assessment/Plan: Patient remains intubated, sedated and without clinical seizure activity.  On Keppra and Vimpat, Propofol and Versed.  EEG shows attenuated slow activity but no epileptiform activity. Patient scheduled for transfer to [facility] but bed not available.  Spoke with son today who will not accept transfer to any other facility.  Agree with continued Keppra and Vimpat.      LOS: 3 days   Alexis Goodell, MD Neurology [phone] 06/06/2016  12:28 PM

## 2016-06-06 NOTE — Progress Notes (Signed)
Pt remains unresponsive to gag, cough, and nailbed pressures. Pt remains on Levophed, Propofol, and D5 with 40 Meq of K. Pt is stable at this time.  Report given to oncoming RN.

## 2016-06-06 NOTE — Progress Notes (Signed)
Pt's O2 sats were observed at 90% on 30% FiO2 and CO2 level reading 14. [place],NP called to bedside. Made aware that this is a change from earlier today. Concern acknowledged. No new orders given at this time.  Will continue to assess.

## 2016-06-06 NOTE — Progress Notes (Signed)
[facility] called for pt update. Pt pupils are now 2, reactive. No cough, no gag.  KUB showed ileus, OG at low-int suction.

## 2016-06-06 NOTE — Clinical Social Work Note (Signed)
Patient's son attempted to call CSW and instead of speaking with CSW he spoke with a unit nurse on a floor that patient was not on. That nurse relayed to this CSW that patient's son stated he would not be available for the rest of the day for contact. Patient continues to be on [facility] for a continuous monitoring. Per MD documentation, patient's son will not agree to any other [facility]. Patient remains on ventilator as well. York SpanielMonica Edwin Cherian MSW,LCSW [phone]

## 2016-06-06 NOTE — Progress Notes (Signed)
eLink Physician-Brief Progress Note Patient Name: Jon PatteeWayne Morgan DOB: 08/25/1961 MRN: [id number]   Date of Service  06/06/2016  HPI/Events of Note  Oxygen Desaturation with turning, improved with moderately increased FiO2  eICU Interventions  CXR portable now     Intervention Category Major Interventions: Hypoxemia - evaluation and management  Max FickleDouglas McQuaid 06/06/2016, 10:13 PM

## 2016-06-06 NOTE — Care Management (Signed)
Barriers to progression- continued seizure activity with decrease of sedation.  neurology is actively consulting.  Area of concern for infarct right temporal and occipital lobes. [facility]

## 2016-06-06 NOTE — Progress Notes (Signed)
Patient not awake. Silent prayer offered for patient.

## 2016-06-06 NOTE — Progress Notes (Signed)
Per verbal orders from Dr.Simmonds. Versed held this am to see how pt responds this morning. Versed held at 0753. At 0924 pt observed having fine tremors in head and neck. Dr. Bard HerbertSimmonds notified and acknowledged. No new orders at this time, will continue to assess.

## 2016-06-07 ENCOUNTER — Inpatient Hospital Stay: Payer: Medicaid Other

## 2016-06-07 LAB — PROTIME-INR
INR: 2.19
PROTHROMBIN TIME: 24.2 s — AB (ref 11.4–15.0)

## 2016-06-07 LAB — BASIC METABOLIC PANEL
Anion gap: 7 (ref 5–15)
BUN: 34 mg/dL — ABNORMAL HIGH (ref 6–20)
CALCIUM: 8.5 mg/dL — AB (ref 8.9–10.3)
CO2: 17 mmol/L — ABNORMAL LOW (ref 22–32)
CREATININE: 0.88 mg/dL (ref 0.61–1.24)
Chloride: 115 mmol/L — ABNORMAL HIGH (ref 101–111)
Glucose, Bld: 98 mg/dL (ref 65–99)
Potassium: 3 mmol/L — ABNORMAL LOW (ref 3.5–5.1)
SODIUM: 139 mmol/L (ref 135–145)

## 2016-06-07 LAB — BLOOD GAS, ARTERIAL
Allens test (pass/fail): POSITIVE — AB
FIO2: 28
LHR: 16 {breaths}/min
MECHANICAL RATE: 16
O2 Saturation: 98 %
PATIENT TEMPERATURE: 37
PEEP: 5 cmH2O
VT: 550 mL
pCO2 arterial: <19 mmHg — CL (ref 32.0–48.0)
pH, Arterial: 7.62 (ref 7.350–7.450)
pO2, Arterial: 148 mmHg — ABNORMAL HIGH (ref 83.0–108.0)

## 2016-06-07 LAB — HEPATIC FUNCTION PANEL
ALT: 23 U/L (ref 17–63)
AST: 60 U/L — ABNORMAL HIGH (ref 15–41)
Albumin: 2.1 g/dL — ABNORMAL LOW (ref 3.5–5.0)
Alkaline Phosphatase: 51 U/L (ref 38–126)
BILIRUBIN DIRECT: 3 mg/dL — AB (ref 0.1–0.5)
BILIRUBIN INDIRECT: 2.4 mg/dL — AB (ref 0.3–0.9)
BILIRUBIN TOTAL: 5.4 mg/dL — AB (ref 0.3–1.2)
Total Protein: 7 g/dL (ref 6.5–8.1)

## 2016-06-07 LAB — GLUCOSE, CAPILLARY
GLUCOSE-CAPILLARY: 84 mg/dL (ref 65–99)
GLUCOSE-CAPILLARY: 73 mg/dL (ref 65–99)
Glucose-Capillary: 91 mg/dL (ref 65–99)
Glucose-Capillary: 85 mg/dL (ref 65–99)
Glucose-Capillary: 85 mg/dL (ref 65–99)

## 2016-06-07 LAB — AMMONIA: AMMONIA: 140 umol/L — AB (ref 9–35)

## 2016-06-07 LAB — PROCALCITONIN: Procalcitonin: 0.18 ng/mL

## 2016-06-07 LAB — APTT: APTT: 56 s — AB (ref 24–36)

## 2016-06-07 MED ORDER — VITAL AF 1.2 CAL PO LIQD
1000.0000 mL | ORAL | Status: DC
  Administered 2016-06-07: 1000 mL

## 2016-06-07 MED ORDER — PROPOFOL 1000 MG/100ML IV EMUL
5.0000 ug/kg/min | INTRAVENOUS | Status: DC
  Administered 2016-06-08: 20 ug/kg/min via INTRAVENOUS
  Administered 2016-06-09: 10 ug/kg/min via INTRAVENOUS
  Filled 2016-06-07 (×3): qty 100

## 2016-06-07 MED ORDER — SODIUM CHLORIDE 0.9 % IV SOLN
200.0000 mg | Freq: Two times a day (BID) | INTRAVENOUS | Status: DC
  Administered 2016-06-07 – 2016-06-12 (×10): 200 mg via INTRAVENOUS
  Filled 2016-06-07 (×15): qty 20

## 2016-06-07 MED ORDER — FAMOTIDINE 40 MG/5ML PO SUSR
20.0000 mg | Freq: Two times a day (BID) | ORAL | Status: DC
  Administered 2016-06-07 – 2016-06-12 (×11): 20 mg
  Filled 2016-06-07 (×14): qty 2.5

## 2016-06-07 MED ORDER — SODIUM CHLORIDE 0.9 % IV SOLN
150.0000 mg | Freq: Two times a day (BID) | INTRAVENOUS | Status: DC
  Filled 2016-06-07 (×2): qty 15

## 2016-06-07 MED ORDER — POTASSIUM CHLORIDE 20 MEQ/15ML (10%) PO SOLN
40.0000 meq | Freq: Three times a day (TID) | ORAL | Status: DC
  Administered 2016-06-07 – 2016-06-09 (×7): 40 meq
  Filled 2016-06-07 (×6): qty 30

## 2016-06-07 MED ORDER — LACOSAMIDE 200 MG/20ML IV SOLN
50.0000 mg | Freq: Once | INTRAVENOUS | Status: AC
  Administered 2016-06-07: 50 mg via INTRAVENOUS
  Filled 2016-06-07: qty 5

## 2016-06-07 MED ORDER — POTASSIUM CHLORIDE 20 MEQ/15ML (10%) PO SOLN
40.0000 meq | Freq: Once | ORAL | Status: DC
  Filled 2016-06-07: qty 30

## 2016-06-07 NOTE — Progress Notes (Signed)
Nutrition Follow-up  DOCUMENTATION CODES:   Not applicable  INTERVENTION:  -Discussed nutritional poc during ICU rounds with MD Sung AmabileSimonds, MD restarted TF at rate of 20 ml/hr today with no orders for titration. Reassess for titration tomorrow if pt tolerating trophic feedings. If pt remains off diprivan, will need to reassess goal rate as well  NUTRITION DIAGNOSIS:   Inadequate oral intake related to acute illness as evidenced by NPO status.  Being addressed via TF  GOAL:   Provide needs based on ASPEN/SCCM guidelines  MONITOR:   Vent status, TF tolerance, Weight trends, Labs  REASON FOR ASSESSMENT:   Ventilator, Consult Enteral/tube feeding initiation and management  ASSESSMENT:   55 yo male admitted with acute respiratory failure requiring intubation on 06/03/16, hepatic encephalopathy, seizures. [facility] 05/25/16 after admission for acute respiratory failure s/p seizures requiring intubation  Pt remains on vent, sedation on hold this AM.  Abdominal xray performed yesterday and showing ileus, TF placed on hold, OG placed to LIS with 1400 mL out. Pt now having stool via flexiseal. Abdomen soft, BS active  Diet Order:   NPO  Skin:   (stage III sacrum, deep tissue injury heel)  Last BM:  6/16 flexiseal in place with liquid stool   Labs: potassium 3.0  Meds: lactulose, levophed, potassium chloride, xifaxan  Height:   Ht Readings from Last 1 Encounters:  06/03/16 6\' 1"  (1.854 m)    Weight:   Wt Readings from Last 1 Encounters:  06/07/16 192 lb 10.9 oz (87.4 kg)    BMI:  Body mass index is 25.43 kg/(m^2).  Estimated Nutritional Needs:   Kcal:  2163 kcals  Protein:  129-172 g   Fluid:  >/= 2 L  EDUCATION NEEDS:   No education needs identified at this time  Romelle StarcherCate Trajon Rosete MS, RD, LDN [phone] Pager  [phone] Weekend/On-Call Pager

## 2016-06-07 NOTE — Progress Notes (Signed)
Notified Dr. Bard HerbertSimmonds about abdominal xray showing ileus and that patient had around 1400ml output total from OG tube.  Patient just now starting to pass stool in flexiseal.  Also asked Dr. Bard HerbertSimmonds if he would like to replace potassium IV versus PO due to ileus.  He stated to replace potassium PO- turn off LIS to OG- he would replace orders. Patient's propofol has been off since 8am- patient has no gag/cough reflex.  Patient in spontaneous mode on the ventilator- tolerating well.

## 2016-06-07 NOTE — Progress Notes (Signed)
PULMONARY / CRITICAL CARE MEDICINE   Name: Jon Morgan MRN: [id number] DOB: Dec 26, 1960    ADMISSION DATE:  06/03/2016  PT PROFILE: 38 M with h/o cirrhosis, CVAs seizure d/o presented to ED with status epilepticus and intubated in ED. Admitted by PCCM service  MAJOR EVENTS/TEST RESULTS: 06/12 Admitted via ED with status epilepticus, VDRF 06/12 CT head: Generalized atrophy with interval decrease in size of the low-attenuation focus seen previously at the high RIGHT frontal lobe compatible with evolving subacute infarction. New areas of low attenuation and loss of gray-white differentiation in the posterior RIGHT parietal and occipital lobes compatible with acute to subacute infarction. 06/13 Neuro consultation: recommended increased dose of Keppra. EEG ordered. MRI ordered 06/13 ammonia level > 500. Lactulose, rifaximin initiated 06/13 EEG: This is an abnormal EEG secondary to general background slowing. This finding may be seen with a diffuse disturbance that is etiologically nonspecific, but is consistent with the patient's current medications and may be related to the patient being post-ictal as well. No epileptiform activity is noted 06/13 MRI: New extensive cortically based restricted diffusion throughout both cerebral hemispheres which may reflect anoxic injury or the sequelae of recent seizure activity. Creutzfeldt-Jakob disease was considered but is felt unlikely given the clinical presentation and rapid development of these findings. New edema in the posteromedial right temporal and right occipital lobes suspicious for subacute PCA territory infarct. Slightly decreased size of subacute right frontoparietal hematoma 06/13 Pt's son requesting transfer to [facility]. Dr Donnie Aho @ [facility] accepted pt. Awaiting bed availability 06/13 Further seizure despite Keppra, propofol. Midazolam infusion initiated and Vimpat added 06/14 repeat EEG: This is an abnormal electroencephalogram due to the attenuated,  slow background. This activity is consistent with the patient's medications and can be seen in the postictal state, but can be seen with other pathologies as well. Clinical correlation recommended. No epileptiform activity is noted 06/15 Midazolam infusion stopped. Remains comatose on propofol infusion 06/16 Remained comatose. Propofol infusion discontinued. Repeat EEG:   INDWELLING DEVICES:: ETT 06/12 >>  L IJ CVL 06/13 >>   MICRO DATA: MRSA PCR 06/12 >> NEG  PROCALCITONIN: 06/16:  06/17:  06/18:   ANTIMICROBIALS:     SUBJECTIVE:  RASS -5   VITAL SIGNS: BP 113/78 mmHg  Pulse 107  Temp(Src) 98.8 F (37.1 C) (Core (Comment))  Resp 32  Ht  (1.854 m)  Wt 192 lb 10.9 oz (87.4 kg)  BMI 25.43 kg/m2  SpO2 100%  HEMODYNAMICS:    VENTILATOR SETTINGS: Vent Mode:  [-] CPAP;PSV FiO2 (%):  [30 %-65 %] 40 % Set Rate:  [16 bmp] 16 bmp Vt Set:  [550 mL] 550 mL PEEP:  [5 cmH20] 5 cmH20 Pressure Support:  [8 cmH20] 8 cmH20  INTAKE / OUTPUT: I/O last 3 completed shifts: In: 6436.2 [I.V.:5197.8; NG/GT:788.3; IV Piggyback:450] Out: 2725 [Urine:1925; Emesis/NG output:800]  PHYSICAL EXAMINATION: General: comatose Neuro: pupils react, no spont movement, no withdrawal from pain  HEENT: NCAT, sclericterus  Cardiovascular: regular, no M Lungs:Coarse, no wheezes  Abdomen:  soft, nondistended, +BS Ext: warm, no edema  LABS:  BMET  Recent Labs Lab 06/05/16 1617 06/06/16 0435 06/07/16 0431  NA 142 144 139  K 4.8 3.4* 3.0*  CL 118* 121* 115*  CO2 14* 13* 17*  BUN 39* 44* 34*  CREATININE 2.27* 1.82* 0.88  GLUCOSE 95 107* 98    Electrolytes  Recent Labs Lab 06/04/16 1126 06/04/16 2020 06/05/16 0504 06/05/16 1617 06/06/16 0435 06/07/16 0431  CALCIUM  --   --  9.0 8.9 8.6* 8.5*  MG 1.8 2.0 1.9  --   --   --   PHOS 3.5 5.7* 7.0*  --   --   --     CBC  Recent Labs Lab 06/04/16 0603 06/05/16 0504 06/06/16 0435  WBC 6.7 10.9* 10.2  HGB 9.8* 9.6*  9.0*  HCT 29.0* 29.1* 27.3*  PLT 163 164 142*    Coag's  Recent Labs Lab 06/07/16 0431  APTT 56*  INR 2.19    Sepsis Markers  Recent Labs Lab 06/04/16 1126 06/05/16 0504 06/06/16 0435  PROCALCITON <0.10 0.24 0.27    ABG  Recent Labs Lab 06/04/16 0445 06/04/16 1554 06/06/16 2032  PHART 7.62* 7.42 7.43  PCO2ART <19* 25* 27*  PO2ART 148* 106 72*    Liver Enzymes  Recent Labs Lab 06/05/16 0504 06/06/16 0435 06/07/16 0431  AST 60* 59* 60*  ALT 25 25 23   ALKPHOS 52 49 51  BILITOT 4.8* 4.4* 5.4*  ALBUMIN 2.2* 2.1* 2.1*    Cardiac Enzymes  Recent Labs Lab 06/03/16 1416  TROPONINI <0.03    Glucose  Recent Labs Lab 06/06/16 0742 06/06/16 1642 06/06/16 1943 06/06/16 2355 06/07/16 0349 06/07/16 0733  GLUCAP 109* 108* 107* 103* 91 84   AMMONIA:   CXR: LLL atx 06/14: 617 06/15: 210 06/16:140   IMPRESSION/PLAN: NEUROLOGIC A:   Status epilepticus, refractory Hepatic encephalopathy 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 Coma P:   Anti-convulsant therapies per Neurology  Presently on Keppra 1500 mg IV BID, Vimpat 100 mg IV BID Daily WUA beginning 06/16 Cont lactulose and rifaximin Monitor ammonia level intermittently  PULMONARY A: VDRF due to AMS Primary respiratory alkalosis due to hepatic encephalopathy P:   Cont full vent support - settings reviewed and/or adjusted Cont vent bundle Daily SBT if/when meets criteria  CARDIOVASCULAR A:  History of hypertension P: Monitor - MAP goal > 70 mmHg  Continue norepinephrine infusion as needed  RENAL A:   AKI, resolved Recurrent hypokalemia P:   Monitor BMET intermittently Monitor I/Os Correct electrolytes as indicated IVFs adjusted 06/16  GASTROINTESTINAL A:   Severe cirrhosis Markedly elevated ammonia P:   SUP: enteral famotidine Cont TF protocol  HEMATOLOGIC A:   Anemia without acute blood loss 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 I left a message for him to contact me through the ICU or meet me there. Unfortunately, I have yet to encounter him. He has requested (to a previous provider and to our staff) that his [facility]. We have made that request and [facility] the pt in transfer 06/13. We have called [facility] each day and been told that there are no ICU beds available  CCM time: 40 mins The above time includes time spent in consultation with patient and/or family members and reviewing care plan on multidisciplinary rounds  Billy Fischeravid Keegan Bensch, MD PCCM service Mobile [phone] Pager [phone] 06/07/2016

## 2016-06-07 NOTE — Progress Notes (Signed)
Notified Dr. Bard HerbertSimmonds that patient is having fine seizure -like activity- twitching of the head.  Patient given 2mg  of ativan.  I paged Dr. Thad Rangereynolds to notify her- waiting on call back.

## 2016-06-07 NOTE — Care Management (Signed)
Remains comatose. Infusions stopped. EEG pending. [facility]. No beds available.

## 2016-06-07 NOTE — Progress Notes (Signed)
eLink Physician-Brief Progress Note Patient Name: Jon PatteeWayne Morgan DOB: 09/28/1961 MRN: [id number]   Date of Service  06/07/2016  HPI/Events of Note  Hypokalemia  eICU Interventions  Potassium replaced     Intervention Category Intermediate Interventions: Electrolyte abnormality - evaluation and management  DETERDING,ELIZABETH 06/07/2016, 5:39 AM

## 2016-06-07 NOTE — Progress Notes (Signed)
Notified Dr. Thad Rangereynolds the frequency of PRN Ativan and Versed that I have administered to the patient throughout shift.  No new orders.

## 2016-06-07 NOTE — Progress Notes (Addendum)
Notified Dr. Thad Rangereynolds that patient was having fine twitches/seizure-like activity of the head and I gave 2mg  of Ativan.  She stated that she would make changes to the patient's medications.

## 2016-06-07 NOTE — Progress Notes (Addendum)
Subjective: Patient remains intubated. Off Propofol and Versed but remains unresponsive.  No clinical seizure activity noted.    Objective: Current vital signs: BP 113/78 mmHg  Pulse 107  Temp(Src) 98.8 F (37.1 C) (Core (Comment))  Resp 32  Ht 6' 1" (1.854 m)  Wt 87.4 kg (192 lb 10.9 oz)  BMI 25.43 kg/m2  SpO2 100% Vital signs in last 24 hours: Temp:  [96.3 F (35.7 C)-100.4 F (38 C)] 98.8 F (37.1 C) (06/16 0800) Pulse Rate:  [87-122] 107 (06/16 0800) Resp:  [22-36] 32 (06/16 0800) BP: (102-146)/(57-81) 113/78 mmHg (06/16 0800) SpO2:  [87 %-100 %] 100 % (06/16 0800) FiO2 (%):  [30 %-65 %] 40 % (06/16 0800) Weight:  [87.4 kg (192 lb 10.9 oz)-92.6 kg (204 lb 2.3 oz)] 87.4 kg (192 lb 10.9 oz) (06/16 0408)  Intake/Output from previous day: 06/15 0701 - 06/16 0700 In: 2707 [I.V.:2108.7; NG/GT:298.3; IV Piggyback:300] Out: 2100 [Urine:1300; Emesis/NG output:800] Intake/Output this shift: Total I/O In: 38.6 [I.V.:18.6; NG/GT:20] Out: -  Nutritional status:    Neurologic Exam: Mental Status: Patient does not respond to verbal stimuli. Does not respond to deep sternal rub. Does not follow commands. No verbalizations noted.  Cranial Nerves: II: patient does not respond confrontation bilaterally, pupils right 4 mm, left 4 mm,and sluggishly reactive bilaterally III,IV,VI: doll's response absent bilaterally.  V,VII: corneal reflex absent on the right and weak on the left  VIII: patient does not respond to verbal stimuli IX,X: gag reflex reduced, XI: trapezius strength unable to test bilaterally XII: tongue strength unable to test Motor: Extremities flaccid throughout. No spontaneous movement noted. No purposeful movements noted. Sensory: Does not respond to noxious stimuli in any extremity.  Lab Results: Basic Metabolic Panel:  Recent Labs Lab 06/03/16 1416 06/04/16 0603 06/04/16 1126 06/04/16 2020 06/05/16 0504 06/05/16 1617 06/06/16 0435 06/07/16 0431   NA 135 140  --   --  144 142 144 139  K 4.3 3.5  --   --  5.4* 4.8 3.4* 3.0*  CL 112* 117*  --   --  121* 118* 121* 115*  CO2 17* 15*  --   --  18* 14* 13* 17*  GLUCOSE 95 85  --   --  81 95 107* 98  BUN 9 14  --   --  32* 39* 44* 34*  CREATININE 0.40* 0.57*  --   --  1.84* 2.27* 1.82* 0.88  CALCIUM 8.9 8.8*  --   --  9.0 8.9 8.6* 8.5*  MG 1.8 1.8 1.8 2.0 1.9  --   --   --   PHOS  --  3.3 3.5 5.7* 7.0*  --   --   --     Liver Function Tests:  Recent Labs Lab 06/03/16 1416 06/05/16 0504 06/06/16 0435 06/07/16 0431  AST 67* 60* 59* 60*  ALT _0 ALKPHOS 72 52 49 51  BILITOT 4.7* 4.8* 4.4* 5.4*  PROT 7.6 7.1 7.1 7.0  ALBUMIN 2.3* 2.2* 2.1* 2.1*   No results for input(s): LIPASE, AMYLASE in the last 168 hours.  Recent Labs Lab 06/05/16 0506 06/06/16 0435 06/07/16 0431  AMMONIA 617* 210* 140*    CBC:  Recent Labs Lab 06/03/16 1416 06/04/16 0603 06/05/16 0504 06/06/16 0435  WBC 6.6 6.7 10.9* 10.2  NEUTROABS 3.9  --   --   --   HGB 10.2* 9.8* 9.6* 9.0*  HCT 30.8* 29.0* 29.1* 27.3*  MCV 88.8 88.2 91.4 92.0  PLT  173 163 164 142*    Cardiac Enzymes:  Recent Labs Lab 06/03/16 1416  TROPONINI <0.03    Lipid Panel:  Recent Labs Lab 06/04/16 1126  TRIG 58    CBG:  Recent Labs Lab 06/06/16 1642 06/06/16 1943 06/06/16 2355 06/07/16 0349 06/07/16 0733  GLUCAP 108* 107* 103* 91 30    Microbiology: Results for orders placed or performed during the hospital encounter of 06/03/16  MRSA PCR Screening     Status: None   Collection Time: 06/03/16  4:36 PM  Result Value Ref Range Status   MRSA by PCR NEGATIVE NEGATIVE Final    Comment:        The GeneXpert MRSA Assay (FDA approved for NASAL specimens only), is one component of a comprehensive MRSA colonization surveillance program. It is not intended to diagnose MRSA infection nor to guide or monitor treatment for MRSA infections.     Coagulation Studies:  Recent Labs   06/07/16 0431  LABPROT 24.2*  INR 2.19    Imaging: Dg Abd 1 View  06/06/2016  CLINICAL DATA:  Abdominal distention EXAM: ABDOMEN - 1 VIEW COMPARISON:  May 21, 2016 FINDINGS: Nasogastric tube tip and side port are in the stomach. There are multiple loops of dilated bowel. Air is seen in the colon and rectum. A rectal thermometer is present. No free air is evident. Lung bases are clear. IMPRESSION: Bowel dilatation, primarily involving colon. Nasogastric tube tip and side port in stomach. No free air. Suspect a degree of colonic ileus as most likely etiology. Note that there is air in the rectum. Electronically Signed   By: Lowella Grip III M.D.   On: 06/06/2016 20:08   Dg Chest Port 1 View  06/06/2016  CLINICAL DATA:  Acute onset of hypoxia.  Initial encounter. EXAM: PORTABLE CHEST 1 VIEW COMPARISON:  Chest radiograph performed earlier today at 6:07 a.m. FINDINGS: The patient's endotracheal tube is seen ending 3-4 cm above the carina. An enteric tube is noted extending below the diaphragm. A left IJ line is noted ending about the mid SVC. There is mild elevation of the left hemidiaphragm. Vascular congestion is noted. Increased interstitial markings may reflect mild interstitial edema. A small left pleural effusion is noted. No pneumothorax is seen. The cardiomediastinal silhouette is normal in size. No acute osseous abnormalities are identified. IMPRESSION: 1. Endotracheal tube seen ending 3-4 cm above the carina. 2. Mild elevation of the left hemidiaphragm. Vascular congestion noted. Increased interstitial markings may reflect mild interstitial edema. Small left pleural effusion noted. Electronically Signed   By: Garald Balding M.D.   On: 06/06/2016 22:59   Dg Chest Port 1 View  06/06/2016  CLINICAL DATA:  55 year old male with respiratory failure status post seizures. Initial encounter. EXAM: PORTABLE CHEST 1 VIEW COMPARISON:  06/05/2016 and earlier. FINDINGS: Portable AP semi upright view at  0607 hours. Endotracheal tube tip remains in good position between the level the clavicles and carina. Enteric tube courses to the abdomen, tip not included. Stable left IJ central line. Mildly lower lung volumes. Mediastinal contours remain normal. No pneumothorax or pleural effusion. Mildly increased pulmonary interstitial markings diffusely over this series of exams. Interval increased bilateral infrahilar opacity. IMPRESSION: 1.  Stable lines and tubes. 2. Increased infrahilar opacity since yesterday, but might reflect atelectasis. Additional mild progression of nonspecific pulmonary interstitial opacity over this series of exams. Developing infection is difficult to exclude. Electronically Signed   By: Genevie Ann M.D.   On: 06/06/2016 07:54  Medications:  I have reviewed the patient's current medications. Scheduled: . antiseptic oral rinse  7 mL Mouth Rinse QID  . chlorhexidine gluconate (SAGE KIT)  15 mL Mouth Rinse BID  . enoxaparin (LOVENOX) injection  40 mg Subcutaneous Q24H  . famotidine  20 mg Per Tube BID  . feeding supplement (PRO-STAT SUGAR FREE 64)  30 mL Per Tube TID  . free water  200 mL Per Tube Q8H  . lacosamide (VIMPAT) IV  100 mg Intravenous Q12H  . lactulose  30 g Per Tube BID  . levETIRAcetam  1,500 mg Intravenous Q12H  . potassium chloride  40 mEq Per Tube Once  . potassium chloride  40 mEq Per Tube TID  . rifaximin  550 mg Per Tube TID  . sodium chloride  250 mL Intravenous Once    Assessment/Plan: Patient remains unresponsive off sedation.  Will rule out subclinical seizure activity now that not sedated.    Recommendations: 1.  Repeat EEG 2.  Continue Vimpat and Keppra at current doses.     LOS: 4 days   Alexis Goodell, MD Neurology [phone] 06/07/2016  10:50 AM

## 2016-06-07 NOTE — Progress Notes (Signed)
Notified Dr. Bard HerbertSimmonds that I will be giving a 3rd dose of PRN medication for seizures since the first seizure-like activity around 11am this morning.  I asked if he would clarify the PRN order for ativan since for frequency it states "as needed."

## 2016-06-08 ENCOUNTER — Inpatient Hospital Stay: Payer: Medicaid Other

## 2016-06-08 LAB — BASIC METABOLIC PANEL
ANION GAP: 5 (ref 5–15)
BUN: 37 mg/dL — ABNORMAL HIGH (ref 6–20)
CO2: 19 mmol/L — ABNORMAL LOW (ref 22–32)
CREATININE: 0.88 mg/dL (ref 0.61–1.24)
Calcium: 9.1 mg/dL (ref 8.9–10.3)
Chloride: 119 mmol/L — ABNORMAL HIGH (ref 101–111)
Glucose, Bld: 98 mg/dL (ref 65–99)
Potassium: 4.1 mmol/L (ref 3.5–5.1)
SODIUM: 143 mmol/L (ref 135–145)

## 2016-06-08 LAB — GLUCOSE, CAPILLARY
GLUCOSE-CAPILLARY: 86 mg/dL (ref 65–99)
GLUCOSE-CAPILLARY: 92 mg/dL (ref 65–99)
GLUCOSE-CAPILLARY: 92 mg/dL (ref 65–99)
GLUCOSE-CAPILLARY: 93 mg/dL (ref 65–99)
Glucose-Capillary: 86 mg/dL (ref 65–99)
Glucose-Capillary: 96 mg/dL (ref 65–99)
Glucose-Capillary: 99 mg/dL (ref 65–99)

## 2016-06-08 LAB — CBC
HCT: 28.1 % — ABNORMAL LOW (ref 40.0–52.0)
Hemoglobin: 9.3 g/dL — ABNORMAL LOW (ref 13.0–18.0)
MCH: 30.4 pg (ref 26.0–34.0)
MCHC: 33 g/dL (ref 32.0–36.0)
MCV: 91.9 fL (ref 80.0–100.0)
PLATELETS: 107 10*3/uL — AB (ref 150–440)
RBC: 3.06 MIL/uL — AB (ref 4.40–5.90)
RDW: 27.3 % — ABNORMAL HIGH (ref 11.5–14.5)
WBC: 11.1 10*3/uL — AB (ref 3.8–10.6)

## 2016-06-08 LAB — HEPATIC FUNCTION PANEL
ALBUMIN: 2 g/dL — AB (ref 3.5–5.0)
ALT: 22 U/L (ref 17–63)
AST: 58 U/L — AB (ref 15–41)
Alkaline Phosphatase: 51 U/L (ref 38–126)
BILIRUBIN DIRECT: 3.2 mg/dL — AB (ref 0.1–0.5)
Indirect Bilirubin: 3.1 mg/dL — ABNORMAL HIGH (ref 0.3–0.9)
TOTAL PROTEIN: 7 g/dL (ref 6.5–8.1)
Total Bilirubin: 6.3 mg/dL — ABNORMAL HIGH (ref 0.3–1.2)

## 2016-06-08 LAB — AMMONIA: Ammonia: 130 umol/L — ABNORMAL HIGH (ref 9–35)

## 2016-06-08 LAB — TRIGLYCERIDES: TRIGLYCERIDES: 82 mg/dL (ref <150)

## 2016-06-08 LAB — PROCALCITONIN: Procalcitonin: 0.24 ng/mL

## 2016-06-08 MED ORDER — VITAL AF 1.2 CAL PO LIQD
1000.0000 mL | ORAL | Status: DC
  Administered 2016-06-08 – 2016-06-10 (×3): 1000 mL

## 2016-06-08 NOTE — Progress Notes (Signed)
Dr. Loman ChromanZeyilkman ordered a brain MRI without contrast for today. MRI informed the charge nurse that they did not have the staff to complete the testing today and would have to hold until Monday. Dr. Herma CarsonZ aware of same.

## 2016-06-08 NOTE — Progress Notes (Addendum)
Dr. Bard HerbertSimmonds was informed that pt's heart rate has been in the 120's. Dr. Bard HerbertSimmonds also made aware that Pt had a urine output of 225mL last night and instructed to bladder scan. Bladder scan revealed 0mL this morning.

## 2016-06-08 NOTE — Progress Notes (Signed)
Spoke with Jon Morgan, Jon Morgan who stated he wants his father, Jon Morgan, transferred to [facility] immediately. Mr. Jon Morgan, Jon Morgan was advised that [facility] and the plan is to transfer his father out as soon as a bed becomes available. Mr. 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 McK[facility] at [facility] contacted at [phone] for an updated status on available beds. McKenzie stated there are no beds available at this time and she and her team will inform us as soon as one is.

## 2016-06-08 NOTE — Progress Notes (Signed)
Towards beginning of shift pt was having some twitching of the L arm and head. Versed given and NP made aware. Propofol restarted per NP. Pt has not had anymore twitching.  [place] donor called for an update. Staff- please keep them updated if any neuro changes occur or if [facility]. [facility] transfer center called-no bed available yet.

## 2016-06-08 NOTE — Progress Notes (Signed)
Nutrition Follow-up  DOCUMENTATION CODES:   Not applicable  INTERVENTION:   Spoke with Dr. Alva Garnet this am, agreeable to titrating up on Vital 1.2 AF tube feeding to a goal rate of 29m/hr. Will recommend increasing by 121mhr every 4 hours until goal rate is met. RD notes diprivan restarted overnight; will monitor rate and adjust goal rate of TF accordingly. Will continue to follow and make recommendations as needed.   NUTRITION DIAGNOSIS:   Inadequate oral intake related to acute illness as evidenced by NPO status, being addressed with TF.   GOAL:   Provide needs based on ASPEN/SCCM guidelines; ongoing  MONITOR:   Vent status, TF tolerance, Weight trends, Labs  REASON FOR ASSESSMENT:   Ventilator, Consult Enteral/tube feeding initiation and management  ASSESSMENT:   [age]o male admitted with acute respiratory failure requiring intubation on 06/03/16, hepatic encephalopathy, seizures. [facility] after admission for acute respiratory failure s/p seizures requiring intubation  Patient remains intubated on ventilator support MV: 14.7 L/min Temp (24hrs), Avg:97.3 F (36.3 C), Min:95.5 F (35.3 C), Max:98.4 F (36.9 C)  Propofol: 5.1 ml/hr currently, restarted overnight   Diet Order:   NPO   Current Nutrition: pt tolerating Vital 1.2AF at 2075mr with Prostat TID and free water of 200m25mD   Gastrointestinal Profile: per RN this am, abdomen less distended, soft with +BS, Flexiseal remains in place with 100mL88mumented at 6am and 50mL 55mat 8am this am.  Multiple loose stools documented overnight.   Medications: Lactulose, KCl, Xifaxan, Levophed discontinued, diprivan Labs: K wdl this am, BUN 37, Cre wdl   Weight Trend since Admission: Filed Weights   06/06/16 1400 06/07/16 0408 06/08/16 0500  Weight: 204 lb 2.3 oz (92.6 kg) 192 lb 10.9 oz (87.4 kg) 197 lb 5 oz (89.5 kg)    Skin:   (stage III sacrum, deep tissue injury  heel)   BMI:  Body mass index is 26.04 kg/(m^2).  Estimated Nutritional Needs:   Kcal:  2163 kcals  Protein:  129-172 g   Fluid:  >/= 2 L  EDUCATION NEEDS:   No education needs identified at this time  AllysoDwyane LuoLDN Pager [phone]/On-Call Pager [phone]

## 2016-06-08 NOTE — Progress Notes (Addendum)
PULMONARY / CRITICAL CARE MEDICINE   Name: Jon PatteeWayne Frankl MRN: [id number] DOB: 1961/01/25    ADMISSION DATE:  06/03/2016  PT PROFILE: 89 M with h/o cirrhosis, CVAs seizure d/o presented to ED with status epilepticus and intubated in ED. Admitted by PCCM service  MAJOR EVENTS/TEST RESULTS: 06/12 Admitted via ED with status epilepticus, VDRF 06/12 CT head: Generalized atrophy with interval decrease in size of the low-attenuation focus seen previously at the high RIGHT frontal lobe compatible with evolving subacute infarction. New areas of low attenuation and loss of gray-white differentiation in the posterior RIGHT parietal and occipital lobes compatible with acute to subacute infarction. 06/13 Neuro consultation: recommended increased dose of Keppra. EEG ordered. MRI ordered 06/13 ammonia level > 500. Lactulose, rifaximin initiated 06/13 EEG: This is an abnormal EEG secondary to general background slowing. This finding may be seen with a diffuse disturbance that is etiologically nonspecific, but is consistent with the patient's current medications and may be related to the patient being post-ictal as well. No epileptiform activity is noted 06/13 MRI: New extensive cortically based restricted diffusion throughout both cerebral hemispheres which may reflect anoxic injury or the sequelae of recent seizure activity. Creutzfeldt-Jakob disease was considered but is felt unlikely given the clinical presentation and rapid development of these findings. New edema in the posteromedial right temporal and right occipital lobes suspicious for subacute PCA territory infarct. Slightly decreased size of subacute right frontoparietal hematoma 06/13 [facility] Awaiting bed availability 06/13 Further seizure despite Keppra, propofol. Midazolam infusion initiated and Vimpat added 06/14 repeat EEG: This is an abnormal electroencephalogram due to the attenuated,  slow background. This activity is consistent with the patient's medications and can be seen in the postictal state, but can be seen with other pathologies as well. Clinical correlation recommended. No epileptiform activity is noted 06/15 Midazolam infusion stopped. Remains comatose on propofol infusion 06/16 Remained comatose. Propofol infusion discontinued. Repeat EEG: This is a technically difficult record due to the predominance of muscle and movement artifact. When able to be visualized the background activity was slow and poorly organized. There was some evidence of generalized polyspike activity that did appear distinct from artifact which is consistent with the patient's history of seizures. There was no evidence of electrographic seizures 06/17 Remained comatose. Neurology suspects "twitching is myoclonus. Repeat MIR brain ordered 06/17 MRI brain:   INDWELLING DEVICES:: ETT 06/12 >>  L IJ CVL 06/13 >>   MICRO DATA: MRSA PCR 06/12 >> NEG  PROCALCITONIN: 06/15: 0.27 06/16: 0.18 06/17: 0.24  ANTIMICROBIALS:    SUBJECTIVE:  RASS -5. Jerky arm and head movements noted; unrelieved with multiple doses of versed. Propofol gtt restarted; much improved. [facility] and indicated still no bed for patient  VITAL SIGNS: BP 138/90 mmHg  Pulse 122  Temp(Src) 98.4 F (36.9 C) (Core (Comment))  Resp 26  Ht 6\' 1"  (1.854 m)  Wt 192 lb 10.9 oz (87.4 kg)  BMI 25.43 kg/m2  SpO2 99%  HEMODYNAMICS:    VENTILATOR SETTINGS: Vent Mode:  [-] PSV FiO2 (%):  [35 %-40 %] 35 % PEEP:  [5 cmH20] 5 cmH20 Pressure Support:  [8 cmH20-13 cmH20] 13 cmH20  INTAKE / OUTPUT: I/O last 3 completed shifts: In: 2981.8 [I.V.:2128.4; NG/GT:518.3; IV Piggyback:335] Out: 2550 [Urine:1750; Emesis/NG output:800]  PHYSICAL EXAMINATION: General: Intubated, comatose Neuro: pupils unreactive, no corneal reflexes  HEENT: NCAT, sclericterus  Cardiovascular:  tachycardic, regular, no M Lungs:Coarse, no  wheezes  Abdomen:  soft, distended, hypoactive BS Ext: warm, no edema  LABS:  BMET  Recent Labs Lab 06/06/16 0435 06/07/16 0431 06/08/16 0456  NA 144 139 143  K 3.4* 3.0* 4.1  CL 121* 115* 119*  CO2 13* 17* 19*  BUN 44* 34* 37*  CREATININE 1.82* 0.88 0.88  GLUCOSE 107* 98 98    Electrolytes  Recent Labs Lab 06/04/16 1126 06/04/16 2020 06/05/16 0504  06/06/16 0435 06/07/16 0431 06/08/16 0456  CALCIUM  --   --  9.0  < > 8.6* 8.5* 9.1  MG 1.8 2.0 1.9  --   --   --   --   PHOS 3.5 5.7* 7.0*  --   --   --   --   < > = values in this interval not displayed.  CBC  Recent Labs Lab 06/05/16 0504 06/06/16 0435 06/08/16 0456  WBC 10.9* 10.2 11.1*  HGB 9.6* 9.0* 9.3*  HCT 29.1* 27.3* 28.1*  PLT 164 142* 107*    Coag's  Recent Labs Lab 06/07/16 0431  APTT 56*  INR 2.19    Sepsis Markers  Recent Labs Lab 06/06/16 0435 06/07/16 1212 06/08/16 0456  PROCALCITON 0.27 0.18 0.24    ABG  Recent Labs Lab 06/04/16 0445 06/04/16 1554 06/06/16 2032  PHART 7.62* 7.42 7.43  PCO2ART <19* 25* 27*  PO2ART 148* 106 72*    Liver Enzymes  Recent Labs Lab 06/06/16 0435 06/07/16 0431 06/08/16 0456  AST 59* 60* 58*  ALT ALKPHOS 49 51 51  BILITOT 4.4* 5.4* 6.3*  ALBUMIN 2.1* 2.1* 2.0*    Cardiac Enzymes  Recent Labs Lab 06/03/16 1416  TROPONINI <0.03    Glucose  Recent Labs Lab 06/07/16 0733 06/07/16 1130 06/07/16 1542 06/07/16 1944 06/08/16 0030 06/08/16 0409  GLUCAP 84 85 73 85 92 92   AMMONIA: 130    NEUROLOGIC A:   Status epilepticus, refractory Hepatic encephalopathy 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 Coma Neurogenic hypothermia and fevers Myoclonus  Poor prognosis for neurological recovery P:   Neurology following Continue Keppra 1500 mg IV BID, Vimpat 100 mg IV BID Daily WUA  Cont lactulose and rifaximin Monitor ammonia level daily until normal Monitor and treat  neurogenic fevers per neurology  PULMONARY A: VDRF due to AMS Primary respiratory alkalosis due to hepatic encephalopathy P:   Cont full vent support - settings reviewed and/or adjusted Cont vent bundle Daily SBT if/when meets criteria  CARDIOVASCULAR A:  History of hypertension Tachycardia P: Monitor - MAP goal > 70 mmHg  Off norepinephrine infusion; resume if needed to maintain MAP goal  RENAL A:   AKI, resolved Recurrent hypokalemia P:   Monitor BMET intermittently Monitor I/Os Correct electrolytes as indicated Continue IVFs  GASTROINTESTINAL A:   Severe cirrhosis Markedly elevated ammonia P:   SUP: enteral famotidine Cont TF per protocol  HEMATOLOGIC A:   Anemia without acute blood loss 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 Awaiting bed assignment from [facility] ANP-BC Pulmonary and Critical Care Medicine [facility] Pager [phone] or [phone]  06/08/2016   PCCM ATTENDING ATTESTATION: I have evaluated patient with ANP Luci Bank, reviewed database in its entirety and reviewed care plan in detail. In addition, this patient was discussed with Neurology and with bedside RN. I have again tried to reach pt's son without success. We are awaiting transfer to [facility] when they have a bed available    Billy Fischer, MD PCCM service Mobile [phone] Pager [phone]

## 2016-06-08 NOTE — Progress Notes (Signed)
Pt temp was low-95.76F. Bare hugger applied. It was removed when pt temp reached 96.8. That's when HR started to increase from 100s to 120s. Pt HR now sustaining in the 120s. NP notified. No tx unless HR reaches 140s per NP. Will continue to monitor. Temp has also continued to increase each hour.

## 2016-06-08 NOTE — Progress Notes (Signed)
Subjective: Patient remains intubated. On propofol for vent synchrony.   Overbreathing the vent.  EEG no  Seizures yesterday.    Objective: Current vital signs: BP 138/86 mmHg  Pulse 110  Temp(Src) 97.9 F (36.6 C) (Core (Comment))  Resp 22  Ht 6' 1" (1.854 m)  Wt 89.5 kg (197 lb 5 oz)  BMI 26.04 kg/m2  SpO2 100% Vital signs in last 24 hours: Temp:  [95.5 F (35.3 C)-98.4 F (36.9 C)] 97.9 F (36.6 C) (06/17 0824) Pulse Rate:  [100-122] 110 (06/17 0824) Resp:  [18-28] 22 (06/17 0824) BP: (86-165)/(53-100) 138/86 mmHg (06/17 0800) SpO2:  [98 %-100 %] 100 % (06/17 0824) FiO2 (%):  [35 %-40 %] 35 % (06/17 0807) Weight:  [89.5 kg (197 lb 5 oz)] 89.5 kg (197 lb 5 oz) (06/17 0500)  Intake/Output from previous day: 06/16 0701 - 06/17 0700 In: 1018.6 [I.V.:61.6; NG/GT:647; IV Piggyback:310] Out: 783 [Urine:683; Stool:100] Intake/Output this shift: Total I/O In: 50.2 [I.V.:10.2; NG/GT:40] Out: 90 [Urine:40; Stool:50] Nutritional status:    Neurologic Exam: Mental Status: Patient does not respond to verbal stimuli. Does not respond to deep sternal rub. Does not follow commands. No verbalizations noted.  Cranial Nerves: II: patient does not respond confrontation bilaterally, pupils right 4 mm, left 4 mm,and sluggishly reactive bilaterally III,IV,VI: doll's response absent bilaterally.  V,VII: corneal reflex absent on the right and weak on the left  VIII: patient does not respond to verbal stimuli IX,X: gag reflex reduced, XI: trapezius strength unable to test bilaterally XII: tongue strength unable to test Motor: Extremities flaccid throughout. No spontaneous movement noted. No purposeful movements noted. Sensory: Does not respond to noxious stimuli in any extremity.  Lab Results: Basic Metabolic Panel:  Recent Labs Lab 06/03/16 1416 06/04/16 0603 06/04/16 1126 06/04/16 2020 06/05/16 0504 06/05/16 1617 06/06/16 0435 06/07/16 0431 06/08/16 0456  NA 135  140  --   --  144 142 144 139 143  K 4.3 3.5  --   --  5.4* 4.8 3.4* 3.0* 4.1  CL 112* 117*  --   --  121* 118* 121* 115* 119*  CO2 17* 15*  --   --  18* 14* 13* 17* 19*  GLUCOSE 95 85  --   --  81 95 107* 98 98  BUN 9 14  --   --  32* 39* 44* 34* 37*  CREATININE 0.40* 0.57*  --   --  1.84* 2.27* 1.82* 0.88 0.88  CALCIUM 8.9 8.8*  --   --  9.0 8.9 8.6* 8.5* 9.1  MG 1.8 1.8 1.8 2.0 1.9  --   --   --   --   PHOS  --  3.3 3.5 5.7* 7.0*  --   --   --   --     Liver Function Tests:  Recent Labs Lab 06/03/16 1416 06/05/16 0504 06/06/16 0435 06/07/16 0431 06/08/16 0456  AST 67* 60* 59* 60* 58*  ALT 27 25 25 23 22  ALKPHOS 72 52 49 51 51  BILITOT 4.7* 4.8* 4.4* 5.4* 6.3*  PROT 7.6 7.1 7.1 7.0 7.0  ALBUMIN 2.3* 2.2* 2.1* 2.1* 2.0*   No results for input(s): LIPASE, AMYLASE in the last 168 hours.  Recent Labs Lab 06/06/16 0435 06/07/16 0431 06/08/16 0456  AMMONIA 210* 140* 130*    CBC:  Recent Labs Lab 06/03/16 1416 06/04/16 0603 06/05/16 0504 06/06/16 0435 06/08/16 0456  WBC 6.6 6.7 10.9* 10.2 11.1*  NEUTROABS 3.9  --   --   --   --     HGB 10.2* 9.8* 9.6* 9.0* 9.3*  HCT 30.8* 29.0* 29.1* 27.3* 28.1*  MCV 88.8 88.2 91.4 92.0 91.9  PLT 173 163 164 142* 107*    Cardiac Enzymes:  Recent Labs Lab 06/03/16 1416  TROPONINI <0.03    Lipid Panel:  Recent Labs Lab 06/04/16 1126 06/08/16 0456  TRIG 58 82    CBG:  Recent Labs Lab 06/07/16 1542 06/07/16 1944 06/08/16 0030 06/08/16 0409 06/08/16 0722  GLUCAP 73 85 92 92 16    Microbiology: Results for orders placed or performed during the hospital encounter of 06/03/16  MRSA PCR Screening     Status: None   Collection Time: 06/03/16  4:36 PM  Result Value Ref Range Status   MRSA by PCR NEGATIVE NEGATIVE Final    Comment:        The GeneXpert MRSA Assay (FDA approved for NASAL specimens only), is one component of a comprehensive MRSA colonization surveillance program. It is not intended to  diagnose MRSA infection nor to guide or monitor treatment for MRSA infections.     Coagulation Studies:  Recent Labs  06/07/16 0431  LABPROT 24.2*  INR 2.19    Imaging: Dg Abd 1 View  06/06/2016  CLINICAL DATA:  Abdominal distention EXAM: ABDOMEN - 1 VIEW COMPARISON:  May 21, 2016 FINDINGS: Nasogastric tube tip and side port are in the stomach. There are multiple loops of dilated bowel. Air is seen in the colon and rectum. A rectal thermometer is present. No free air is evident. Lung bases are clear. IMPRESSION: Bowel dilatation, primarily involving colon. Nasogastric tube tip and side port in stomach. No free air. Suspect a degree of colonic ileus as most likely etiology. Note that there is air in the rectum. Electronically Signed   By: Lowella Grip III M.D.   On: 06/06/2016 20:08   Dg Chest Port 1 View  06/08/2016  CLINICAL DATA:  Respiratory failure EXAM: PORTABLE CHEST 1 VIEW COMPARISON:  06/06/2016 chest radiograph. FINDINGS: Endotracheal tube tip is 3.5 cm above the carina. Enteric tube enters stomach with the tip not seen on this image. Left internal jugular central venous catheter terminates in the upper third of the superior vena cava. Stable cardiomediastinal silhouette with normal heart size. No pneumothorax. Stable small left pleural effusion. No pulmonary edema. Patchy left lower lobe consolidation appears stable. IMPRESSION: Stable patchy left lower lobe opacity and small left pleural effusion. Support structures as described. Electronically Signed   By: Ilona Sorrel M.D.   On: 06/08/2016 08:55   Dg Chest Port 1 View  06/06/2016  CLINICAL DATA:  Acute onset of hypoxia.  Initial encounter. EXAM: PORTABLE CHEST 1 VIEW COMPARISON:  Chest radiograph performed earlier today at 6:07 a.m. FINDINGS: The patient's endotracheal tube is seen ending 3-4 cm above the carina. An enteric tube is noted extending below the diaphragm. A left IJ line is noted ending about the mid SVC. There  is mild elevation of the left hemidiaphragm. Vascular congestion is noted. Increased interstitial markings may reflect mild interstitial edema. A small left pleural effusion is noted. No pneumothorax is seen. The cardiomediastinal silhouette is normal in size. No acute osseous abnormalities are identified. IMPRESSION: 1. Endotracheal tube seen ending 3-4 cm above the carina. 2. Mild elevation of the left hemidiaphragm. Vascular congestion noted. Increased interstitial markings may reflect mild interstitial edema. Small left pleural effusion noted. Electronically Signed   By: Garald Balding M.D.   On: 06/06/2016 22:59    Medications:  I have reviewed  the patient's current medications. Scheduled: . antiseptic oral rinse  7 mL Mouth Rinse QID  . chlorhexidine gluconate (SAGE KIT)  15 mL Mouth Rinse BID  . enoxaparin (LOVENOX) injection  40 mg Subcutaneous Q24H  . famotidine  20 mg Per Tube BID  . feeding supplement (PRO-STAT SUGAR FREE 64)  30 mL Per Tube TID  . free water  200 mL Per Tube Q8H  . lacosamide (VIMPAT) IV  200 mg Intravenous Q12H  . lactulose  30 g Per Tube BID  . levETIRAcetam  1,500 mg Intravenous Q12H  . potassium chloride  40 mEq Per Tube Once  . potassium chloride  40 mEq Per Tube TID  . rifaximin  550 mg Per Tube TID  . sodium chloride  250 mL Intravenous Once    Assessment/Plan: EEG no active seizure activity.  I believe pt is having myoclonic activity.  At this point will hold off VPA due to cirrhosis and hyperammonemia.   MRI brain from 6/13 reviewed with restricted diffusion changes all along the cortex which can be seen in severe hyperammonemia or anoxia.    - Con't sedation until MRI can be repeated today to look if the diffusion changes are reversible.  - I strongly believe this will be a very poor prognosis for any recovery.  - Trial of titration off propofol once MRI is repeated, but will all depend on vent synchrony as pt will likely have respiratory acidosis  due to tachypnea. - Ammonia improving at 130   - No family at bedside.   This pt is critically ill in the setting of diffuse cerebral injury,  cirrhosis, respiratory failure on sedation. Total 35 minutes of critical care spent.    ZEYLIKMAN, YURIY    

## 2016-06-08 NOTE — Progress Notes (Signed)
LCSW met with CC nurse, patient remains on vent and is to transfer to [facility] as per patients son request  LCSW unable to assess at this time. CC nurse will advise SW if needs arise.  Anshi Jalloh LCSW [phone].

## 2016-06-09 LAB — BASIC METABOLIC PANEL
ANION GAP: 4 — AB (ref 5–15)
BUN: 38 mg/dL — AB (ref 6–20)
CHLORIDE: 120 mmol/L — AB (ref 101–111)
CO2: 19 mmol/L — ABNORMAL LOW (ref 22–32)
Calcium: 9.2 mg/dL (ref 8.9–10.3)
Creatinine, Ser: 0.73 mg/dL (ref 0.61–1.24)
GFR calc Af Amer: >60 mL/min (ref >60)
GLUCOSE: 97 mg/dL (ref 65–99)
POTASSIUM: 4.9 mmol/L (ref 3.5–5.1)
Sodium: 143 mmol/L (ref 135–145)

## 2016-06-09 LAB — GLUCOSE, CAPILLARY
GLUCOSE-CAPILLARY: 88 mg/dL (ref 65–99)
GLUCOSE-CAPILLARY: 74 mg/dL (ref 65–99)
GLUCOSE-CAPILLARY: 74 mg/dL (ref 65–99)
Glucose-Capillary: 84 mg/dL (ref 65–99)
Glucose-Capillary: 99 mg/dL (ref 65–99)

## 2016-06-09 LAB — PROCALCITONIN: Procalcitonin: 0.14 ng/mL

## 2016-06-09 MED ORDER — HYDRALAZINE HCL 20 MG/ML IJ SOLN
10.0000 mg | INTRAMUSCULAR | Status: DC | PRN
  Administered 2016-06-09: 20 mg via INTRAVENOUS
  Administered 2016-06-09: 10 mg via INTRAVENOUS
  Administered 2016-06-12: 20 mg via INTRAVENOUS
  Filled 2016-06-09 (×3): qty 1

## 2016-06-09 MED ORDER — METOPROLOL TARTRATE 5 MG/5ML IV SOLN
2.5000 mg | INTRAVENOUS | Status: DC | PRN
  Administered 2016-06-09: 5 mg via INTRAVENOUS
  Administered 2016-06-09: 2.5 mg via INTRAVENOUS
  Administered 2016-06-10 – 2016-06-12 (×3): 5 mg via INTRAVENOUS
  Filled 2016-06-09 (×5): qty 5

## 2016-06-09 MED ORDER — LORAZEPAM 2 MG/ML IJ SOLN
1.0000 mg | INTRAMUSCULAR | Status: DC | PRN
  Administered 2016-06-10: 1 mg via INTRAVENOUS
  Filled 2016-06-09 (×2): qty 1

## 2016-06-09 MED ORDER — MIDAZOLAM HCL 2 MG/2ML IJ SOLN
2.0000 mg | Freq: Once | INTRAMUSCULAR | Status: AC
  Administered 2016-06-09: 2 mg via INTRAVENOUS
  Filled 2016-06-09: qty 2

## 2016-06-09 NOTE — Progress Notes (Signed)
LCSW received a call from patient son and he will be in hospital in 5 minutes. LCSW will collect data and enter information the next day for assessment ,due to time constraints. Teasha Murrillo LCSW

## 2016-06-09 NOTE — Progress Notes (Signed)
LCSW consulted CCU nurse and was informed the son did call back yesterday but was not able to communicate as he was at work  Engineer, agricultural( [employer]). LCSW inable to assess patient and he is still on vent. LCSW called son and left voice mail message and awaiting a call back.  Delta Air LinesClaudine Kahleel Fadeley LCSW [phone]

## 2016-06-09 NOTE — Progress Notes (Signed)
Noted patient to be diaphoretic with increased respirations during my vent rounds.  Asked NP to assess patient with me to determine needs for him.  Patient on PSV for several days. We both decided best to rest patient at this point due to his condition. Placed him back on tidal volume and rate.  See flowsheet for time frame. Airway was suctioned for a lot of secretions. Patient tolerated vent change well.

## 2016-06-09 NOTE — Progress Notes (Signed)
Pt's respiratory rate increased to 44 breaths/min and pt was breathing over the ventilator. Pt has PRN ativan ordered for seizures. Dr. Bard HerbertSimmonds made aware and gave instructions not to suppress pt's respiratory rate. No intervention at this time, will continue to monitor.

## 2016-06-09 NOTE — Plan of Care (Signed)
Problem: Education: Goal: Knowledge of [facility] Education information/materials will improve Outcome: Not Progressing Unable to educate pt at this time, family unwilling to communicate.   Problem: Activity: Goal: Risk for activity intolerance will decrease Outcome: Not Progressing Pt is not active at this time

## 2016-06-09 NOTE — Progress Notes (Signed)
Subjective: Patient remains intubated and sedated on Propofol.  No seizure or myoclonic activity noted at this time.    Objective: Current vital signs: BP 161/96 mmHg  Pulse 110  Temp(Src) 98.1 F (36.7 C) (Core (Comment))  Resp 22  Ht '6\' 1"'  (1.854 m)  Wt 90.8 kg (200 lb 2.8 oz)  BMI 26.42 kg/m2  SpO2 100% Vital signs in last 24 hours: Temp:  [95.5 F (35.3 C)-98.1 F (36.7 C)] 98.1 F (36.7 C) (06/18 1000) Pulse Rate:  [94-110] 110 (06/18 1000) Resp:  [17-26] 22 (06/18 1000) BP: (134-163)/(77-100) 161/96 mmHg (06/18 1000) SpO2:  [99 %-100 %] 100 % (06/18 1000) FiO2 (%):  [35 %] 35 % (06/18 0800) Weight:  [90.8 kg (200 lb 2.8 oz)] 90.8 kg (200 lb 2.8 oz) (06/18 0500)  Intake/Output from previous day: 06/17 0701 - 06/18 0700 In: 1801.1 [I.V.:122.4; NG/GT:1518.7; IV Piggyback:160] Out: 2122 [Urine:1070; Stool:100] Intake/Output this shift: Total I/O In: 210.2 [I.V.:10.2; NG/GT:200] Out: 220 [Urine:220] Nutritional status:    Neurologic Exam: Patient does not respond to verbal stimuli. Does not respond to deep sternal rub. Does not follow commands. No verbalizations noted.  Cranial Nerves: II: patient does not respond confrontation bilaterally, pupils right 4 mm, left 4 mm,and reactive bilaterally III,IV,VI: doll's response absent bilaterally.  V,VII: corneal reflex reduced but better on the right  VIII: patient does not respond to verbal stimuli IX,X: gag reflex reduced, XI: trapezius strength unable to test bilaterally XII: tongue strength unable to test Motor: Extremities flaccid throughout. No spontaneous movement noted. No purposeful movements noted. Sensory: Does not respond to noxious stimuli in any extremity.  Lab Results: Basic Metabolic Panel:  Recent Labs Lab 06/03/16 1416 06/04/16 0603 06/04/16 1126 06/04/16 2020 06/05/16 0504 06/05/16 1617 06/06/16 0435 06/07/16 0431 06/08/16 0456 06/09/16 0544  NA 135 140  --   --  144 142 144 139  143 143  K 4.3 3.5  --   --  5.4* 4.8 3.4* 3.0* 4.1 4.9  CL 112* 117*  --   --  121* 118* 121* 115* 119* 120*  CO2 17* 15*  --   --  18* 14* 13* 17* 19* 19*  GLUCOSE 95 85  --   --  81 95 107* 98 98 97  BUN 9 14  --   --  32* 39* 44* 34* 37* 38*  CREATININE 0.40* 0.57*  --   --  1.84* 2.27* 1.82* 0.88 0.88 0.73  CALCIUM 8.9 8.8*  --   --  9.0 8.9 8.6* 8.5* 9.1 9.2  MG 1.8 1.8 1.8 2.0 1.9  --   --   --   --   --   PHOS  --  3.3 3.5 5.7* 7.0*  --   --   --   --   --     Liver Function Tests:  Recent Labs Lab 06/03/16 1416 06/05/16 0504 06/06/16 0435 06/07/16 0431 06/08/16 0456  AST 67* 60* 59* 60* 58*  ALT '27 25 25 23 22  ' ALKPHOS 72 52 49 51 51  BILITOT 4.7* 4.8* 4.4* 5.4* 6.3*  PROT 7.6 7.1 7.1 7.0 7.0  ALBUMIN 2.3* 2.2* 2.1* 2.1* 2.0*   No results for input(s): LIPASE, AMYLASE in the last 168 hours.  Recent Labs Lab 06/06/16 0435 06/07/16 0431 06/08/16 0456  AMMONIA 210* 140* 130*    CBC:  Recent Labs Lab 06/03/16 1416 06/04/16 0603 06/05/16 0504 06/06/16 0435 06/08/16 0456  WBC 6.6 6.7 10.9* 10.2 11.1*  NEUTROABS 3.9  --   --   --   --   HGB 10.2* 9.8* 9.6* 9.0* 9.3*  HCT 30.8* 29.0* 29.1* 27.3* 28.1*  MCV 88.8 88.2 91.4 92.0 91.9  PLT 173 163 164 142* 107*    Cardiac Enzymes:  Recent Labs Lab 06/03/16 1416  TROPONINI <0.03    Lipid Panel:  Recent Labs Lab 06/04/16 1126 06/08/16 0456  TRIG 58 82    CBG:  Recent Labs Lab 06/08/16 1701 06/08/16 1954 06/08/16 2351 06/09/16 0428 06/09/16 0735  GLUCAP 99 93 86 84 88    Microbiology: Results for orders placed or performed during the hospital encounter of 06/03/16  MRSA PCR Screening     Status: None   Collection Time: 06/03/16  4:36 PM  Result Value Ref Range Status   MRSA by PCR NEGATIVE NEGATIVE Final    Comment:        The GeneXpert MRSA Assay (FDA approved for NASAL specimens only), is one component of a comprehensive MRSA colonization surveillance program. It is  not intended to diagnose MRSA infection nor to guide or monitor treatment for MRSA infections.     Coagulation Studies:  Recent Labs  06/07/16 0431  LABPROT 24.2*  INR 2.19    Imaging: Dg Chest Port 1 View  06/08/2016  CLINICAL DATA:  Respiratory failure EXAM: PORTABLE CHEST 1 VIEW COMPARISON:  06/06/2016 chest radiograph. FINDINGS: Endotracheal tube tip is 3.5 cm above the carina. Enteric tube enters stomach with the tip not seen on this image. Left internal jugular central venous catheter terminates in the upper third of the superior vena cava. Stable cardiomediastinal silhouette with normal heart size. No pneumothorax. Stable small left pleural effusion. No pulmonary edema. Patchy left lower lobe consolidation appears stable. IMPRESSION: Stable patchy left lower lobe opacity and small left pleural effusion. Support structures as described. Electronically Signed   By: Ilona Sorrel M.D.   On: 06/08/2016 08:55    Medications:  I have reviewed the patient's current medications. Scheduled: . antiseptic oral rinse  7 mL Mouth Rinse QID  . chlorhexidine gluconate (SAGE KIT)  15 mL Mouth Rinse BID  . enoxaparin (LOVENOX) injection  40 mg Subcutaneous Q24H  . famotidine  20 mg Per Tube BID  . feeding supplement (PRO-STAT SUGAR FREE 64)  30 mL Per Tube TID  . free water  200 mL Per Tube Q8H  . lacosamide (VIMPAT) IV  200 mg Intravenous Q12H  . lactulose  30 g Per Tube BID  . levETIRAcetam  1,500 mg Intravenous Q12H  . rifaximin  550 mg Per Tube TID  . sodium chloride  250 mL Intravenous Once    Assessment/Plan: Patient on Diprovan.  On Vimpat 241m q 12hour and Keppra 15059mq 12 hours.  EEG's have showed no evidence of subclinical seizure activity.  Ammonia improving.  Patient breathing some over the ventilator.  Prognosis remains poor.    Recommendations: 1.  Continue anticonvulsant therapy at current dose 2.  Repeat MRI, may be performed on Monday.    LOS: 6 days   LeAlexis GoodellMD Neurology [phone]/18/2017  10:55 AM

## 2016-06-09 NOTE — Progress Notes (Signed)
PULMONARY / CRITICAL CARE MEDICINE   Name: Jon Morgan MRN: [id number] DOB: [birth year]    ADMISSION DATE:  06/03/2016  PT PROFILE: 84 M with h/o cirrhosis, CVAs seizure d/o presented to ED with status epilepticus and intubated in ED. Admitted by PCCM service  MAJOR EVENTS/TEST RESULTS: 06/12 Admitted via ED with status epilepticus, VDRF 06/12 CT head: Generalized atrophy with interval decrease in size of the low-attenuation focus seen previously at the high RIGHT frontal lobe compatible with evolving subacute infarction. New areas of low attenuation and loss of gray-white differentiation in the posterior RIGHT parietal and occipital lobes compatible with acute to subacute infarction. 06/13 Neuro consultation: recommended increased dose of Keppra. EEG ordered. MRI ordered 06/13 ammonia level > 500. Lactulose, rifaximin initiated 06/13 EEG: This is an abnormal EEG secondary to general background slowing. This finding may be seen with a diffuse disturbance that is etiologically nonspecific, but is consistent with the patient's current medications and may be related to the patient being post-ictal as well. No epileptiform activity is noted 06/13 MRI: New extensive cortically based restricted diffusion throughout both cerebral hemispheres which may reflect anoxic injury or the sequelae of recent seizure activity. Creutzfeldt-Jakob disease was considered but is felt unlikely given the clinical presentation and rapid development of these findings. New edema in the posteromedial right temporal and right occipital lobes suspicious for subacute PCA territory infarct. Slightly decreased size of subacute right frontoparietal hematoma 06/13 Pt's son requesting transfer to [facility]. Dr Donnie Aho @ [facility] accepted pt. Awaiting bed availability 06/13 Further seizure despite Keppra, propofol. Midazolam infusion initiated and Vimpat added 06/14 repeat EEG: This is an abnormal electroencephalogram due to the attenuated,  slow background. This activity is consistent with the patient's medications and can be seen in the postictal state, but can be seen with other pathologies as well. Clinical correlation recommended. No epileptiform activity is noted 06/15 Midazolam infusion stopped. Remains comatose on propofol infusion 06/16 Remained comatose. Propofol infusion discontinued. Repeat EEG: This is a technically difficult record due to the predominance of muscle and movement artifact. When able to be visualized the background activity was slow and poorly organized. There was some evidence of generalized polyspike activity that did appear distinct from artifact which is consistent with the patient's history of seizures. There was no evidence of electrographic seizures 06/17 Remained comatose. Neurology suspects "twitching is myoclonus. Repeat MIR brain ordered - cannot be done until 06/19 06/18 remains comatose. Periods of hypertension - PRN hydralazine ordered  INDWELLING DEVICES:: ETT 06/12 >>  L IJ CVL 06/13 >>   MICRO DATA: MRSA PCR 06/12 >> NEG  PROCALCITONIN: 06/15: 0.27 06/16: 0.18 06/17: 0.24 06/18: 0.14  ANTIMICROBIALS:    SUBJECTIVE:  Comatose  VITAL SIGNS: BP 163/99 mmHg  Pulse 107  Temp(Src) 97.3 F (36.3 C) (Core (Comment))  Resp 22  Ht 6\' 1"  (1.854 m)  Wt 200 lb 2.8 oz (90.8 kg)  BMI 26.42 kg/m2  SpO2 100%  HEMODYNAMICS:    VENTILATOR SETTINGS: Vent Mode:  [-] PSV FiO2 (%):  [35 %] 35 % PEEP:  [5 cmH20] 5 cmH20 Pressure Support:  [8 cmH20] 8 cmH20  INTAKE / OUTPUT: I/O last 3 completed shifts: In: 2545 [I.V.:164.3; NG/GT:1945.7; IV Piggyback:435] Out: 1503 [Urine:1303; Stool:200]  PHYSICAL EXAMINATION: General: Intubated, comatose Neuro: pupils equal, no corneal reflexes  HEENT: NCAT, sclericterus  Cardiovascular: tachycardic, regular, no M Lungs:Coarse, no wheezes  Abdomen:  soft, distended, hypoactive BS Ext: warm, no edema  LABS:  BMET  Recent Labs Lab  06/07/16 0431 06/08/16 0456 06/09/16 0544  NA 139 143 143  K 3.0* 4.1 4.9  CL 115* 119* 120*  CO2 17* 19* 19*  BUN 34* 37* 38*  CREATININE 0.88 0.88 0.73  GLUCOSE 98 98 97    Electrolytes  Recent Labs Lab 06/04/16 1126 06/04/16 2020 06/05/16 0504  06/07/16 0431 06/08/16 0456 06/09/16 0544  CALCIUM  --   --  9.0  < > 8.5* 9.1 9.2  MG 1.8 2.0 1.9  --   --   --   --   PHOS 3.5 5.7* 7.0*  --   --   --   --   < > = values in this interval not displayed.  CBC  Recent Labs Lab 06/05/16 0504 06/06/16 0435 06/08/16 0456  WBC 10.9* 10.2 11.1*  HGB 9.6* 9.0* 9.3*  HCT 29.1* 27.3* 28.1*  PLT 164 142* 107*    Coag's  Recent Labs Lab 06/07/16 0431  APTT 56*  INR 2.19    Sepsis Markers  Recent Labs Lab 06/07/16 1212 06/08/16 0456 06/09/16 0544  PROCALCITON 0.18 0.24 0.14    ABG  Recent Labs Lab 06/04/16 0445 06/04/16 1554 06/06/16 2032  PHART 7.62* 7.42 7.43  PCO2ART <19* 25* 27*  PO2ART 148* 106 72*    Liver Enzymes  Recent Labs Lab 06/06/16 0435 06/07/16 0431 06/08/16 0456  AST 59* 60* 58*  ALT 25 23 22   ALKPHOS 49 51 51  BILITOT 4.4* 5.4* 6.3*  ALBUMIN 2.1* 2.1* 2.0*    Cardiac Enzymes  Recent Labs Lab 06/03/16 1416  TROPONINI <0.03    Glucose  Recent Labs Lab 06/08/16 1141 06/08/16 1701 06/08/16 1954 06/08/16 2351 06/09/16 0428 06/09/16 0735  GLUCAP 96 99 93 86 84 88   AMMONIA:     NEUROLOGIC A:   Status epilepticus, refractory Hepatic encephalopathy 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  Poor prognosis for neurological recovery P:   Neurology following Continue Keppra 1500 mg IV BID, Vimpat 100 mg IV BID Cont lactulose and rifaximin Daily WUA  Monitor ammonia level intermitttently  PULMONARY A: VDRF due to AMS Primary respiratory alkalosis due to hepatic encephalopathy P:   Cont vent support - settings reviewed and/or adjusted Cont vent bundle Daily SBT if/when meets  criteria  CARDIOVASCULAR A:  History of hypertension Tachycardia Hypotension - resolved Hypertension Sinus tachycardia P: MAP goal > 70 mmHg  PRN hydralazine to maintain SBP < 160 mmHg PRN metoprolol to maintain HR < 115/min  RENAL A:   AKI, resolved Recurrent hypokalemia P:   Monitor BMET intermittently Monitor I/Os Correct electrolytes as indicated Continue IVFs  GASTROINTESTINAL A:   Severe cirrhosis Markedly elevated ammonia P:   SUP: enteral famotidine Cont TF per protocol  HEMATOLOGIC A:   Anemia without acute blood loss 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 I have not been able to speak to him all week though he has contacted the ICU a couple times by phone (when I was not present on the unit).  He has been demanding, reportedly insisted on transfer to [facility] "immediately", and threatening lawsuits. It has been explained to him that we cannot control when a [facility] bed is available    Magdalene S. [facility] ANP-BC Pulmonary and Critical Care Medicine [facility] Pager [phone] or [phone]  06/09/2016   PCCM ATTENDING ATTESTATION: I have evaluated patient with ANP Luci Bank, reviewed database in its entirety and reviewed care plan in detail. In addition, this patient was discussed with Neurology and with bedside RN.    Billy Fischer, MD PCCM service Mobile [phone] Pager [phone]

## 2016-06-09 NOTE — Progress Notes (Signed)
It was reported that pt was breathing over the vent on day shift. The propofol was discontinued, but metoprolol and hydralazine prn was ordered for increased HR and BP. NP inquired about propofol being stopped and if pt has prn Versed. Versed was also discontinued. NP reported that RT put pt back on the vent at this time. HR in the 110s.

## 2016-06-09 NOTE — Progress Notes (Signed)
Pt has had no change in level of care or level of responsiveness. Remains unresponsive. CDS called to inquire about pt condition and [facility] transport center called last PM and this AM Kennon Rounds(Sally) to see if pt still needs a bed, and if there was any change in level of care.  Pt remains on waiting list. Son did not call this shift to inquire about his father's condition or bed availability. Continue on Propofol at 10 mcg. Foley and rectal tube patent and draining. No s/s pain or discomfort. Has been ST on the monitor.  No seizure activity or involuntary movements noted.

## 2016-06-10 ENCOUNTER — Inpatient Hospital Stay: Payer: Medicaid Other

## 2016-06-10 LAB — BASIC METABOLIC PANEL
Anion gap: 4 — ABNORMAL LOW (ref 5–15)
BUN: 42 mg/dL — AB (ref 6–20)
CALCIUM: 10 mg/dL (ref 8.9–10.3)
CHLORIDE: 125 mmol/L — AB (ref 101–111)
CO2: 16 mmol/L — ABNORMAL LOW (ref 22–32)
CREATININE: 0.89 mg/dL (ref 0.61–1.24)
GFR calc Af Amer: >60 mL/min (ref >60)
Glucose, Bld: 104 mg/dL — ABNORMAL HIGH (ref 65–99)
Potassium: 5 mmol/L (ref 3.5–5.1)
SODIUM: 145 mmol/L (ref 135–145)

## 2016-06-10 LAB — HEPATIC FUNCTION PANEL
ALT: 29 U/L (ref 17–63)
AST: 86 U/L — AB (ref 15–41)
Albumin: 2.1 g/dL — ABNORMAL LOW (ref 3.5–5.0)
Alkaline Phosphatase: 76 U/L (ref 38–126)
BILIRUBIN DIRECT: 2.5 mg/dL — AB (ref 0.1–0.5)
Indirect Bilirubin: 2.4 mg/dL — ABNORMAL HIGH (ref 0.3–0.9)
TOTAL PROTEIN: 7.4 g/dL (ref 6.5–8.1)
Total Bilirubin: 4.9 mg/dL — ABNORMAL HIGH (ref 0.3–1.2)

## 2016-06-10 LAB — BLOOD GAS, ARTERIAL
Acid-base deficit: 6.6 mmol/L — ABNORMAL HIGH (ref 0.0–2.0)
BICARBONATE: 16.3 meq/L — AB (ref 21.0–28.0)
FIO2: 0.35
Mechanical Rate: 16
O2 SAT: 99.3 %
PATIENT TEMPERATURE: 37
PCO2 ART: 24 mmHg — AB (ref 32.0–48.0)
PEEP/CPAP: 5 cmH2O
PO2 ART: 141 mmHg — AB (ref 83.0–108.0)
VT: 550 mL
pH, Arterial: 7.44 (ref 7.350–7.450)

## 2016-06-10 LAB — GLUCOSE, CAPILLARY
Glucose-Capillary: 104 mg/dL — ABNORMAL HIGH (ref 65–99)
Glucose-Capillary: 81 mg/dL (ref 65–99)
Glucose-Capillary: 82 mg/dL (ref 65–99)
Glucose-Capillary: 93 mg/dL (ref 65–99)

## 2016-06-10 LAB — CBC
HCT: 30.3 % — ABNORMAL LOW (ref 40.0–52.0)
Hemoglobin: 9.6 g/dL — ABNORMAL LOW (ref 13.0–18.0)
MCH: 30.7 pg (ref 26.0–34.0)
MCHC: 31.8 g/dL — ABNORMAL LOW (ref 32.0–36.0)
MCV: 96.7 fL (ref 80.0–100.0)
PLATELETS: 111 10*3/uL — AB (ref 150–440)
RBC: 3.13 MIL/uL — ABNORMAL LOW (ref 4.40–5.90)
RDW: 29.3 % — AB (ref 11.5–14.5)
WBC: 8.6 10*3/uL (ref 3.8–10.6)

## 2016-06-10 LAB — AMMONIA: Ammonia: 216 umol/L — ABNORMAL HIGH (ref 9–35)

## 2016-06-10 MED ORDER — CHLORHEXIDINE GLUCONATE 0.12% ORAL RINSE (MEDLINE KIT)
15.0000 mL | Freq: Two times a day (BID) | OROMUCOSAL | Status: DC
  Administered 2016-06-10 – 2016-06-12 (×5): 15 mL via OROMUCOSAL
  Filled 2016-06-10 (×6): qty 15

## 2016-06-10 MED ORDER — FENTANYL CITRATE (PF) 100 MCG/2ML IJ SOLN
INTRAMUSCULAR | Status: AC
  Filled 2016-06-10: qty 2

## 2016-06-10 MED ORDER — FENTANYL CITRATE (PF) 100 MCG/2ML IJ SOLN
100.0000 ug | INTRAMUSCULAR | Status: DC | PRN
  Administered 2016-06-12 (×2): 100 ug via INTRAVENOUS
  Filled 2016-06-10 (×2): qty 2

## 2016-06-10 MED ORDER — MIDAZOLAM HCL 2 MG/2ML IJ SOLN
2.0000 mg | INTRAMUSCULAR | Status: AC | PRN
  Administered 2016-06-10 – 2016-06-11 (×3): 2 mg via INTRAVENOUS
  Filled 2016-06-10 (×4): qty 2

## 2016-06-10 MED ORDER — MIDAZOLAM HCL 2 MG/2ML IJ SOLN
2.0000 mg | INTRAMUSCULAR | Status: DC | PRN
  Administered 2016-06-10 – 2016-06-12 (×7): 2 mg via INTRAVENOUS
  Filled 2016-06-10 (×6): qty 2

## 2016-06-10 MED ORDER — VITAL AF 1.2 CAL PO LIQD
1000.0000 mL | ORAL | Status: DC
  Administered 2016-06-10 – 2016-06-12 (×4): 1000 mL

## 2016-06-10 MED ORDER — FENTANYL CITRATE (PF) 100 MCG/2ML IJ SOLN
100.0000 ug | Freq: Once | INTRAMUSCULAR | Status: AC
  Administered 2016-06-10: 100 ug via INTRAVENOUS

## 2016-06-10 MED ORDER — FREE WATER
200.0000 mL | Status: DC
  Administered 2016-06-10 – 2016-06-12 (×13): 200 mL

## 2016-06-10 MED ORDER — FENTANYL CITRATE (PF) 100 MCG/2ML IJ SOLN
100.0000 ug | INTRAMUSCULAR | Status: AC | PRN
  Administered 2016-06-11 – 2016-06-12 (×3): 100 ug via INTRAVENOUS
  Filled 2016-06-10 (×3): qty 2

## 2016-06-10 MED ORDER — GADOBENATE DIMEGLUMINE 529 MG/ML IV SOLN
20.0000 mL | Freq: Once | INTRAVENOUS | Status: AC | PRN
  Administered 2016-06-10: 19 mL via INTRAVENOUS

## 2016-06-10 MED ORDER — ACETAMINOPHEN 160 MG/5ML PO SOLN: 650.0000 mg | Freq: Four times a day (QID) | ORAL | Status: DC | PRN

## 2016-06-10 MED ORDER — ACETAMINOPHEN 325 MG PO TABS
650.0000 mg | ORAL_TABLET | Freq: Four times a day (QID) | ORAL | Status: DC | PRN
  Administered 2016-06-10: 650 mg via NASOGASTRIC
  Filled 2016-06-10: qty 2

## 2016-06-10 MED ORDER — ANTISEPTIC ORAL RINSE SOLUTION (CORINZ)
7.0000 mL | OROMUCOSAL | Status: DC
  Administered 2016-06-10 – 2016-06-12 (×22): 7 mL via OROMUCOSAL
  Filled 2016-06-10 (×26): qty 7

## 2016-06-10 NOTE — Progress Notes (Addendum)
Chaplain rounded the unit to provide a compassionate presence and support to the patient. The patient a 55 year old male appeared to be sleeping. Silent prayer was said. Jefm PettyChaplain Shatika Grinnell [phone]) [phone]

## 2016-06-10 NOTE — Care Management (Signed)
[facility] ([phone]). Patient remains on wait list with no bed available. Discussed alternative transfer locations and per Dr. Belia HemanKasa, son refuses to even discuss [facility].

## 2016-06-10 NOTE — Care Management (Signed)
Barrier to DC: Intubated, non responsive to verbal stimuli. [facility]

## 2016-06-10 NOTE — Progress Notes (Signed)
Subjective: Patient remains intubated and sedated.    Objective: Current vital signs: BP 100/62 mmHg  Pulse 94  Temp(Src) 98.8 F (37.1 C) (Core (Comment))  Resp 24  Ht 6' 1" (1.854 m)  Wt 91.3 kg (201 lb 4.5 oz)  BMI 26.56 kg/m2  SpO2 99% Vital signs in last 24 hours: Temp:  [98.4 F (36.9 C)-101.3 F (38.5 C)] 98.8 F (37.1 C) (06/19 1100) Pulse Rate:  [94-117] 94 (06/19 1100) Resp:  [20-33] 24 (06/19 1100) BP: (100-178)/(59-96) 100/62 mmHg (06/19 1100) SpO2:  [95 %-100 %] 99 % (06/19 1100) FiO2 (%):  [35 %] 35 % (06/19 0741) Weight:  [91.3 kg (201 lb 4.5 oz)] 91.3 kg (201 lb 4.5 oz) (06/19 0528)  Intake/Output from previous day: 06/18 0701 - 06/19 0700 In: 1565.2 [I.V.:10.2; NG/GT:1350; IV Piggyback:205] Out: 1140 [Urine:1140] Intake/Output this shift:   Nutritional status:    Neurologic Exam: Patient does not respond to verbal stimuli. Does not respond to deep sternal rub. Does not follow commands. No verbalizations noted.  Cranial Nerves: II: patient does not respond confrontation bilaterally, pupils right 4 mm, left 4 mm,and reactive bilaterally III,IV,VI: doll's response absent bilaterally.  V,VII: corneal reflex reduced but better on the right  VIII: patient does not respond to verbal stimuli IX,X: gag reflex reduced, XI: trapezius strength unable to test bilaterally XII: tongue strength unable to test Motor: Extremities flaccid throughout. No spontaneous movement noted. No purposeful movements noted. Sensory: Does not respond to noxious stimuli in any extremity  Lab Results: Basic Metabolic Panel:  Recent Labs Lab 06/03/16 1416 06/04/16 0603 06/04/16 1126 06/04/16 2020 06/05/16 0504  06/06/16 0435 06/07/16 0431 06/08/16 0456 06/09/16 0544 06/10/16 0549  NA 135 140  --   --  144  < > 144 139 143 143 145  K 4.3 3.5  --   --  5.4*  < > 3.4* 3.0* 4.1 4.9 5.0  CL 112* 117*  --   --  121*  < > 121* 115* 119* 120* 125*  CO2 17* 15*  --   --   18*  < > 13* 17* 19* 19* 16*  GLUCOSE 95 85  --   --  81  < > 107* 98 98 97 104*  BUN 9 14  --   --  32*  < > 44* 34* 37* 38* 42*  CREATININE 0.40* 0.57*  --   --  1.84*  < > 1.82* 0.88 0.88 0.73 0.89  CALCIUM 8.9 8.8*  --   --  9.0  < > 8.6* 8.5* 9.1 9.2 10.0  MG 1.8 1.8 1.8 2.0 1.9  --   --   --   --   --   --   PHOS  --  3.3 3.5 5.7* 7.0*  --   --   --   --   --   --   < > = values in this interval not displayed.  Liver Function Tests:  Recent Labs Lab 06/05/16 0504 06/06/16 0435 06/07/16 0431 06/08/16 0456 06/10/16 0549  AST 60* 59* 60* 58* 86*  ALT _0 ALKPHOS 52 49 51 51 76  BILITOT 4.8* 4.4* 5.4* 6.3* 4.9*  PROT 7.1 7.1 7.0 7.0 7.4  ALBUMIN 2.2* 2.1* 2.1* 2.0* 2.1*   No results for input(s): LIPASE, AMYLASE in the last 168 hours.  Recent Labs Lab 06/07/16 0431 06/08/16 0456 06/10/16 0549  AMMONIA 140* 130* 216*    CBC:  Recent Labs  Lab 06/03/16 1416 06/04/16 0603 06/05/16 0504 06/06/16 0435 06/08/16 0456 06/10/16 0549  WBC 6.6 6.7 10.9* 10.2 11.1* 8.6  NEUTROABS 3.9  --   --   --   --   --   HGB 10.2* 9.8* 9.6* 9.0* 9.3* 9.6*  HCT 30.8* 29.0* 29.1* 27.3* 28.1* 30.3*  MCV 88.8 88.2 91.4 92.0 91.9 96.7  PLT 173 163 164 142* 107* 111*    Cardiac Enzymes:  Recent Labs Lab 06/03/16 1416  TROPONINI <0.03    Lipid Panel:  Recent Labs Lab 06/04/16 1126 06/08/16 0456  TRIG 58 82    CBG:  Recent Labs Lab 06/09/16 0735 06/09/16 1126 06/09/16 1537 06/09/16 2356 06/10/16 0742  GLUCAP 88 74 74 99 93    Microbiology: Results for orders placed or performed during the hospital encounter of 06/03/16  MRSA PCR Screening     Status: None   Collection Time: 06/03/16  4:36 PM  Result Value Ref Range Status   MRSA by PCR NEGATIVE NEGATIVE Final    Comment:        The GeneXpert MRSA Assay (FDA approved for NASAL specimens only), is one component of a comprehensive MRSA colonization surveillance program. It is not intended to  diagnose MRSA infection nor to guide or monitor treatment for MRSA infections.     Coagulation Studies: No results for input(s): LABPROT, INR in the last 72 hours.  Imaging: Dg Chest Port 1 View  06/10/2016  CLINICAL DATA:  Respiratory failure. EXAM: PORTABLE CHEST 1 VIEW COMPARISON:  06/08/2016. FINDINGS: Endotracheal tube, NG tube, left IJ line stable position. Heart size stable. Low lung volumes with mild bibasilar atelectasis and/or infiltrates again noted. Interim improvement small left pleural effusion. No pneumothorax. Stable heart size. No acute bony abnormality. IMPRESSION: 1. Lines and tubes in stable position. 2. Persistent mild bibasilar atelectasis and/or infiltrates again noted. Small left pleural effusion has improved. Electronically Signed   By: Thomas  Register   On: 06/10/2016 07:16    Medications:  I have reviewed the patient's current medications. Scheduled: . antiseptic oral rinse  7 mL Mouth Rinse 10 times per day  . chlorhexidine gluconate (SAGE KIT)  15 mL Mouth Rinse BID  . enoxaparin (LOVENOX) injection  40 mg Subcutaneous Q24H  . famotidine  20 mg Per Tube BID  . feeding supplement (PRO-STAT SUGAR FREE 64)  30 mL Per Tube TID  . free water  200 mL Per Tube Q8H  . lacosamide (VIMPAT) IV  200 mg Intravenous Q12H  . lactulose  30 g Per Tube BID  . levETIRAcetam  1,500 mg Intravenous Q12H  . rifaximin  550 mg Per Tube TID  . sodium chloride  250 mL Intravenous Once    Assessment/Plan: Patient on Versed. On Vimpat 200mg q 12hour and Keppra 1500mg q 12 hours. EEG's have showed no evidence of subclinical seizure activity. Patient breathing some over the ventilator. Prognosis remains poor.   Recommendations: 1. Continue anticonvulsant therapy at current dose 2. Repeat MRI scheduled for today.    LOS: 7 days   Leslie Reynolds, MD Neurology [phone] 06/10/2016  11:13 AM     

## 2016-06-10 NOTE — Progress Notes (Signed)
Notified Dr. Thad Rangereynolds about MRI results.

## 2016-06-10 NOTE — Progress Notes (Signed)
PULMONARY / CRITICAL CARE MEDICINE   Name: Jon Morgan MRN: [id number] DOB: [birth year]    ADMISSION DATE:  06/03/2016  PT PROFILE: 55 M with h/o cirrhosis, CVAs seizure d/o presented to ED with status epilepticus and intubated in ED. Admitted by PCCM service  MAJOR EVENTS/TEST RESULTS: 06/12 Admitted via ED with status epilepticus, VDRF 06/12 CT head: Generalized atrophy with interval decrease in size of the low-attenuation focus seen previously at the high RIGHT frontal lobe compatible with evolving subacute infarction. New areas of low attenuation and loss of gray-white differentiation in the posterior RIGHT parietal and occipital lobes compatible with acute to subacute infarction. 06/13 Neuro consultation: recommended increased dose of Keppra. EEG ordered. MRI ordered 06/13 ammonia level > 500. Lactulose, rifaximin initiated 06/13 EEG: This is an abnormal EEG secondary to general background slowing. This finding may be seen with a diffuse disturbance that is etiologically nonspecific, but is consistent with the patient's current medications and may be related to the patient being post-ictal as well. No epileptiform activity is noted 06/13 MRI: New extensive cortically based restricted diffusion throughout both cerebral hemispheres which may reflect anoxic injury or the sequelae of recent seizure activity. Creutzfeldt-Jakob disease was considered but is felt unlikely given the clinical presentation and rapid development of these findings. New edema in the posteromedial right temporal and right occipital lobes suspicious for subacute PCA territory infarct. Slightly decreased size of subacute right frontoparietal hematoma 06/13 Pt's son requesting transfer to [facility]. Dr Donnie Aho @ [facility] accepted pt. Awaiting bed availability 06/13 Further seizure despite Keppra, propofol. Midazolam infusion initiated and Vimpat added 06/14 repeat EEG: This is an abnormal electroencephalogram due to the attenuated,  slow background. This activity is consistent with the patient's medications and can be seen in the postictal state, but can be seen with other pathologies as well. Clinical correlation recommended. No epileptiform activity is noted 06/15 Midazolam infusion stopped. Remains comatose on propofol infusion 06/16 Remained comatose. Propofol infusion discontinued. Repeat EEG: This is a technically difficult record due to the predominance of muscle and movement artifact. When able to be visualized the background activity was slow and poorly organized. There was some evidence of generalized polyspike activity that did appear distinct from artifact which is consistent with the patient's history of seizures. There was no evidence of electrographic seizures 06/17 Remained comatose. Neurology suspects "twitching is myoclonus. Repeat MIR brain ordered - cannot be done until 06/19 06/18 remains comatose. Periods of hypertension - PRN hydralazine ordered  INDWELLING DEVICES:: ETT 06/12 >>  L IJ CVL 06/13 >>   MICRO DATA: MRSA PCR 06/12 >> NEG  PROCALCITONIN: 06/15: 0.27 06/16: 0.18 06/17: 0.24 06/18: 0.14  ANTIMICROBIALS:    SUBJECTIVE:  Comatose, remains intubated, off sedation prognosis is very poor, Updated Son who is unrealistic, does not want to listen what we have to say    VITAL SIGNS: BP 132/79 mmHg  Pulse 94  Temp(Src) 101.3 F (38.5 C) (Core (Comment))  Resp 32  Ht 6\' 1"  (1.854 m)  Wt 201 lb 4.5 oz (91.3 kg)  BMI 26.56 kg/m2  SpO2 99%     VENTILATOR SETTINGS: Vent Mode:  [-] PRVC FiO2 (%):  [35 %] 35 % Set Rate:  [16 bmp] 16 bmp Vt Set:  [500 mL-550 mL] 550 mL PEEP:  [5 cmH20] 5 cmH20 Pressure Support:  [8 cmH20-12 cmH20] 12 cmH20 Plateau Pressure:  [22 cmH20] 22 cmH20  INTAKE / OUTPUT: I/O last 3 completed shifts: In: 2506.3 [I.V.:132.6; NG/GT:2168.7; IV Piggyback:205] Out: 2135 [  Urine:1835; Stool:300]  PHYSICAL EXAMINATION: General: Intubated, comatose,  acutely ill looking Neuro: pupils equal, non-reactive, no corneal reflexes  HEENT: NCAT, sclera icterus, ETT/OGT Cardiovascular: tachycardic, regular, no M Lungs:Coarse, no wheezes  Abdomen:  soft, distended, hypoactive BS Ext: warm, no edema  LABS:  BMET  Recent Labs Lab 06/07/16 0431 06/08/16 0456 06/09/16 0544  NA 139 143 143  K 3.0* 4.1 4.9  CL 115* 119* 120*  CO2 17* 19* 19*  BUN 34* 37* 38*  CREATININE 0.88 0.88 0.73  GLUCOSE 98 98 97    Electrolytes  Recent Labs Lab 06/04/16 1126 06/04/16 2020 06/05/16 0504  06/07/16 0431 06/08/16 0456 06/09/16 0544  CALCIUM  --   --  9.0  < > 8.5* 9.1 9.2  MG 1.8 2.0 1.9  --   --   --   --   PHOS 3.5 5.7* 7.0*  --   --   --   --   < > = values in this interval not displayed.  CBC  Recent Labs Lab 06/06/16 0435 06/08/16 0456 06/10/16 0549  WBC 10.2 11.1* 8.6  HGB 9.0* 9.3* 9.6*  HCT 27.3* 28.1* 30.3*  PLT 142* 107* 111*    Coag's  Recent Labs Lab 06/07/16 0431  APTT 56*  INR 2.19    Sepsis Markers  Recent Labs Lab 06/07/16 1212 06/08/16 0456 06/09/16 0544  PROCALCITON 0.18 0.24 0.14    ABG  Recent Labs Lab 06/04/16 0445 06/04/16 1554 06/06/16 2032  PHART 7.62* 7.42 7.43  PCO2ART <19* 25* 27*  PO2ART 148* 106 72*    Liver Enzymes  Recent Labs Lab 06/06/16 0435 06/07/16 0431 06/08/16 0456  AST 59* 60* 58*  ALT 25 23 22   ALKPHOS 49 51 51  BILITOT 4.4* 5.4* 6.3*  ALBUMIN 2.1* 2.1* 2.0*    Cardiac Enzymes  Recent Labs Lab 06/03/16 1416  TROPONINI <0.03    Glucose  Recent Labs Lab 06/08/16 2351 06/09/16 0428 06/09/16 0735 06/09/16 1126 06/09/16 1537 06/09/16 2356  GLUCAP 86 84 88 74 74 99     ASSESSMENT AND PLAN:  55 yo AAF with multiple medical issues with very poor prognosis is brain injury from acute CVA edema, with hepatic cirrhosis  NEUROLOGIC A:   Status epilepticus, refractory Hepatic encephalopathy 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   Poor prognosis for neurological recovery P:   Neurology following Continue Keppra 1500 mg IV BID, Vimpat 100 mg IV BID Cont lactulose and rifaximin Daily WUA  Monitor ammonia level intermitttently  PULMONARY A: VDRF due to AMS Primary respiratory alkalosis due to hepatic encephalopathy P:   Cont vent support - settings reviewed and/or adjusted-TV increased to 550 ABG this am Cont vent bundle  Daily SBT if/when meets criteria  CARDIOVASCULAR A:  History of hypertension Tachycardia Hypotension - resolved Hypertension Sinus tachycardia P: MAP goal > 70 mmHg  PRN hydralazine to maintain SBP < 160 mmHg PRN metoprolol to maintain HR < 115/min  RENAL A:   AKI, resolved Recurrent hypokalemia-resolved P:   Monitor BMET intermittently Monitor I/Os Correct electrolytes as indicated Free water flushes with TFs D/C maintenance fluids  GASTROINTESTINAL A:   Severe cirrhosis Markedly elevated ammonia P:   SUP: enteral famotidine Cont TF per protocol  HEMATOLOGIC A:   Anemia without acute blood loss 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 Thrombocytopenia - suspect related to liver dz and hypersplenism  P:  DVT px: SCDs Monitor CBC intermittently Transfuse per usual guidelines  INFECTIOUS A:   No  acute issues Minimally elevated PCT P:   Monitor temp, WBC count Micro and abx as above  ENDOCRINE A:   Episodic hypoglycemia due to liver failure and renal failure - resolved P:   Monitor CBGs Avoid insulin unless glucose > 200   Magdalene [facility] ANP-BC Pulmonary and Critical Care Medicine [facility] Pager [phone] or [phone]  06/10/2016    STAFF NOTE: I, Dr. Lucie Leather,  have personally reviewed patient's available data, including medical history, events of note, physical examination and test results as part of my evaluation. I have discussed with NP and other care providers such as pharmacist, RN and RRT.  In addition,  I personally  evaluated patient and elicited key findings     A:remains comatosed  P:prognosis is poor, son updated, [facility] transfer pending      The Rest per NP whose note is outlined above and that I agree with  I have personally reviewed/obtained a history, examined the patient, evaluated Pertinent laboratory and RadioGraphic/imaging results, and  formulated the assessment and plan   The Patient requires high complexity decision making for assessment and support, frequent evaluation and titration of therapies, application of advanced monitoring technologies and extensive interpretation of multiple databases. Critical Care Time devoted to patient care services described in this note is 45 minutes.  This Critical care time does not reflrect procedure time or supervisory time of NP but could involve care discussion time Overall, patient is critically ill, prognosis is guarded.  Patient with Multiorgan failure and at high risk for cardiac arrest and death.    Lucie Leather, M.D.  [facility]  Medical Director [facility] Medical Director [facility] Cardio-Pulmonary Department       I have called pt's son on his cell phone. I have been able to speak to him and he seems very unreasonable, patients prognosis is very poor per neurology,  He has been demanding,  insisted on transfer to [facility] "immediately", and threatening lawsuits. It has been explained to him that we cannot control when a [facility] bed is available. Will await for [facility] transfer

## 2016-06-10 NOTE — Progress Notes (Signed)
[facility] bed is not available but pt is on the waiting list.

## 2016-06-10 NOTE — Clinical Social Work Note (Signed)
Clinical Social Work Assessment  Patient Details  Name: Jon Morgan MRN: [id number] Date of Birth: 1961/07/24  Date of referral:  06/09/16               Reason for consult:  Facility Placement                Permission sought to share information with:  Family Supports, Customer service manager Permission granted to share information::  Yes, Verbal Permission Granted  Name::     Son Jon Morgan [phone]  Agency::  [facility]  Relationship::  yes  Contact Information:  yes  Housing/Transportation Living arrangements for the past 2 months:  [place] of Information:  Adult Children Patient Interpreter Needed:  None Criminal Activity/Legal Involvement Pertinent to Current Situation/Hospitalization:  No - Comment as needed Significant Relationships:  Adult Children Lives with:  Facility Resident Do you feel safe going back to the place where you live?  Yes Need for family participation in patient care:  Yes (Comment)  Care giving concerns:  Met with son Jon Langston Junior- He would like to have his father moved to [place] Worker assessment / plan: LCSW met with son on June 18th at 5:15 at the CCU and met with him to collect data to complete assessment. LCSW explained the purpose of the assessment to determine patients baseline. ( unable to assess patient as he is intubated). Patient was placed at [facility]) by his son and stayed for a short time as son was dissatisfied by care provided to his father and relocated him to [facility] approx a month ago. 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 Patients son reported that pt he had issues with substances in the past. Patient has been married a few times last wife was Jon Morgan ( [place]) and  Pt had a son with her. Patients son does not mind if they visit. He would not provide any details on how to contact them but stated they are aware he  is at [facility]. Patients son wants his father to go to [facility] ASAP and reported he spoke to Mr Barbette Or of [facility]  Who is aware of his wants, but would not elaborate except he has discussed this thoroughly with the director. Patient required full assistance with all ADL's and patient is unable to walk. He is FULL code and has indwelling catheters. Patient  Son reports he feels his father is disoriented because of the sedation medicines. Insurance in [place]. Son expressed no further needs.   Employment status:  Disabled (Comment on whether or not currently receiving Disability) Insurance information:  Medicaid In [place] PT Recommendations:  Not assessed at this time Information / Referral to community resources:  Impact  Patient/Family's Response to care: Wants his father to be relocated to [facility]  Patient/Family's Understanding of and Emotional Response to Diagnosis, Current Treatment, and Prognosis: Son reports he has had discussions with Dr Shawna Orleans and is aware of his fathers current health issues.  Emotional Assessment Appearance:  Appears stated age Attitude/Demeanor/Rapport:  Unable to Assess Affect (typically observed):  Unable to Assess Orientation:  Fluctuating Orientation (Suspected and/or reported Sundowners) Alcohol / Substance use:  Tobacco Use, Alcohol Use, Illicit Drugs Psych involvement (Current and /or in the community):  No (Comment)  Discharge Needs  Concerns to be addressed:  Other (Comment Required (Patients son request to have his father moved to [facility]) Readmission within  the last 30 days:  No Current discharge risk:  Other Barriers to Discharge:  Continued Medical Work up   Joana Reamer, LCSW 06/10/2016, 7:53 AM

## 2016-06-10 NOTE — Progress Notes (Signed)
Sedation medication did not work as well as when tidal volume was increased to 550. Pt resting more comfortably and alarm not going off for respirations as often now. Informed NP and agreed that sedation was not needed for this patient.

## 2016-06-10 NOTE — Progress Notes (Signed)
Nutrition Follow-up  DOCUMENTATION CODES:   Not applicable  INTERVENTION:  -TF: Recommend increasing TF to rate of 65 ml/hr as pt off diprivan to better meet nutritional needs, continue Prostat TID; Initiate PEPuP protocol as pt now tolerating TF at goal rate; orders entered, discussed with Charli RN. Pt with large volume of stool, (1 L documented this AM) and sodium trending up; recommend increasing free water flushes to 200 mL q 4 hours (total free water of 2.5 L in 24 hours)   NUTRITION DIAGNOSIS:   Inadequate oral intake related to acute illness as evidenced by NPO status.  Being addressed via TF  GOAL:   Provide needs based on ASPEN/SCCM guidelines  MONITOR:   Vent status, TF tolerance, Weight trends, Labs  REASON FOR ASSESSMENT:   Ventilator, Consult Enteral/tube feeding initiation and management  ASSESSMENT:   55 yo male admitted with acute respiratory failure requiring intubation on 06/03/16, hepatic encephalopathy, seizures. [facility] after admission for acute respiratory failure s/p seizures requiring intubation  Patient is currently intubated on ventilator support MV: 16 L/min Temp (24hrs), Avg:99.8 F (37.7 C), Min:98.4 F (36.9 C), Max:101.3 F (38.5 C)  Propofol: discontinued  Tolerating Vital AF 1.2 at rate of 50 ml/hr, free water flush of 200 mL q 8 hours, Prostat TID  Diet Order:   NPO  Skin:   (stage III sacrum, deep tissue injury heel)  Last BM:  6/19 liquid stool via flexiseal, 1000 mL documented by RN this AM  Labs: sodium trending up (145 this AM), ammonia 216  Meds: lactulose   Height:   Ht Readings from Last 1 Encounters:  06/03/16 6\' 1"  (1.854 m)    Weight:   Wt Readings from Last 1 Encounters:  06/10/16 201 lb 4.5 oz (91.3 kg)    Ideal Body Weight:     BMI:  Body mass index is 26.56 kg/(m^2).  Estimated Nutritional Needs:   Kcal:  2163 kcals  Protein:  129-172 g   Fluid:  >/= 2  L  EDUCATION NEEDS:   No education needs identified at this time  Romelle StarcherCate Suleyma Wafer MS, RD, LDN [phone] Pager  [phone] Weekend/On-Call Pager

## 2016-06-10 NOTE — Progress Notes (Signed)
Called NP for order for tachypnea and tachycardia, also inquired about tylenol for elevated temp.

## 2016-06-11 LAB — CBC
HCT: 26.6 % — ABNORMAL LOW (ref 40.0–52.0)
Hemoglobin: 8.8 g/dL — ABNORMAL LOW (ref 13.0–18.0)
MCH: 31.8 pg (ref 26.0–34.0)
MCHC: 33 g/dL (ref 32.0–36.0)
MCV: 96.5 fL (ref 80.0–100.0)
Platelets: 80 10*3/uL — ABNORMAL LOW (ref 150–440)
RBC: 2.75 MIL/uL — ABNORMAL LOW (ref 4.40–5.90)
RDW: 28.3 % — AB (ref 11.5–14.5)
WBC: 7.6 10*3/uL (ref 3.8–10.6)

## 2016-06-11 LAB — MAGNESIUM: MAGNESIUM: 2 mg/dL (ref 1.7–2.4)

## 2016-06-11 LAB — GLUCOSE, CAPILLARY
GLUCOSE-CAPILLARY: 80 mg/dL (ref 65–99)
GLUCOSE-CAPILLARY: 73 mg/dL (ref 65–99)

## 2016-06-11 LAB — BASIC METABOLIC PANEL
Anion gap: 2 — ABNORMAL LOW (ref 5–15)
BUN: 41 mg/dL — AB (ref 6–20)
CALCIUM: 9.5 mg/dL (ref 8.9–10.3)
CO2: 20 mmol/L — ABNORMAL LOW (ref 22–32)
CREATININE: 0.53 mg/dL — AB (ref 0.61–1.24)
Chloride: 126 mmol/L — ABNORMAL HIGH (ref 101–111)
GFR calc non Af Amer: >60 mL/min (ref >60)
Glucose, Bld: 100 mg/dL — ABNORMAL HIGH (ref 65–99)
Potassium: 4.1 mmol/L (ref 3.5–5.1)
SODIUM: 148 mmol/L — AB (ref 135–145)

## 2016-06-11 LAB — PHOSPHORUS: PHOSPHORUS: 2.8 mg/dL (ref 2.5–4.6)

## 2016-06-11 MED ORDER — DIPHENHYDRAMINE HCL 50 MG/ML IJ SOLN
50.0000 mg | Freq: Four times a day (QID) | INTRAMUSCULAR | Status: AC | PRN
Start: 2016-06-11 — End: 2016-06-12
  Administered 2016-06-11: 50 mg via INTRAVENOUS
  Filled 2016-06-11: qty 1

## 2016-06-11 MED ORDER — DEXTROSE 5 % IV SOLN
10.0000 mg/kg | Freq: Three times a day (TID) | INTRAVENOUS | Status: DC
  Administered 2016-06-11: 900 mg via INTRAVENOUS
  Filled 2016-06-11 (×2): qty 18

## 2016-06-11 MED ORDER — METHYLPREDNISOLONE SODIUM SUCC 125 MG IJ SOLR
60.0000 mg | Freq: Four times a day (QID) | INTRAMUSCULAR | Status: AC
  Administered 2016-06-11 – 2016-06-12 (×4): 60 mg via INTRAVENOUS
  Filled 2016-06-11 (×4): qty 2

## 2016-06-11 NOTE — Progress Notes (Signed)
Pharmacist Lorin PicketScott will address decreased platelets and use of Lovenox with intensivist

## 2016-06-11 NOTE — Progress Notes (Signed)
Nutrition Follow-up  DOCUMENTATION CODES:   Not applicable  INTERVENTION:  -EN: recommend continuing current TF regimen; BMP pending for today, sodium has been trending up, stool output increased. May need to further increase free water, continue to assess   NUTRITION DIAGNOSIS:   Inadequate oral intake related to acute illness as evidenced by NPO status.  Being addressed via TF  GOAL:   Provide needs based on ASPEN/SCCM guidelines  MONITOR:   Vent status, TF tolerance, Weight trends, Labs  REASON FOR ASSESSMENT:   Ventilator, Consult Enteral/tube feeding initiation and management  ASSESSMENT:   55 yo male admitted with acute respiratory failure requiring intubation on 06/03/16, hepatic encephalopathy, seizures. [facility]16 after admission for acute respiratory failure s/p seizures requiring intubation  Pt remains on vent  Tolerating Vital AF 1.2 at rate of 65 ml/hr, free water 200 mL q 4 hours, Prostat TID. Pt on PEPuP protocol. Flexiseal with 1700 mL stool output in 24 hours; pt on lactulose. UOP 1445 mL  Diet Order:   NPO  Skin:   (stage III sacrum, deep tissue injury heel)  Last BM:  6/19 liquid stool via flexiseal,   Labs: BMP pending for today  Meds: lactulose  Height:   Ht Readings from Last 1 Encounters:  06/11/16 6' (1.829 m)    Weight:   Wt Readings from Last 1 Encounters:  06/11/16 198 lb 6.6 oz (90 kg)    Ideal Body Weight:     BMI:  Body mass index is 26.9 kg/(m^2).  Estimated Nutritional Needs:   Kcal:  2163 kcals  Protein:  129-172 g   Fluid:  >/= 2 L  EDUCATION NEEDS:   No education needs identified at this time  Romelle StarcherCate Wakeelah Solan MS, RD, LDN [phone] Pager  [phone] Weekend/On-Call Pager

## 2016-06-11 NOTE — Progress Notes (Signed)
PARENTERAL NUTRITION CONSULT NOTE - INITIAL  Pharmacy Consult for Electrolyte Monitoring and Replacement    No Known Allergies  Patient Measurements: Height: 6' (182.9 cm) Weight: 198 lb 6.6 oz (90 kg) IBW/kg (Calculated) : 77.6  Vital Signs: Temp: 97.6 F (36.4 C) (06/20 0743) Temp Source: Axillary (06/20 0743) BP: 129/58 mmHg (06/20 1100) Pulse Rate: 90 (06/20 1100) Intake/Output from previous day: 06/19 0701 - 06/20 0700 In: 2726.5 [NG/GT:2406.5; IV Piggyback:320] Out: 8887 [NZVJK:8206; Stool:1700] Intake/Output from this shift: Total I/O In: 400 [NG/GT:400] Out: 200 [Stool:200]  Labs:  Recent Labs  06/10/16 0549 06/11/16 1102  WBC 8.6 7.6  HGB 9.6* 8.8*  HCT 30.3* 26.6*  PLT 111* 80*     Recent Labs  06/09/16 0544 06/10/16 0549 06/11/16 1102  NA 143 145 148*  K 4.9 5.0 4.1  CL 120* 125* 126*  CO2 19* 16* 20*  GLUCOSE 97 104* 100*  BUN 38* 42* 41*  CREATININE 0.73 0.89 0.53*  CALCIUM 9.2 10.0 9.5  MG  --   --  2.0  PHOS  --   --  2.8  PROT  --  7.4  --   ALBUMIN  --  2.1*  --   AST  --  86*  --   ALT  --  29  --   ALKPHOS  --  76  --   BILITOT  --  4.9*  --   BILIDIR  --  2.5*  --   IBILI  --  2.4*  --    Estimated Creatinine Clearance: 117.2 mL/min (by C-G formula based on Cr of 0.53).    Recent Labs  06/10/16 1629 06/10/16 2356 06/11/16 0732  GLUCAP 81 82 80    Medical History: Past Medical History  Diagnosis Date  . Hypertension   . Hepatic encephalopathy ([place])   . Seizure ([place])   . Intracranial hemorrhage ([place])   . Liver cirrhosis ([facility])     Medications:  Scheduled:  . acyclovir  10 mg/kg Intravenous Q8H  . antiseptic oral rinse  7 mL Mouth Rinse 10 times per day  . chlorhexidine gluconate (SAGE KIT)  15 mL Mouth Rinse BID  . enoxaparin (LOVENOX) injection  40 mg Subcutaneous Q24H  . famotidine  20 mg Per Tube BID  . feeding supplement (PRO-STAT SUGAR FREE 64)  30 mL Per Tube TID  . free water  200 mL Per Tube Q4H  .  lacosamide (VIMPAT) IV  200 mg Intravenous Q12H  . lactulose  30 g Per Tube BID  . levETIRAcetam  1,500 mg Intravenous Q12H  . rifaximin  550 mg Per Tube TID  . sodium chloride  250 mL Intravenous Once   Infusions:  . feeding supplement (VITAL AF 1.2 CAL) 1,000 mL (06/10/16 2000)    Assessment: Pharmacy consulted to assist in managing electrolytes in this 55 y/o M admitted with status epilepticus.   Plan:  Electrolytes are WNL. Will f/u AM labs.   Ulice Dash D 06/11/2016,12:04 PM

## 2016-06-11 NOTE — Progress Notes (Signed)
Spoke with elink about pt's morning labs and if there should be some ordered.  No new orders given at this time.

## 2016-06-11 NOTE — NC FL2 (Signed)
[place] LEVEL OF CARE SCREENING TOOL     IDENTIFICATION  Patient Name: Jon Morgan Birthdate: May 15, 1961 Sex: male Admission Date (Current Location): 06/03/2016  [place] and [place] Number:  Engineering geologist and Address:  [facility], [address], [place], [place] [address]      Provider Number: [id number]  Attending Physician Name and Address:  Wilhelmina Mcardle, MD  Relative Name and Phone Number:       Current Level of Care: SNF Recommended Level of Care: [place] Prior Approval Number:    Date Approved/Denied:   PASRR Number: [id number]  Discharge Plan: SNF    Current Diagnoses: Patient Active Problem List   Diagnosis Date Noted  . Status epilepticus ([place])   . Encephalopathy, hepatic ([place])   . AKI (acute kidney injury) ([place])   . Seizure ([place])   . Acute respiratory failure ([place]) 06/03/2016  . Hematochezia   . Cirrhosis of liver without ascites ([place])   . Encounter for orogastric (OG) tube placement   . Right-sided nontraumatic intracerebral hemorrhage ([place])   . 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 Seizures ([place]) 05/17/2016  . Pressure ulcer 05/17/2016  . Acute respiratory failure with hypoxia ([place])   . Acute encephalopathy   . Thrombocytopenia ([place])   . Essential hypertension     Orientation RESPIRATION BLADDER Height & Weight     Self, Place  Vent Indwelling catheter Weight: 198 lb 6.6 oz (90 kg) Height:  6' (182.9 cm)  BEHAVIORAL SYMPTOMS/MOOD NEUROLOGICAL BOWEL NUTRITION STATUS   (none) Convulsions/Seizures Incontinent  (liquids)  AMBULATORY STATUS COMMUNICATION OF NEEDS Skin   Extensive Assist Verbally Skin abrasions (skin ulcers)     PU Stage 3 Dressing:  (TBD)                 Personal Care Assistance Level of Assistance  Bathing Bathing Assistance: Maximum assistance Feeding assistance: Maximum assistance Dressing Assistance: Maximum assistance Total Care  Assistance: Maximum assistance   Functional Limitations Info    Sight Info: Adequate Hearing Info: Adequate Speech Info: Adequate    SPECIAL CARE FACTORS FREQUENCY  PT (By licensed PT), OT (By licensed OT)     PT Frequency: 5x OT Frequency: 3x            Contractures Contractures Info: Not present    Additional Factors Info  Code Status Code Status Info: Full Allergies Info: none           Current Medications (06/11/2016):  This is the current hospital active medication list Current Facility-Administered Medications  Medication Dose Route Frequency Provider Last Rate Last Dose  . acetaminophen (TYLENOL) tablet 650 mg  650 mg Per NG tube Q6H PRN Mikael Spray, NP   650 mg at 06/10/16 0417  . acyclovir (ZOVIRAX) 900 mg in dextrose 5 % 150 mL IVPB  10 mg/kg Intravenous Q8H Flora Lipps, MD   900 mg at 06/11/16 1306  . antiseptic oral rinse solution (CORINZ)  7 mL Mouth Rinse 10 times per day Wilhelmina Mcardle, MD   7 mL at 06/11/16 1439  . chlorhexidine gluconate (SAGE KIT) (PERIDEX) 0.12 % solution 15 mL  15 mL Mouth Rinse BID Wilhelmina Mcardle, MD   15 mL at 06/11/16 0736  . enoxaparin (LOVENOX) injection 40 mg  40 mg Subcutaneous Q24H Bincy S Varughese, NP   40 mg at 06/10/16 1613  . famotidine (PEPCID) 40 MG/5ML suspension 20 mg  20 mg Per Tube  BID Wilhelmina Mcardle, MD   20 mg at 06/11/16 4825  . feeding supplement (PRO-STAT SUGAR FREE 64) liquid 30 mL  30 mL Per Tube TID Wilhelmina Mcardle, MD   30 mL at 06/11/16 0948  . feeding supplement (VITAL AF 1.2 CAL) liquid 1,000 mL  1,000 mL Per Tube Continuous Flora Lipps, MD 65 mL/hr at 06/11/16 1304 1,000 mL at 06/11/16 1304  . fentaNYL (SUBLIMAZE) injection 100 mcg  100 mcg Intravenous Q15 min PRN Mikael Spray, NP      . fentaNYL (SUBLIMAZE) injection 100 mcg  100 mcg Intravenous Q2H PRN Mikael Spray, NP      . free water 200 mL  200 mL Per Tube Q4H Flora Lipps, MD   200 mL at 06/11/16 1305  . hydrALAZINE (APRESOLINE)  injection 10-20 mg  10-20 mg Intravenous Q4H PRN Wilhelmina Mcardle, MD   20 mg at 06/09/16 2020  . ipratropium-albuterol (DUONEB) 0.5-2.5 (3) MG/3ML nebulizer solution 3 mL  3 mL Nebulization Q4H PRN Bincy S Varughese, NP      . lacosamide (VIMPAT) 200 mg in sodium chloride 0.9 % 25 mL IVPB  200 mg Intravenous Q12H Alexis Goodell, MD   200 mg at 06/11/16 0959  . lactulose (CHRONULAC) 10 GM/15ML solution 30 g  30 g Per Tube BID Wilhelmina Mcardle, MD   30 g at 06/11/16 0948  . levETIRAcetam (KEPPRA) 1,500 mg in sodium chloride 0.9 % 100 mL IVPB  1,500 mg Intravenous Q12H Bincy S Varughese, NP   1,500 mg at 06/11/16 1440  . LORazepam (ATIVAN) injection 1 mg  1 mg Intravenous PRN Wilhelmina Mcardle, MD   1 mg at 06/10/16 1613  . metoprolol (LOPRESSOR) injection 2.5-5 mg  2.5-5 mg Intravenous Q3H PRN Wilhelmina Mcardle, MD   5 mg at 06/10/16 0453  . midazolam (VERSED) injection 2 mg  2 mg Intravenous Q2H PRN Mikael Spray, NP   2 mg at 06/10/16 0836  . rifaximin (XIFAXAN) tablet 550 mg  550 mg Per Tube TID Wilhelmina Mcardle, MD   550 mg at 06/11/16 0948  . sodium chloride 0.9 % bolus 250 mL  250 mL Intravenous Once Holley Raring, NP   Stopped at 06/04/16 1615     Discharge Medications: Please see discharge summary for a list of discharge medications.  Relevant Imaging Results:  Relevant Lab Results:   Additional Information SSN [id number]  Joana Reamer, [place]

## 2016-06-11 NOTE — Progress Notes (Signed)
Subjective: Remains intubated.  On Versed.  No seizure activity noted.    Objective: Current vital signs: BP 144/77 mmHg  Pulse 111  Temp(Src) 97.5 F (36.4 C) (Axillary)  Resp 28  Ht 6' (1.829 m)  Wt 90 kg (198 lb 6.6 oz)  BMI 26.90 kg/m2  SpO2 100% Vital signs in last 24 hours: Temp:  [97.5 F (36.4 C)-97.6 F (36.4 C)] 97.5 F (36.4 C) (06/20 1200) Pulse Rate:  [82-111] 111 (06/20 1400) Resp:  [17-31] 28 (06/20 1400) BP: (116-150)/(58-79) 144/77 mmHg (06/20 1400) SpO2:  [100 %] 100 % (06/20 1400) FiO2 (%):  [30 %-35 %] 30 % (06/20 1123) Weight:  [90 kg (198 lb 6.6 oz)] 90 kg (198 lb 6.6 oz) (06/20 0449)  Intake/Output from previous day: 06/19 0701 - 06/20 0700 In: 2726.5 [NG/GT:2406.5; IV Piggyback:320] Out: 8786 [VEHMC:9470; Stool:1700] Intake/Output this shift: Total I/O In: 400 [NG/GT:400] Out: 365 [Urine:165; Stool:200] Nutritional status:    Neurologic Exam: Patient does not respond to verbal stimuli. Does not respond to deep sternal rub. Does not follow commands. No verbalizations noted.  Cranial Nerves: II: patient does not respond confrontation bilaterally, pupils pinpoint III,IV,VI: doll's response absent bilaterally.  V,VII: corneal reflex reduced bilaterally VIII: patient does not respond to verbal stimuli IX,X: gag reflex reduced, XI: trapezius strength unable to test bilaterally XII: tongue strength unable to test Motor: Extremities flaccid throughout. No spontaneous movement noted. No purposeful movements noted. Sensory: Does not respond to noxious stimuli in any extremity  Lab Results: Basic Metabolic Panel:  Recent Labs Lab 06/04/16 2020 06/05/16 0504  06/07/16 0431 06/08/16 0456 06/09/16 0544 06/10/16 0549 06/11/16 1102  NA  --  144  < > 139 143 143 145 148*  K  --  5.4*  < > 3.0* 4.1 4.9 5.0 4.1  CL  --  121*  < > 115* 119* 120* 125* 126*  CO2  --  18*  < > 17* 19* 19* 16* 20*  GLUCOSE  --  81  < > 98 98 97 104* 100*  BUN   --  32*  < > 34* 37* 38* 42* 41*  CREATININE  --  1.84*  < > 0.88 0.88 0.73 0.89 0.53*  CALCIUM  --  9.0  < > 8.5* 9.1 9.2 10.0 9.5  MG 2.0 1.9  --   --   --   --   --  2.0  PHOS 5.7* 7.0*  --   --   --   --   --  2.8  < > = values in this interval not displayed.  Liver Function Tests:  Recent Labs Lab 06/05/16 0504 06/06/16 0435 06/07/16 0431 06/08/16 0456 06/10/16 0549  AST 60* 59* 60* 58* 86*  ALT _0 ALKPHOS 52 49 51 51 76  BILITOT 4.8* 4.4* 5.4* 6.3* 4.9*  PROT 7.1 7.1 7.0 7.0 7.4  ALBUMIN 2.2* 2.1* 2.1* 2.0* 2.1*   No results for input(s): LIPASE, AMYLASE in the last 168 hours.  Recent Labs Lab 06/07/16 0431 06/08/16 0456 06/10/16 0549  AMMONIA 140* 130* 216*    CBC:  Recent Labs Lab 06/05/16 0504 06/06/16 0435 06/08/16 0456 06/10/16 0549 06/11/16 1102  WBC 10.9* 10.2 11.1* 8.6 7.6  HGB 9.6* 9.0* 9.3* 9.6* 8.8*  HCT 29.1* 27.3* 28.1* 30.3* 26.6*  MCV 91.4 92.0 91.9 96.7 96.5  PLT 164 142* 107* 111* 80*    Cardiac Enzymes: No results for input(s): CKTOTAL, CKMB, CKMBINDEX, TROPONINI in  the last 168 hours.  Lipid Panel:  Recent Labs Lab 06/08/16 0456  TRIG 82    CBG:  Recent Labs Lab 06/10/16 0742 06/10/16 1123 06/10/16 1629 06/10/16 2356 06/11/16 0732  GLUCAP 93 104* 81 82 80    Microbiology: Results for orders placed or performed during the hospital encounter of 06/03/16  MRSA PCR Screening     Status: None   Collection Time: 06/03/16  4:36 PM  Result Value Ref Range Status   MRSA by PCR NEGATIVE NEGATIVE Final    Comment:        The GeneXpert MRSA Assay (FDA approved for NASAL specimens only), is one component of a comprehensive MRSA colonization surveillance program. It is not intended to diagnose MRSA infection nor to guide or monitor treatment for MRSA infections.     Coagulation Studies: No results for input(s): LABPROT, INR in the last 72 hours.  Imaging: Mr Kizzie Fantasia Contrast  06/10/2016   CLINICAL DATA:  55 year old hypertensive male with cirrhosis. History of intracranial hemorrhage, seizure in hepatic encephalopathy. Subsequent encounter. EXAM: MRI HEAD WITHOUT AND WITH CONTRAST TECHNIQUE: Multiplanar, multiecho pulse sequences of the brain and surrounding structures were obtained without and with intravenous contrast. CONTRAST:  42m MULTIHANCE GADOBENATE DIMEGLUMINE 529 MG/ML IV SOLN COMPARISON:  06/04/2016 and 05/17/2016 MR. 06/03/2016 and 05/16/2016 CT. FINDINGS: Progression of significantly abnormal appearance of the supra tentorial cortex with diffuse swelling and altered signal. Appearance is highly suspicious for diffuse anoxic injury, possibly from status epilepticus. On FLAIR imaging abnormal appearance of the sulci probably related to the fact the patient was intubated with increased oxygen tension. Blood and proteinaceous material (result of infection) can cause a similar appearance. Abnormality posterior medial right temporal lobe and occipital lobe of indeterminate etiology. This appears atypical for result of acute arterial infarct. No surrounding findings to suggest result of venous infarct. As this has progressed in a short period time, tumor felt unlikely and infection can not be excluded. Blood-stained cavity medial posterior frontal - parietal region where patient had prior hemorrhage minimally less prominent. T1 hyperintensity basal ganglia may reflect changes of liver disease. Major intracranial vascular structures are patent. Paranasal sinus mucosal thickening most notable left sphenoid sinus. Opacification mastoid air cells and middle ear cavity bilaterally. Pooling of secretions posterior superior nasopharynx. IMPRESSION: Markedly abnormal exam with findings suggestive of diffuse anoxia which may be related to the status epilepticus. On FLAIR imaging abnormal appearance of the sulci probably related to the fact the patient was intubated with increased oxygen tension. Blood  and proteinaceous material (result of infection) can cause a similar appearance. Abnormality posterior medial right temporal lobe and occipital lobe of indeterminate etiology. This appears atypical for result of acute arterial infarct. No surrounding findings to suggest result of venous infarct. As this has progressed in a short period time, tumor felt unlikely and infection can not be excluded. Blood-stained cavity medial posterior frontal - parietal region where patient had prior hemorrhage is minimally less prominent. Findings suggestive of hepatic encephalopathy. Paranasal sinus opacification most notable left sphenoid sinus. Opacification mastoid air cells and middle ear cavities bilaterally. Electronically Signed   By: SGenia DelM.D.   On: 06/10/2016 15:00   Dg Chest Port 1 View  06/10/2016  CLINICAL DATA:  Respiratory failure. EXAM: PORTABLE CHEST 1 VIEW COMPARISON:  06/08/2016. FINDINGS: Endotracheal tube, NG tube, left IJ line stable position. Heart size stable. Low lung volumes with mild bibasilar atelectasis and/or infiltrates again noted. Interim improvement small left pleural effusion.  No pneumothorax. Stable heart size. No acute bony abnormality. IMPRESSION: 1. Lines and tubes in stable position. 2. Persistent mild bibasilar atelectasis and/or infiltrates again noted. Small left pleural effusion has improved. Electronically Signed   By: Marcello Moores  Register   On: 06/10/2016 07:16    Medications:  I have reviewed the patient's current medications. Scheduled: . acyclovir  10 mg/kg Intravenous Q8H  . antiseptic oral rinse  7 mL Mouth Rinse 10 times per day  . chlorhexidine gluconate (SAGE KIT)  15 mL Mouth Rinse BID  . enoxaparin (LOVENOX) injection  40 mg Subcutaneous Q24H  . famotidine  20 mg Per Tube BID  . feeding supplement (PRO-STAT SUGAR FREE 64)  30 mL Per Tube TID  . free water  200 mL Per Tube Q4H  . lacosamide (VIMPAT) IV  200 mg Intravenous Q12H  . lactulose  30 g Per Tube BID   . levETIRAcetam  1,500 mg Intravenous Q12H  . rifaximin  550 mg Per Tube TID  . sodium chloride  250 mL Intravenous Once    Assessment/Plan: Remains on Keppra and Vimpat.  No clinical seizure activity noted.  Repeat MRI shows abnormal sulci on FLAIR imaging and cerebral edema diffusely.  Continued posterior temporal and occipital findings are present as well.  Diffuse anoxic injury and hepatic encephalopathy remain on the differential.    Recommendations: 1.  Agree with current management    LOS: 8 days   Alexis Goodell, MD Neurology [phone] 06/11/2016  3:05 PM

## 2016-06-11 NOTE — Plan of Care (Signed)
Problem: Skin Integrity: Goal: Risk for impaired skin integrity will decrease Outcome: Not Progressing Pt has old healed pressure ulcers that have now opened up again

## 2016-06-11 NOTE — Progress Notes (Signed)
RN confirmed that MD, Dr. Nicholos Johnsamachandran, had been informed by pharmacist about patient's platelet count and lovenox order. MD acknowledged and stated that RN was to continue to administer lovenox with current order.

## 2016-06-11 NOTE — Progress Notes (Signed)
[facility] bed control called to check on pt.  Still no beds available at this time.

## 2016-06-11 NOTE — Care Management (Addendum)
[facility] for approximately one week.  There was an available bed several days ago but patient was "bumped."  Spoke with patient's son  [place]ne.  Deniece PortelaWayne relays that he has not spoken with physicians because he feels "they don't care."   Says he is not always available to speak with physicians when they call because he is working.  Asked Deniece PortelaWayne what his impression is regarding what is going on with his ad and he replies that patient came in for seizures and that is all anyone is focused on.  He relays that patient has been at facility for one week and has not gotten [facility] where [facility] for patient.  Discussed with Deniece PortelaWayne that [facility] unless [facility] is an [facility].  [facility] very limited in ability to [facility] and is the only [facility] pursued.  Deniece PortelaWayne does agree to  [facility]  [facility]  It has been documented that patient prognosis is poor.  Nursing has verbally relayed that patient has not been declared brain dead but very close to meeting  the criteria.   CM made several attempts to determine son's understanding of patient's current status and prognosis but he does not offer any information regarding his understanding of patient's condition other than- staff do not care and  patient could receive more treatment at another facility.  CM did not pursue this discussion.  Deniece PortelaWayne says that "the only person he will talk to" is Enzo BiPaul LeBlanc- the director of the icu. It sounds as if Deniece PortelaWayne wishes to receive care updates from Mr Ernst BreachLeBlanc.  Discussed with Deniece PortelaWayne that we would strive to come up with a solution for communication with physicians  providing care.  May be of benefit to arrange face to face meeting. Updated Mr Ernst BreachLeBlanc and he will reach out to [place] this evening.  Updated nursing staff about [facility].  Updated Tomasa Randarol Harris with administration.

## 2016-06-11 NOTE — Progress Notes (Signed)
Per MD Kasa order labs for AM, do not address platelets of 80 at this time

## 2016-06-11 NOTE — Progress Notes (Signed)
PULMONARY / CRITICAL CARE MEDICINE   Name: Jon Morgan MRN: [id number] DOB: October 21, 1961    ADMISSION DATE:  06/03/2016  PT PROFILE: 33 M with h/o cirrhosis, CVAs seizure d/o presented to ED with status epilepticus and intubated in ED. Admitted by PCCM service  MAJOR EVENTS/TEST RESULTS: 06/12 Admitted via ED with status epilepticus, VDRF 06/12 CT head: Generalized atrophy with interval decrease in size of the low-attenuation focus seen previously at the high RIGHT frontal lobe compatible with evolving subacute infarction. New areas of low attenuation and loss of gray-white differentiation in the posterior RIGHT parietal and occipital lobes compatible with acute to subacute infarction. 06/13 Neuro consultation: recommended increased dose of Keppra. EEG ordered. MRI ordered 06/13 ammonia level > 500. Lactulose, rifaximin initiated 06/13 EEG: This is an abnormal EEG secondary to general background slowing. This finding may be seen with a diffuse disturbance that is etiologically nonspecific, but is consistent with the patient's current medications and may be related to the patient being post-ictal as well. No epileptiform activity is noted 06/13 MRI: New extensive cortically based restricted diffusion throughout both cerebral hemispheres which may reflect anoxic injury or the sequelae of recent seizure activity. Creutzfeldt-Jakob disease was considered but is felt unlikely given the clinical presentation and rapid development of these findings. New edema in the posteromedial right temporal and right occipital lobes suspicious for subacute PCA territory infarct. Slightly decreased size of subacute right frontoparietal hematoma 06/13 Pt's son requesting transfer to [facility]. Dr Donnie Aho @ [facility] accepted pt. Awaiting bed availability 06/13 Further seizure despite Keppra, propofol. Midazolam infusion initiated and Vimpat added 06/14 repeat EEG: This is an abnormal electroencephalogram due to the attenuated,  slow background. This activity is consistent with the patient's medications and can be seen in the postictal state, but can be seen with other pathologies as well. Clinical correlation recommended. No epileptiform activity is noted 06/15 Midazolam infusion stopped. Remains comatose on propofol infusion 06/16 Remained comatose. Propofol infusion discontinued. Repeat EEG: This is a technically difficult record due to the predominance of muscle and movement artifact. When able to be visualized the background activity was slow and poorly organized. There was some evidence of generalized polyspike activity that did appear distinct from artifact which is consistent with the patient's history of seizures. There was no evidence of electrographic seizures 06/17 Remained comatose. Neurology suspects "twitching is myoclonus. Repeat MIR brain ordered - cannot be done until 06/19 06/18 remains comatose. Periods of hypertension - PRN hydralazine ordered 6/20-remains intubated, MRI shows extensive brain injury-follow up neurology recs, gcs<8T  INDWELLING DEVICES:: ETT 06/12 >>  L IJ CVL 06/13 >>   MICRO DATA: MRSA PCR 06/12 >> NEG  PROCALCITONIN: 06/15: 0.27 06/16: 0.18 06/17: 0.24 06/18: 0.14  ANTIMICROBIALS:    SUBJECTIVE:  Comatose, remains intubated, off sedation prognosis is very poor, Updated Son yesterday who is unrealistic, does not want to listen what we have to say [facility] transfer pending    VITAL SIGNS: BP 116/67 mmHg  Pulse 89  Temp(Src) 97.6 F (36.4 C) (Axillary)  Resp 22  Ht  (1.854 m)  Wt 198 lb 6.6 oz (90 kg)  BMI 26.18 kg/m2  SpO2 100%     VENTILATOR SETTINGS: Vent Mode:  [-] PRVC FiO2 (%):  [35 %] 35 % Set Rate:  [16 bmp] 16 bmp Vt Set:  [550 mL] 550 mL PEEP:  [5 cmH20] 5 cmH20  INTAKE / OUTPUT: I/O last 3 completed shifts: In: 3636.5 [NG/GT:3156.5; IV Piggyback:480] Out: 3520 [Urine:1820; Stool:1700]  PHYSICAL EXAMINATION: General: Intubated,  comatose, acutely ill looking Neuro: pupils equal, non-reactive, no corneal reflexes  HEENT: NCAT, sclera icterus, ETT/OGT Cardiovascular: tachycardic, regular, no M Lungs:Coarse, no wheezes  Abdomen:  soft, distended, hypoactive BS Ext: warm, no edema  LABS:  BMET  Recent Labs Lab 06/08/16 0456 06/09/16 0544 06/10/16 0549  NA 143 143 145  K 4.1 4.9 5.0  CL 119* 120* 125*  CO2 19* 19* 16*  BUN 37* 38* 42*  CREATININE 0.88 0.73 0.89  GLUCOSE 98 97 104*    Electrolytes  Recent Labs Lab 06/04/16 1126 06/04/16 2020 06/05/16 0504  06/08/16 0456 06/09/16 0544 06/10/16 0549  CALCIUM  --   --  9.0  < > 9.1 9.2 10.0  MG 1.8 2.0 1.9  --   --   --   --   PHOS 3.5 5.7* 7.0*  --   --   --   --   < > = values in this interval not displayed.  CBC  Recent Labs Lab 06/06/16 0435 06/08/16 0456 06/10/16 0549  WBC 10.2 11.1* 8.6  HGB 9.0* 9.3* 9.6*  HCT 27.3* 28.1* 30.3*  PLT 142* 107* 111*    Coag's  Recent Labs Lab 06/07/16 0431  APTT 56*  INR 2.19    Sepsis Markers  Recent Labs Lab 06/07/16 1212 06/08/16 0456 06/09/16 0544  PROCALCITON 0.18 0.24 0.14    ABG  Recent Labs Lab 06/04/16 1554 06/06/16 2032 06/10/16 0632  PHART 7.42 7.43 7.44  PCO2ART 25* 27* 24*  PO2ART 106 72* 141*    Liver Enzymes  Recent Labs Lab 06/07/16 0431 06/08/16 0456 06/10/16 0549  AST 60* 58* 86*  ALT 23 22 29   ALKPHOS 51 51 76  BILITOT 5.4* 6.3* 4.9*  ALBUMIN 2.1* 2.0* 2.1*    Cardiac Enzymes No results for input(s): TROPONINI, PROBNP in the last 168 hours.  Glucose  Recent Labs Lab 06/09/16 2356 06/10/16 0742 06/10/16 1123 06/10/16 1629 06/10/16 2356 06/11/16 0732  GLUCAP 99 93 104* 81 82 80     ASSESSMENT AND PLAN:  55 yo AAF with multiple medical issues with very poor prognosis is brain injury from acute CVA edema, with hepatic cirrhosis-prognosis is very poor, recommend DNR status and comfort care measures. 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  Poor prognosis for neurological recovery P:   Neurology following Continue Keppra 1500 mg IV BID, Vimpat 100 mg IV BID Cont lactulose and rifaximin Daily WUA  Monitor ammonia level intermitttently -start high dose acyclovir  PULMONARY A: VDRF due to AMS Primary respiratory alkalosis due to hepatic encephalopathy P:   Cont vent support - settings reviewed and/or adjusted-TV increased to 550 ABG this am Cont vent bundle  Daily SBT if/when meets criteria  CARDIOVASCULAR A:  History of hypertension Tachycardia Hypotension - resolved Hypertension Sinus tachycardia P: MAP goal > 70 mmHg  PRN hydralazine to maintain SBP < 160 mmHg PRN metoprolol to maintain HR < 115/min  RENAL A:   AKI, resolved Recurrent hypokalemia-resolved P:   Monitor BMET intermittently Monitor I/Os Correct electrolytes as indicated Free water flushes with TFs D/C maintenance fluids  GASTROINTESTINAL A:   Severe cirrhosis Markedly elevated ammonia P:   SUP: enteral famotidine Cont TF per protocol  HEMATOLOGIC A:   Anemia without acute blood loss 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 Thrombocytopenia - suspect related to liver dz and hypersplenism  P:  DVT px: SCDs Monitor CBC intermittently Transfuse per usual guidelines  INFECTIOUS A:   No acute issues Minimally elevated PCT P:   Monitor temp, WBC count Micro and abx as above  ENDOCRINE A:   Episodic hypoglycemia due to liver failure and renal failure - resolved P:   Monitor CBGs Avoid insulin unless glucose > 200  I have personally obtained a history, examined the patient, evaluated Pertinent laboratory and [employer]/imaging results, and  formulated the assessment and plan   The Patient requires high complexity decision making for assessment and support, frequent evaluation and titration of  therapies, application of advanced monitoring technologies and extensive interpretation of multiple databases. Critical Care Time devoted to patient care services described in this note is 40 minutes.   Overall, patient is critically ill, prognosis is guarded.  Patient with Multiorgan failure and at high risk for cardiac arrest and death.    Lucie Leather, M.D.  [facility]  Medical Director [facility] Medical Director [facility] Cardio-Pulmonary Department

## 2016-06-12 ENCOUNTER — Ambulatory Visit (HOSPITAL_COMMUNITY)
Admission: AD | Admit: 2016-06-12 | Discharge: 2016-06-12 | Disposition: A | Discharge status: Home / Self Care | Payer: Medicaid Other | Source: Other Acute Inpatient Hospital | Attending: Pulmonary Disease | Admitting: Pulmonary Disease

## 2016-06-12 DIAGNOSIS — J9691 Respiratory failure, unspecified with hypoxia: Secondary | ICD-10-CM | POA: Insufficient documentation

## 2016-06-12 LAB — BASIC METABOLIC PANEL
Anion gap: 3 — ABNORMAL LOW (ref 5–15)
BUN: 40 mg/dL — AB (ref 6–20)
CO2: 20 mmol/L — ABNORMAL LOW (ref 22–32)
CREATININE: 0.6 mg/dL — AB (ref 0.61–1.24)
Calcium: 9.4 mg/dL (ref 8.9–10.3)
Chloride: 122 mmol/L — ABNORMAL HIGH (ref 101–111)
GFR calc Af Amer: >60 mL/min (ref >60)
GLUCOSE: 123 mg/dL — AB (ref 65–99)
Potassium: 5 mmol/L (ref 3.5–5.1)
SODIUM: 145 mmol/L (ref 135–145)

## 2016-06-12 LAB — CBC
HCT: 26.5 % — ABNORMAL LOW (ref 40.0–52.0)
Hemoglobin: 8.8 g/dL — ABNORMAL LOW (ref 13.0–18.0)
MCH: 31.8 pg (ref 26.0–34.0)
MCHC: 33.3 g/dL (ref 32.0–36.0)
MCV: 95.7 fL (ref 80.0–100.0)
PLATELETS: 89 10*3/uL — AB (ref 150–440)
RBC: 2.77 MIL/uL — ABNORMAL LOW (ref 4.40–5.90)
RDW: 28.7 % — AB (ref 11.5–14.5)
WBC: 7.1 10*3/uL (ref 3.8–10.6)

## 2016-06-12 LAB — PHOSPHORUS: Phosphorus: 2.7 mg/dL (ref 2.5–4.6)

## 2016-06-12 LAB — GLUCOSE, CAPILLARY
Glucose-Capillary: 111 mg/dL — ABNORMAL HIGH (ref 65–99)
Glucose-Capillary: 112 mg/dL — ABNORMAL HIGH (ref 65–99)

## 2016-06-12 LAB — MAGNESIUM: MAGNESIUM: 2 mg/dL (ref 1.7–2.4)

## 2016-06-12 NOTE — Progress Notes (Signed)
Patient left ICU by stretcher with [facility] transport team.  [facility]

## 2016-06-12 NOTE — Progress Notes (Signed)
PULMONARY / CRITICAL CARE MEDICINE   Name: Jon Morgan MRN: [id number] DOB: 1961/03/22    ADMISSION DATE:  06/03/2016  PT PROFILE: 55 M with h/o cirrhosis, CVAs seizure d/o presented to ED with status epilepticus and intubated in ED. Admitted by PCCM service  MAJOR EVENTS/TEST RESULTS: 06/12 Admitted via ED with status epilepticus, VDRF 06/12 CT head: Generalized atrophy with interval decrease in size of the low-attenuation focus seen previously at the high RIGHT frontal lobe compatible with evolving subacute infarction. New areas of low attenuation and loss of gray-white differentiation in the posterior RIGHT parietal and occipital lobes compatible with acute to subacute infarction. 06/13 Neuro consultation: recommended increased dose of Keppra. EEG ordered. MRI ordered 06/13 ammonia level > 500. Lactulose, rifaximin initiated 06/13 EEG: This is an abnormal EEG secondary to general background slowing. This finding may be seen with a diffuse disturbance that is etiologically nonspecific, but is consistent with the patient's current medications and may be related to the patient being post-ictal as well. No epileptiform activity is noted 06/13 MRI: New extensive cortically based restricted diffusion throughout both cerebral hemispheres which may reflect anoxic injury or the sequelae of recent seizure activity. Creutzfeldt-Jakob disease was considered but is felt unlikely given the clinical presentation and rapid development of these findings. New edema in the posteromedial right temporal and right occipital lobes suspicious for subacute PCA territory infarct. Slightly decreased size of subacute right frontoparietal hematoma 06/13 Pt's son requesting transfer to [facility]. Dr Donnie Aho @ [facility] accepted pt. Awaiting bed availability 06/13 Further seizure despite Keppra, propofol. Midazolam infusion initiated and Vimpat added 06/14 repeat EEG: This is an abnormal electroencephalogram due to the attenuated,  slow background. This activity is consistent with the patient's medications and can be seen in the postictal state, but can be seen with other pathologies as well. Clinical correlation recommended. No epileptiform activity is noted 06/15 Midazolam infusion stopped. Remains comatose on propofol infusion 06/16 Remained comatose. Propofol infusion discontinued. Repeat EEG: This is a technically difficult record due to the predominance of muscle and movement artifact. When able to be visualized the background activity was slow and poorly organized. There was some evidence of generalized polyspike activity that did appear distinct from artifact which is consistent with the patient's history of seizures. There was no evidence of electrographic seizures 06/17 Remained comatose. Neurology suspects "twitching is myoclonus. Repeat MIR brain ordered - cannot be done until 06/19 06/18 remains comatose. Periods of hypertension - PRN hydralazine ordered 6/20-remains intubated, MRI shows extensive brain injury-follow up neurology recs, gcs<8T 6/21 remains comatosed, Neuology states very poor prognosis, son is irrational  INDWELLING DEVICES:: ETT 06/12 >>  L IJ CVL 06/13 >>   MICRO DATA: MRSA PCR 06/12 >> NEG  PROCALCITONIN: 06/15: 0.27 06/16: 0.18 06/17: 0.24 06/18: 0.14  ANTIMICROBIALS:    SUBJECTIVE:  Comatose, remains intubated, off sedation prognosis is very poor, Updated Son yesterday who is unrealistic, does not want to listen what we have to say [facility] transfer pending    VITAL SIGNS: BP 156/79 mmHg  Pulse 98  Temp(Src) 98.1 F (36.7 C) (Oral)  Resp 25  Ht 6' (1.829 m)  Wt 203 lb 4.2 oz (92.2 kg)  BMI 27.56 kg/m2  SpO2 100%     VENTILATOR SETTINGS: Vent Mode:  [-] PRVC FiO2 (%):  [30 %] 30 % Set Rate:  [16 bmp] 16 bmp Vt Set:  [550 mL] 550 mL PEEP:  [5 cmH20] 5 cmH20 Plateau Pressure:  [16 cmH20-18 cmH20] 18 cmH20  INTAKE / OUTPUT: I/O last 3 completed shifts: In:  4792.9 [NG/GT:4029.9; IV Piggyback:763] Out: 2890 [Urine:1965; Stool:925]  PHYSICAL EXAMINATION: General: Intubated, comatose, acutely ill looking Neuro: pupils equal, non-reactive, no corneal reflexes  HEENT: NCAT, sclera icterus, ETT/OGT Cardiovascular: tachycardic, regular, no M Lungs:Coarse, no wheezes  Abdomen:  soft, distended, hypoactive BS Ext: warm, no edema  LABS:  BMET  Recent Labs Lab 06/10/16 0549 06/11/16 1102 06/12/16 0545  NA 145 148* 145  K 5.0 4.1 5.0  CL 125* 126* 122*  CO2 16* 20* 20*  BUN 42* 41* 40*  CREATININE 0.89 0.53* 0.60*  GLUCOSE 104* 100* 123*    Electrolytes  Recent Labs Lab 06/10/16 0549 06/11/16 1102 06/12/16 0545  CALCIUM 10.0 9.5 9.4  MG  --  2.0 2.0  PHOS  --  2.8 2.7    CBC  Recent Labs Lab 06/10/16 0549 06/11/16 1102 06/12/16 0545  WBC 8.6 7.6 7.1  HGB 9.6* 8.8* 8.8*  HCT 30.3* 26.6* 26.5*  PLT 111* 80* 89*    Coag's  Recent Labs Lab 06/07/16 0431  APTT 56*  INR 2.19    Sepsis Markers  Recent Labs Lab 06/07/16 1212 06/08/16 0456 06/09/16 0544  PROCALCITON 0.18 0.24 0.14    ABG  Recent Labs Lab 06/06/16 2032 06/10/16 0632  PHART 7.43 7.44  PCO2ART 27* 24*  PO2ART 72* 141*    Liver Enzymes  Recent Labs Lab 06/07/16 0431 06/08/16 0456 06/10/16 0549  AST 60* 58* 86*  ALT 23 22 29   ALKPHOS 51 51 76  BILITOT 5.4* 6.3* 4.9*  ALBUMIN 2.1* 2.0* 2.1*    Cardiac Enzymes No results for input(s): TROPONINI, PROBNP in the last 168 hours.  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  Poor prognosis for neurological recovery P:   Neurology following Continue Keppra 1500 mg IV BID, Vimpat 100 mg IV BID Cont lactulose and rifaximin Daily WUA  Monitor ammonia level intermitttently -started high dose acyclovir willset stop date  PULMONARY A: VDRF due to AMS Primary respiratory alkalosis due to hepatic encephalopathy P:   Cont vent support - settings reviewed and/or adjusted-TV increased to 550 ABG this am Cont vent bundle  -will need trach and PEG tube for survival  CARDIOVASCULAR A:  History of hypertension Tachycardia Hypotension - resolved Hypertension Sinus tachycardia P: MAP goal > 70 mmHg  PRN hydralazine to maintain SBP < 160 mmHg PRN metoprolol to maintain HR < 115/min  RENAL A:   AKI, resolved Recurrent hypokalemia-resolved P:   Monitor BMET intermittently Monitor I/Os Correct electrolytes as indicated Free water flushes with TFs D/C maintenance fluids  GASTROINTESTINAL A:   Severe cirrhosis Markedly elevated ammonia P:   SUP: enteral famotidine Cont TF per protocol  HEMATOLOGIC A:   Anemia without acute blood loss 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 Thrombocytopenia - suspect related to liver dz and hypersplenism  P:  DVT px: SCDs Monitor CBC intermittently Transfuse per usual guidelines  INFECTIOUS A:   No acute issues Minimally elevated PCT P:   Monitor  temp, WBC count Micro and abx as above  ENDOCRINE A:   Episodic hypoglycemia due to liver failure and renal failure - resolved P:   Monitor CBGs Avoid insulin unless glucose > 200   The Patient requires high complexity decision making for assessment and support, frequent evaluation and titration of therapies, application of advanced monitoring technologies and extensive interpretation of multiple databases. Critical Care Time devoted to patient care services described in this note is  40 minutes.   Overall, patient is critically ill, prognosis is guarded.  Patient with Multiorgan failure and at high risk for cardiac arrest and death.    Lucie LeatherKurian David Kerim Statzer, M.D.  [facility]  Medical Director [facility] Medical Director [facility] Cardio[facility]

## 2016-06-12 NOTE — Progress Notes (Signed)
PARENTERAL NUTRITION CONSULT NOTE - INITIAL  Pharmacy Consult for Electrolyte Monitoring and Replacement    No Known Allergies  Patient Measurements: Height: 6' (182.9 cm) Weight: 203 lb 4.2 oz (92.2 kg) IBW/kg (Calculated) : 77.6  Vital Signs: Temp: 98.8 F (37.1 C) (06/21 0811) Temp Source: Oral (06/21 0811) BP: 142/109 mmHg (06/21 1000) Pulse Rate: 93 (06/21 1000) Intake/Output from previous day: 06/20 0701 - 06/21 0700 In: 3048 [NG/GT:2560; IV Piggyback:488] Out: 3005 [Urine:1120; Stool:225] Intake/Output from this shift: Total I/O In: 395 [NG/GT:395] Out: -   Labs:  Recent Labs  06/10/16 0549 06/11/16 1102 06/12/16 0545  WBC 8.6 7.6 7.1  HGB 9.6* 8.8* 8.8*  HCT 30.3* 26.6* 26.5*  PLT 111* 80* 89*     Recent Labs  06/10/16 0549 06/11/16 1102 06/12/16 0545  NA 145 148* 145  K 5.0 4.1 5.0  CL 125* 126* 122*  CO2 16* 20* 20*  GLUCOSE 104* 100* 123*  BUN 42* 41* 40*  CREATININE 0.89 0.53* 0.60*  CALCIUM 10.0 9.5 9.4  MG  --  2.0 2.0  PHOS  --  2.8 2.7  PROT 7.4  --   --   ALBUMIN 2.1*  --   --   AST 86*  --   --   ALT 29  --   --   ALKPHOS 76  --   --   BILITOT 4.9*  --   --   BILIDIR 2.5*  --   --   IBILI 2.4*  --   --    Estimated Creatinine Clearance: 117.2 mL/min (by C-G formula based on Cr of 0.6).    Recent Labs  06/11/16 1655 06/12/16 0014 06/12/16 0713  GLUCAP 73 112* 111*    Medical History: Past Medical History  Diagnosis Date  . Hypertension   . Hepatic encephalopathy ([place])   . Seizure ([place])   . Intracranial hemorrhage ([place])   . Liver cirrhosis ([facility])     Medications:  Scheduled:  . antiseptic oral rinse  7 mL Mouth Rinse 10 times per day  . chlorhexidine gluconate (SAGE KIT)  15 mL Mouth Rinse BID  . enoxaparin (LOVENOX) injection  40 mg Subcutaneous Q24H  . famotidine  20 mg Per Tube BID  . feeding supplement (PRO-STAT SUGAR FREE 64)  30 mL Per Tube TID  . free water  200 mL Per Tube Q4H  . lacosamide (VIMPAT)  IV  200 mg Intravenous Q12H  . lactulose  30 g Per Tube BID  . levETIRAcetam  1,500 mg Intravenous Q12H  . methylPREDNISolone (SOLU-MEDROL) injection  60 mg Intravenous Q6H  . rifaximin  550 mg Per Tube TID  . sodium chloride  250 mL Intravenous Once   Infusions:  . feeding supplement (VITAL AF 1.2 CAL) 1,000 mL (06/12/16 0600)    Assessment: Pharmacy consulted to assist in managing electrolytes in this 55 y/o M admitted with status epilepticus.   Plan:  Electrolytes are WNL. Will f/u AM labs.   Ulice Dash D 06/12/2016,10:49 AM

## 2016-06-12 NOTE — Discharge Summary (Signed)
PULMONARY / CRITICAL CARE MEDICINE DISCHARGE SUMMARY   Name: Jon Morgan MRN: [id number] DOB: 03-03-1961    ADMISSION DATE:  06/03/2016  PT PROFILE: Admitted on 06/03/16  55 M with h/o cirrhosis, CVAs seizure d/o presented to ED with status epilepticus and intubated in ED. Admitted by PCCM service Intubated for acute resp failure, worsening liver disease  MAJOR EVENTS/TEST RESULTS: 06/12 Admitted via ED with status epilepticus, VDRF 06/12 CT head: Generalized atrophy with interval decrease in size of the low-attenuation focus seen previously at the high RIGHT frontal lobe compatible with evolving subacute infarction. New areas of low attenuation and loss of gray-white differentiation in the posterior RIGHT parietal and occipital lobes compatible with acute to subacute infarction. 06/13 Neuro consultation: recommended increased dose of Keppra. EEG ordered. MRI ordered 06/13 ammonia level > 500. Lactulose, rifaximin initiated 06/13 EEG: This is an abnormal EEG secondary to general background slowing. This finding may be seen with a diffuse disturbance that is etiologically nonspecific, but is consistent with the patient's current medications and may be related to the patient being post-ictal as well. No epileptiform activity is noted 06/13 MRI: New extensive cortically based restricted diffusion throughout both cerebral hemispheres which may reflect anoxic injury or the sequelae of recent seizure activity. Creutzfeldt-Jakob disease was considered but is felt unlikely given the clinical presentation and rapid development of these findings. New edema in the posteromedial right temporal and right occipital lobes suspicious for subacute PCA territory infarct. Slightly decreased size of subacute right frontoparietal hematoma 06/13 Pt's son requesting transfer to [facility]. Dr Wynonia Lawman @ [facility] accepted pt. Awaiting bed availability 06/13 Further seizure despite Keppra, propofol. Midazolam infusion initiated and  Vimpat added 06/14 repeat EEG: This is an abnormal electroencephalogram due to the attenuated, slow background. This activity is consistent with the patient's medications and can be seen in the postictal state, but can be seen with other pathologies as well. Clinical correlation recommended. No epileptiform activity is noted 06/15 Midazolam infusion stopped. Remains comatose on propofol infusion 06/16 Remained comatose. Propofol infusion discontinued. Repeat EEG: This is a technically difficult record due to the predominance of muscle and movement artifact. When able to be visualized the background activity was slow and poorly organized. There was some evidence of generalized polyspike activity that did appear distinct from artifact which is consistent with the patient's history of seizures. There was no evidence of electrographic seizures 06/17 Remained comatose. Neurology suspects "twitching is myoclonus. Repeat MIR brain ordered - cannot be done until 06/19 06/18 remains comatose. Periods of hypertension - PRN hydralazine ordered 6/20-remains intubated, MRI shows extensive brain injury-follow up neurology recs, gcs<8T started acyclovir 6/21 remains comatosed, Neuology states very poor prognosis, son is irrational,  6/21- [facility] has bed available will be transferred today. Neuro exam-patient breathing over set rate, no evidence of brain death  INDWELLING DEVICES:: ETT 06/12 >>  L IJ CVL 06/13 >>   MICRO DATA: MRSA PCR 06/12 >> NEG  PROCALCITONIN: 06/15: 0.27 06/16: 0.18 06/17: 0.24 06/18: 0.14  ANTIMICROBIALS:  9/20 acycolvir   SUBJECTIVE:  Comatose, remains intubated, off sedation prognosis is very poor, Updated Son yesterday who is unrealistic, does not want to listen what we have to say [facility] transfer pending, intermittent facial twitching    VITAL SIGNS: BP 142/109 mmHg  Pulse 93  Temp(Src) 98.8 F (37.1 C) (Oral)  Resp 29  Ht 6' (1.829 m)  Wt 203 lb 4.2 oz (92.2 kg)   BMI 27.56 kg/m2  SpO2 100%  VENTILATOR SETTINGS: Vent Mode:  [-] PRVC FiO2 (%):  [30 %] 30 % Set Rate:  [16 bmp] 16 bmp Vt Set:  [550 mL] 550 mL PEEP:  [5 cmH20] 5 cmH20 Plateau Pressure:  [14 cmH20-18 cmH20] 14 cmH20  INTAKE / OUTPUT: I/O last 3 completed shifts: In: 4792.9 [NG/GT:4029.9; IV Piggyback:763] Out: 2890 [Urine:1965; Stool:925]  PHYSICAL EXAMINATION: General: Intubated, comatose, acutely ill looking Neuro: pupils equal, non-reactive, no corneal reflexes  HEENT: NCAT, sclera icterus, ETT/OGT Cardiovascular: tachycardic, regular, no M Lungs:Coarse, no wheezes  Abdomen:  soft, distended, hypoactive BS Ext: warm, no edema  CURRENT MEDS  Current facility-administered medications:  .  acetaminophen (TYLENOL) tablet 650 mg, 650 mg, Per NG tube, Q6H PRN, Mikael Spray, NP, 650 mg at 06/10/16 0417 .  antiseptic oral rinse solution (CORINZ), 7 mL, Mouth Rinse, 10 times per day, Wilhelmina Mcardle, MD, 7 mL at 06/12/16 0553 .  chlorhexidine gluconate (SAGE KIT) (PERIDEX) 0.12 % solution 15 mL, 15 mL, Mouth Rinse, BID, Wilhelmina Mcardle, MD, 15 mL at 06/12/16 0829 .  diphenhydrAMINE (BENADRYL) injection 50 mg, 50 mg, Intravenous, Q6H PRN, Bincy S Varughese, NP, 50 mg at 06/11/16 1622 .  enoxaparin (LOVENOX) injection 40 mg, 40 mg, Subcutaneous, Q24H, Bincy S Varughese, NP, 40 mg at 06/11/16 1623 .  famotidine (PEPCID) 40 MG/5ML suspension 20 mg, 20 mg, Per Tube, BID, Wilhelmina Mcardle, MD, 20 mg at 06/11/16 2109 .  feeding supplement (PRO-STAT SUGAR FREE 64) liquid 30 mL, 30 mL, Per Tube, TID, Wilhelmina Mcardle, MD, 30 mL at 06/11/16 2109 .  feeding supplement (VITAL AF 1.2 CAL) liquid 1,000 mL, 1,000 mL, Per Tube, Continuous, Flora Lipps, MD, Last Rate: 65 mL/hr at 06/12/16 0600, 1,000 mL at 06/12/16 0600 .  fentaNYL (SUBLIMAZE) injection 100 mcg, 100 mcg, Intravenous, Q15 min PRN, Mikael Spray, NP, 100 mcg at 06/12/16 0005 .  fentaNYL (SUBLIMAZE) injection 100 mcg, 100  mcg, Intravenous, Q2H PRN, Mikael Spray, NP, 100 mcg at 06/12/16 4401 .  free water 200 mL, 200 mL, Per Tube, Q4H, Flora Lipps, MD, 200 mL at 06/12/16 0829 .  hydrALAZINE (APRESOLINE) injection 10-20 mg, 10-20 mg, Intravenous, Q4H PRN, Wilhelmina Mcardle, MD, 20 mg at 06/12/16 0340 .  ipratropium-albuterol (DUONEB) 0.5-2.5 (3) MG/3ML nebulizer solution 3 mL, 3 mL, Nebulization, Q4H PRN, Bincy S Varughese, NP .  lacosamide (VIMPAT) 200 mg in sodium chloride 0.9 % 25 mL IVPB, 200 mg, Intravenous, Q12H, Alexis Goodell, MD, 200 mg at 06/11/16 2218 .  lactulose (CHRONULAC) 10 GM/15ML solution 30 g, 30 g, Per Tube, BID, Wilhelmina Mcardle, MD, 30 g at 06/11/16 2107 .  levETIRAcetam (KEPPRA) 1,500 mg in sodium chloride 0.9 % 100 mL IVPB, 1,500 mg, Intravenous, Q12H, Bincy S Varughese, NP, 1,500 mg at 06/11/16 2342 .  LORazepam (ATIVAN) injection 1 mg, 1 mg, Intravenous, PRN, Wilhelmina Mcardle, MD, 1 mg at 06/10/16 1613 .  methylPREDNISolone sodium succinate (SOLU-MEDROL) 125 mg/2 mL injection 60 mg, 60 mg, Intravenous, Q6H, Bincy S Varughese, NP, 60 mg at 06/12/16 0348 .  metoprolol (LOPRESSOR) injection 2.5-5 mg, 2.5-5 mg, Intravenous, Q3H PRN, Wilhelmina Mcardle, MD, 5 mg at 06/12/16 0542 .  midazolam (VERSED) injection 2 mg, 2 mg, Intravenous, Q2H PRN, Mikael Spray, NP, 2 mg at 06/12/16 0336 .  rifaximin (XIFAXAN) tablet 550 mg, 550 mg, Per Tube, TID, Wilhelmina Mcardle, MD, 550 mg at 06/11/16 2108 .  sodium chloride 0.9 % bolus 250 mL, 250  mL, Intravenous, Once, Holley Raring, NP, Stopped at 06/04/16 1615   BMET  Recent Labs Lab 06/10/16 0549 06/11/16 1102 06/12/16 0545  NA 145 148* 145  K 5.0 4.1 5.0  CL 125* 126* 122*  CO2 16* 20* 20*  BUN 42* 41* 40*  CREATININE 0.89 0.53* 0.60*  GLUCOSE 104* 100* 123*    Electrolytes  Recent Labs Lab 06/10/16 0549 06/11/16 1102 06/12/16 0545  CALCIUM 10.0 9.5 9.4  MG  --  2.0 2.0  PHOS  --  2.8 2.7    CBC  Recent Labs Lab  06/10/16 0549 06/11/16 1102 06/12/16 0545  WBC 8.6 7.6 7.1  HGB 9.6* 8.8* 8.8*  HCT 30.3* 26.6* 26.5*  PLT 111* 80* 89*    Coag's  Recent Labs Lab 06/07/16 0431  APTT 56*  INR 2.19    Sepsis Markers  Recent Labs Lab 06/07/16 1212 06/08/16 0456 06/09/16 0544  PROCALCITON 0.18 0.24 0.14    ABG  Recent Labs Lab 06/06/16 2032 06/10/16 0632  PHART 7.43 7.44  PCO2ART 27* 24*  PO2ART 72* 141*    Liver Enzymes  Recent Labs Lab 06/07/16 0431 06/08/16 0456 06/10/16 0549  AST 60* 58* 86*  ALT '23 22 29  ' ALKPHOS 51 51 76  BILITOT 5.4* 6.3* 4.9*  ALBUMIN 2.1* 2.0* 2.1*    Cardiac Enzymes No results for input(s): TROPONINI, PROBNP in the last 168 hours.  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, with hepatic cirrhosis-prognosis is very poor,  Son still requesting transfer to [place] A:   Status epilepticus, refractory Hepatic encephalopathy 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  Poor prognosis for neurological recovery P:   Neurology following Continue Keppra 1500 mg IV BID, Vimpat 100 mg IV BID Cont lactulose and rifaximin Daily WUA  Monitor ammonia level intermitttently -started high dose acyclovir willset stop date  PULMONARY A: VDRF due to AMS Primary respiratory alkalosis due to hepatic encephalopathy P:   Cont vent support - settings reviewed and/or adjusted-TV increased to 550 ABG this am Cont vent bundle  -will need trach and PEG tube for survival  CARDIOVASCULAR A:  History of hypertension Tachycardia Hypotension - resolved Hypertension Sinus tachycardia P: MAP goal > 70 mmHg  PRN hydralazine to maintain SBP < 160 mmHg PRN metoprolol to maintain HR < 115/min  RENAL A:    AKI, resolved Recurrent hypokalemia-resolved P:   Monitor BMET intermittently Monitor I/Os Correct electrolytes as indicated Free water flushes with TFs D/C maintenance fluids  GASTROINTESTINAL A:   Severe cirrhosis Markedly elevated ammonia P:   SUP: enteral famotidine Cont TF per protocol  HEMATOLOGIC A:   Anemia without acute blood loss 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 Thrombocytopenia - suspect related to liver dz and hypersplenism  P:  DVT px: SCDs Monitor CBC intermittently Transfuse per usual guidelines  INFECTIOUS A:   No acute issues Minimally elevated PCT P:   Monitor temp, WBC count Micro and abx as above  ENDOCRINE A:   Episodic hypoglycemia due to liver failure and renal failure - resolved P:   Monitor CBGs Avoid insulin unless glucose > 200      Patient with Multiorgan failure and at high risk for cardiac arrest and death.  Patient to be transferred to [facility] for further care and management   Corrin Parker, M.D.  [facility]  Medical Director [place] Director [facility] Cardio-Pulmonary Department

## 2016-06-12 NOTE — Progress Notes (Signed)
[facility]

## 2016-06-12 NOTE — Progress Notes (Signed)
Report called to Wyatt Mageabitha, [facility].  [facility].

## 2016-06-12 NOTE — Care Management (Signed)
[facility]

## 2016-06-12 NOTE — Progress Notes (Signed)
Report given to Casimiro NeedleMichael, paramedic with [facility].

## 2016-07-23 DEATH — deceased

## 2017-12-02 IMAGING — CT CT HEAD W/O CM
4 series · 18 of 30 positions shown, 19 images · non-contrast
Comparison: None.

CLINICAL DATA: Seizure, unresponsive.

EXAM:
CT HEAD WITHOUT CONTRAST
TECHNIQUE: Contiguous axial images were obtained from the base of the skull
through the vertex without intravenous contrast.

[Series 2: head wo · axial · 0.42mm/px · z∈[+651,+696]mm · 2 of 28 slices shown, 3 images]
[im 10/28  brain]
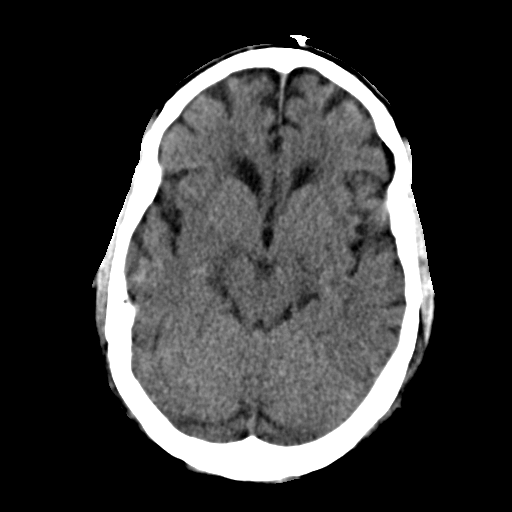
[im 10/28  bone]
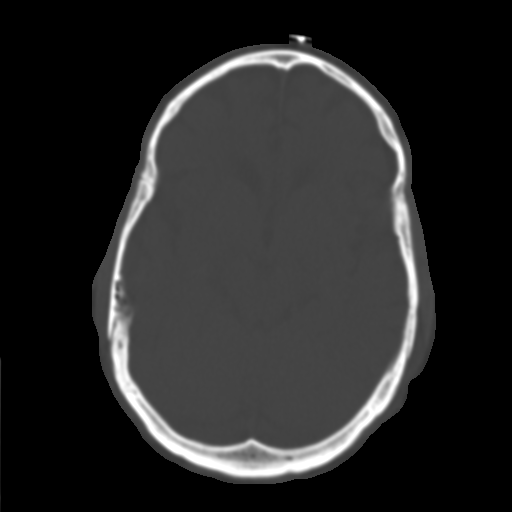
[im 19/28  brain]
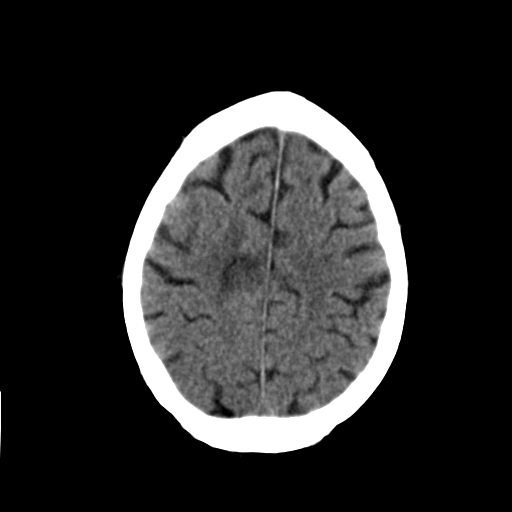

[Series 3: head bone · axial · 0.42mm/px · z∈[+618,+730]mm · 8 of 70 slices shown]
[im 7/70  bone]
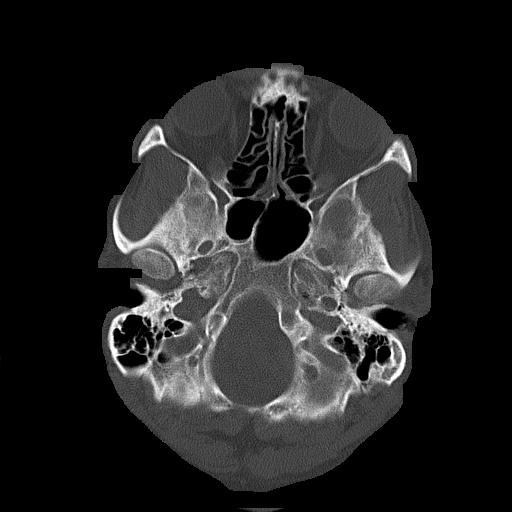
[im 14/70  bone]
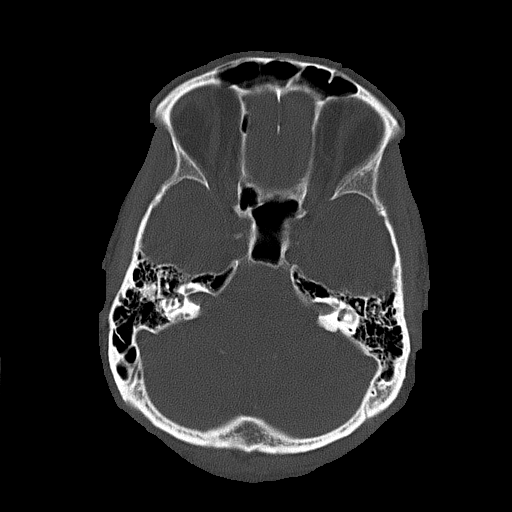
[im 21/70  bone]
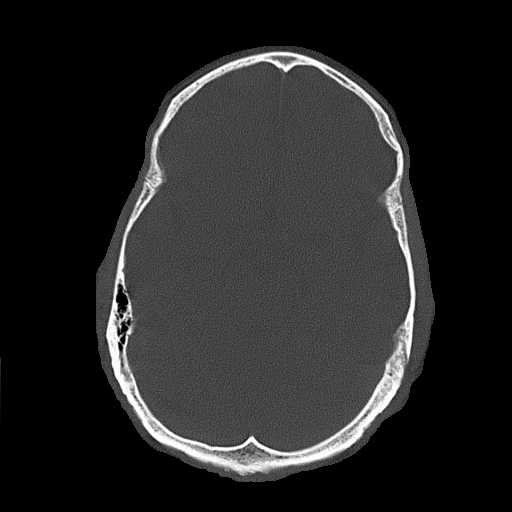
[im 28/70  bone]
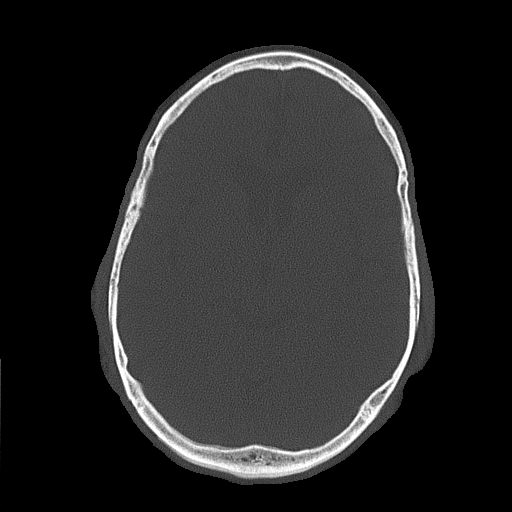
[im 42/70  bone]
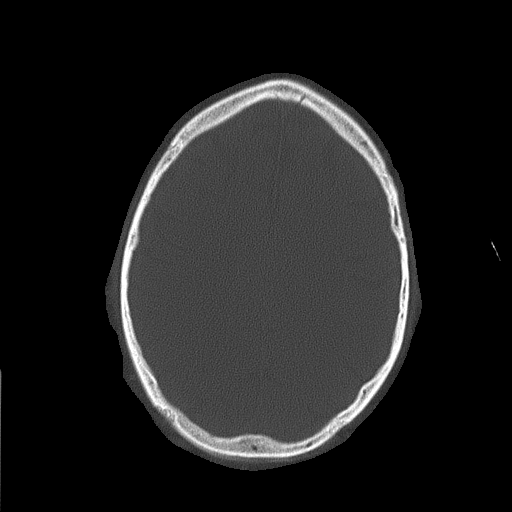
[im 49/70  bone]
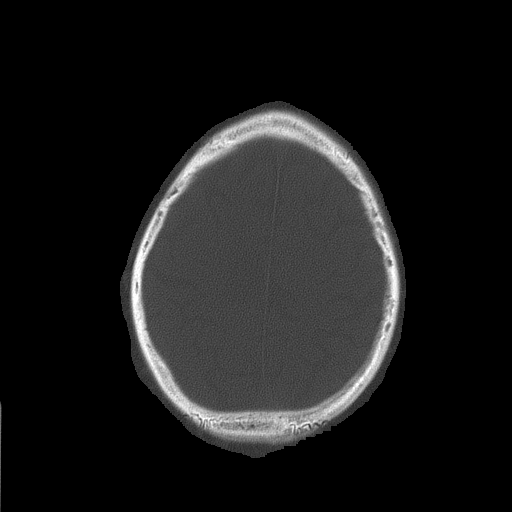
[im 56/70  bone]
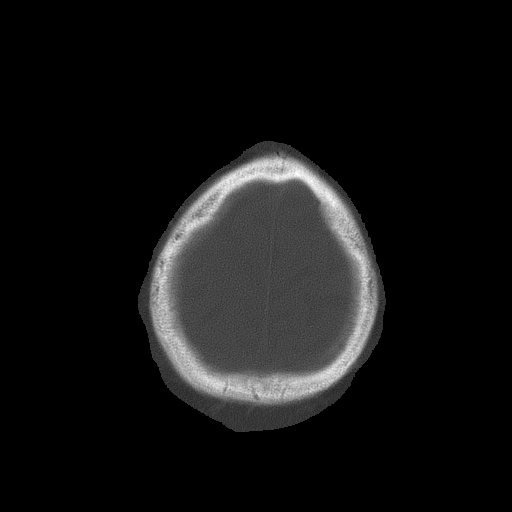
[im 63/70  bone]
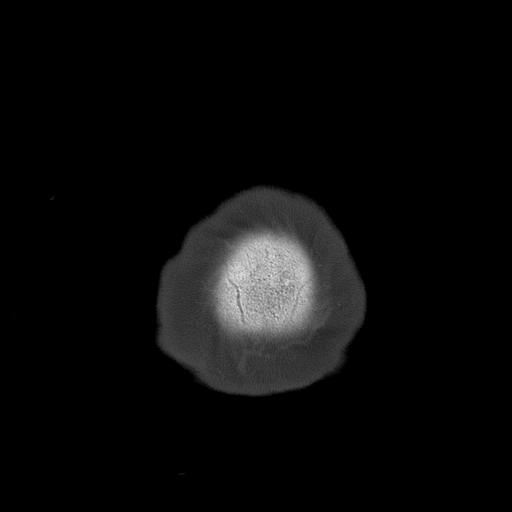

[Series 4: head wo recon · axial · 0.39mm/px · z∈[+672,+717]mm · 2 of 27 slices shown]
[im 9/27  brain]
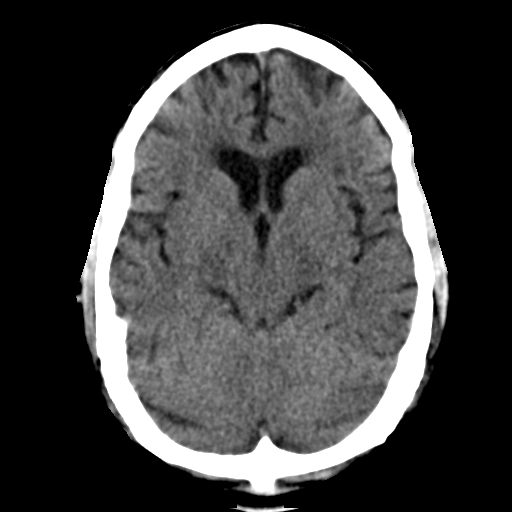
[im 18/27  brain]
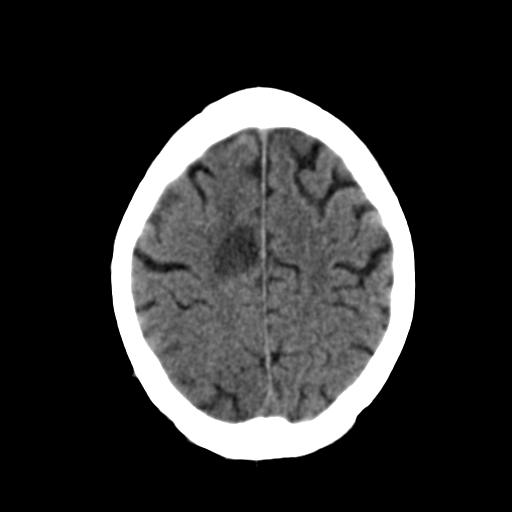

[Series 5: head bone recon · axial · 0.39mm/px · z∈[+636,+715]mm · 6 of 68 slices shown]
[im 7/68  bone]
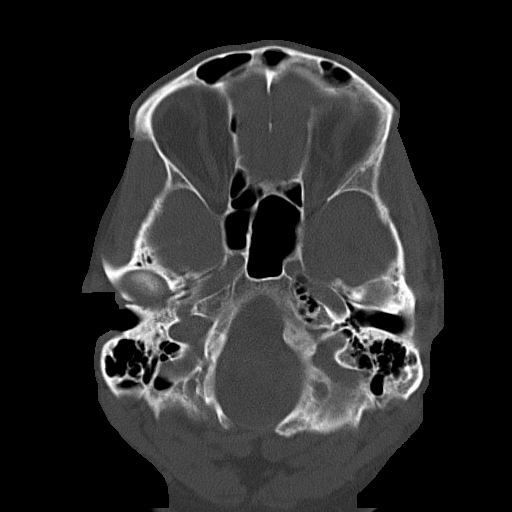
[im 14/68  bone]
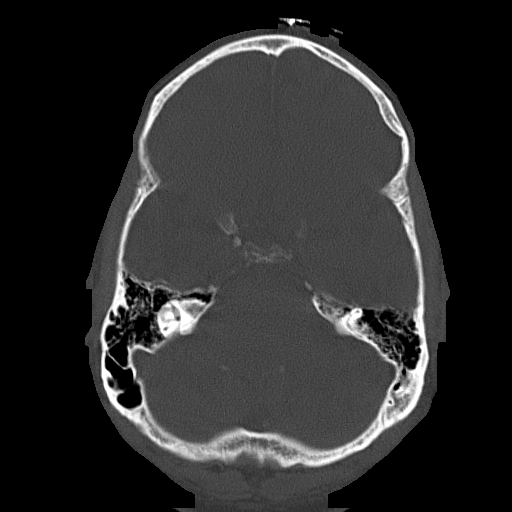
[im 21/68  bone]
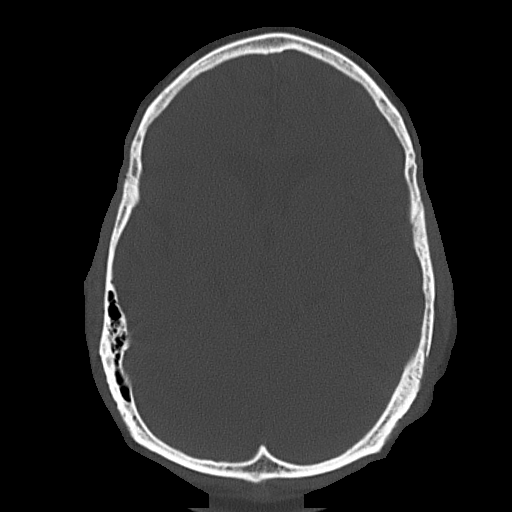
[im 27/68  bone]
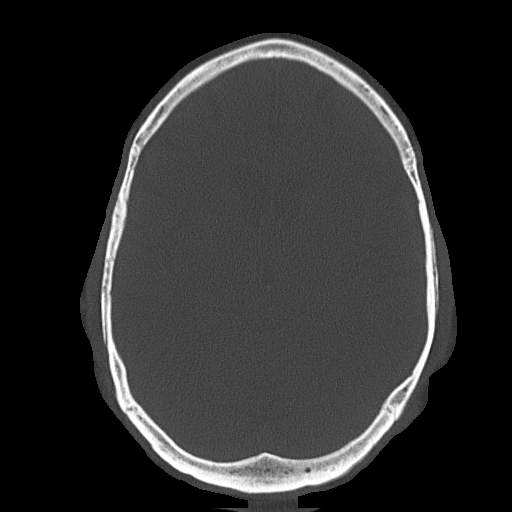
[im 41/68  bone]
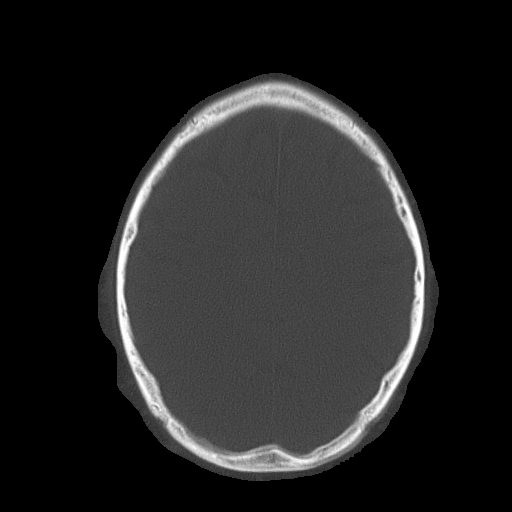
[im 47/68  bone]
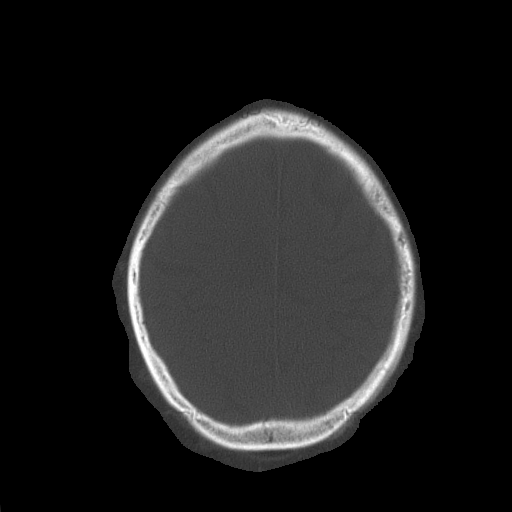

[18 of 30 positions shown; findings below may reference images not displayed]

FINDINGS: Brain: Ill-defined low-density focus is seen within the upper right
frontal lobe, near the vertex, measuring approximately 3 cm greatest
dimension, with local mass effect on adjacent sulci. No midline
shift or herniation. No other parenchymal lesion identified.

There is mild generalized brain atrophy with commensurate dilatation
of the ventricles and sulci. No parenchymal or extra-axial
hemorrhage seen.

Vascular: No hyperdense vessel or unexpected calcification. There
are chronic calcified atherosclerotic changes of the large vessels
at the skull base.

Skull: Negative for fracture or focal lesion.

Sinuses/Orbits: No acute findings.

Other: None.
IMPRESSION: 1. Ill-defined low-density focus in the upper right frontal lobe,
near the vertex, measuring approximately 3 cm greatest dimension.
Associated local mass effect on adjacent sulci but no midline shift
or herniation. Favor subacute infarction with associated edema.
Neoplastic lesion cannot be confidently excluded. Consider MRI for
further characterization.
2. No intracranial hemorrhage.

These results were called by telephone at the time of interpretation
on 05/16/2016 at [DATE] to Dr. IDAIM HESNI , who verbally
acknowledged these results.
